# Patient Record
Sex: Female | Born: 2000 | Race: White | Hispanic: No | Marital: Single | Smoking: Never smoker
Health system: Southern US, Community
[De-identification: ages and names within clinical notes are randomized; demographics above are authoritative.]

## PROBLEM LIST (undated history)

## (undated) ENCOUNTER — Inpatient Hospital Stay (HOSPITAL_COMMUNITY): Payer: Self-pay

## (undated) DIAGNOSIS — N1370 Vesicoureteral-reflux, unspecified: Secondary | ICD-10-CM

## (undated) DIAGNOSIS — B009 Herpesviral infection, unspecified: Secondary | ICD-10-CM

## (undated) DIAGNOSIS — F32A Depression, unspecified: Secondary | ICD-10-CM

## (undated) DIAGNOSIS — N200 Calculus of kidney: Secondary | ICD-10-CM

## (undated) DIAGNOSIS — F909 Attention-deficit hyperactivity disorder, unspecified type: Secondary | ICD-10-CM

## (undated) DIAGNOSIS — R569 Unspecified convulsions: Secondary | ICD-10-CM

## (undated) DIAGNOSIS — F329 Major depressive disorder, single episode, unspecified: Secondary | ICD-10-CM

## (undated) DIAGNOSIS — D649 Anemia, unspecified: Secondary | ICD-10-CM

## (undated) HISTORY — DX: Attention-deficit hyperactivity disorder, unspecified type: F90.9

## (undated) HISTORY — DX: Vesicoureteral-reflux, unspecified: N13.70

## (undated) HISTORY — PX: TONSILLECTOMY: SUR1361

## (undated) HISTORY — DX: Depression, unspecified: F32.A

## (undated) HISTORY — PX: FOOT SURGERY: SHX648

## (undated) HISTORY — DX: Herpesviral infection, unspecified: B00.9

## (undated) HISTORY — PX: OTHER SURGICAL HISTORY: SHX169

---
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
## 2021-01-07 ENCOUNTER — Telehealth: Payer: Self-pay

## 2021-01-07 ENCOUNTER — Other Ambulatory Visit: Payer: Self-pay | Admitting: Family Medicine

## 2021-01-07 ENCOUNTER — Other Ambulatory Visit: Payer: Self-pay

## 2021-01-07 ENCOUNTER — Ambulatory Visit: Payer: Medicaid Other | Attending: Family Medicine

## 2021-01-07 ENCOUNTER — Encounter: Payer: Self-pay | Admitting: Family Medicine

## 2021-01-07 DIAGNOSIS — Z3480 Encounter for supervision of other normal pregnancy, unspecified trimester: Secondary | ICD-10-CM

## 2021-01-07 NOTE — Telephone Encounter (Signed)
Patient calls nurse line requesting letter from PCP for clearance to receive dental X-ray. Patient is concerned she may have a cavity. Patient has upcoming dental appointment on Monday and they will not perform imaging without clearance from PCP.   Please advise.   Veronda Prude, RN

## 2021-01-07 NOTE — Telephone Encounter (Signed)
Letter in the letters tab per acog guidelines.  Please let patient know.

## 2021-01-10 ENCOUNTER — Other Ambulatory Visit: Payer: Self-pay | Admitting: *Deleted

## 2021-01-10 DIAGNOSIS — O43122 Velamentous insertion of umbilical cord, second trimester: Secondary | ICD-10-CM

## 2021-01-10 NOTE — Telephone Encounter (Signed)
Patient came to front office and provided with letter.   Veronda Prude, RN

## 2021-01-11 ENCOUNTER — Telehealth: Payer: Self-pay | Admitting: Family Medicine

## 2021-01-11 DIAGNOSIS — O283 Abnormal ultrasonic finding on antenatal screening of mother: Secondary | ICD-10-CM | POA: Insufficient documentation

## 2021-01-11 DIAGNOSIS — O43129 Velamentous insertion of umbilical cord, unspecified trimester: Secondary | ICD-10-CM | POA: Insufficient documentation

## 2021-01-11 DIAGNOSIS — O43122 Velamentous insertion of umbilical cord, second trimester: Secondary | ICD-10-CM

## 2021-01-11 NOTE — Telephone Encounter (Signed)
Attempted to call patient as she was recently seen by maternal fetal medicine and referred to [facility] for fetal echo.  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  Will route to FM-OB provider who is seeing patient at upcoming appointment.  I was unable to leave a voicemail as patient's voicemail not set up.

## 2021-01-13 ENCOUNTER — Other Ambulatory Visit (HOSPITAL_COMMUNITY)
Admission: RE | Admit: 2021-01-13 | Discharge: 2021-01-13 | Disposition: A | Discharge status: Home / Self Care | Payer: Medicaid Other | Source: Ambulatory Visit | Attending: Family Medicine | Admitting: Family Medicine

## 2021-01-13 ENCOUNTER — Ambulatory Visit (INDEPENDENT_AMBULATORY_CARE_PROVIDER_SITE_OTHER): Payer: Medicaid Other | Admitting: Family Medicine

## 2021-01-13 ENCOUNTER — Other Ambulatory Visit: Payer: Self-pay

## 2021-01-13 VITALS — BP 122/73 | HR 94 | Wt 199.6 lb

## 2021-01-13 DIAGNOSIS — O23592 Infection of other part of genital tract in pregnancy, second trimester: Secondary | ICD-10-CM

## 2021-01-13 DIAGNOSIS — O99012 Anemia complicating pregnancy, second trimester: Secondary | ICD-10-CM

## 2021-01-13 DIAGNOSIS — A5901 Trichomonal vulvovaginitis: Secondary | ICD-10-CM

## 2021-01-13 DIAGNOSIS — Z3480 Encounter for supervision of other normal pregnancy, unspecified trimester: Secondary | ICD-10-CM

## 2021-01-13 DIAGNOSIS — Z23 Encounter for immunization: Secondary | ICD-10-CM | POA: Diagnosis present

## 2021-01-13 DIAGNOSIS — R8271 Bacteriuria: Secondary | ICD-10-CM

## 2021-01-13 NOTE — Progress Notes (Signed)
[facility] Visit  Alyssa Howell is a 20 y.o. G1P0 at [redacted]w[redacted]d (via LMP) who presents to [facility] for routine follow up. Seen today along with medical student Alease Medina. Prenatal course, history, notes, ultrasounds, and laboratory results reviewed.  Denies cramping/ctx, fluid leaking, vaginal bleeding, or decreased fetal movement. Taking PNV. Forgetting to take aspirin 81 mg and ferrous sulfate regularly.  Reports occasional leakage from breasts bilaterally. Does not happen consistently or continuously. Often doesn't notice until she wakes up and can't tell what consistency it is. No bloody discharge.  Has noticed when she sits for a while she will occasionally have numbness and tingling in both her legs. It typically self-resolves in a few minutes but until it does she cannot bear weight on her legs. It does not bother her otherwise.  Primary Prenatal Care Provider: Dr. Anner Crete  Postpartum Plans: - delivery planning: Prefers SVD but was informed by MFM that C-section was advisable due to high risk of hemorrhage with velamentous cord insertion. - circumcision: Yes - feeding: Breast feeding - pediatrician: [facility] - contraception: Unsure. Has not had good experiences with nexplanon or OCP. Discussed possible postplacental IUD.  FHR: 150 Uterine size: 24cm  Breasts: bilateral breasts normal in appearance. No erythema, deformity, or nipple discharge. No palpable abnormal masses. No axillary lymphadenopathy. Chaperone present - Maree Erie CMA MSK: 5/5 strength, sensation and motor intact. 2+ reflexes on lower extremities bilaterally.  Assessment & Plan  1. Routine prenatal care: - flu shot given today - encouraged bivalent COVID booster, patient will consider - reassured patient re: leaking of breasts, likely colostrum, exam normal - encouraged adherence with baby aspirin for pre-E prophylaxis  2. Anemia of pregnancy: Hgb 9.5 on  8/6 - encouraged adherence with iron - check CBC & ferritin today  3. Velamentous cord insertion - getting serial growth scans with MFM, potentially will need delivery via CS - to be determined based on MFM recommendations  4. 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 Answered questions as I was able, advised she will get a lot more information at Tuesday's echo appointment.   5. Trichomonas infection - positive for trich on 8/2, s/p treatment - blind gc/chl/trich swab obtained today as test of cure  6. Leg numbness - does not seem anything severe; only with sitting, improves with standing. Patient sat through entirety of appointment but had no numbness during examination. Suspect some nerve impingement, but no concerns for cauda equina at this time given reassuring exam and lack of bladder/bowel incontinence. Patient requests letter giving her permission to sit as needed at work, which I have provided.  7. History of depression - patient denies mood concerns at present. Continue to monitor, especially postpartum.  8. History of nephrolithiasis - caused frequent MAU/ED visits earlier in pregnancy, most recently has not been a problem. Continue to monitor.  9. GBS bacteriuria - will need intrapartum ppx if vaginal delivery  Next prenatal visit in 3 weeks. Will need 1hr GTT, CBC, HIV, RPR drawn at that visit. Labor & fetal movement precautions discussed.  Patient seen along with MS3 student Alease Medina. I personally evaluated this patient along with the student, and verified all aspects of the history, physical exam, and medical decision making as documented by the student. I agree with the student's documentation and have made all necessary edits.  Levert Feinstein, MD  [facility]

## 2021-01-13 NOTE — Patient Instructions (Signed)
It was nice to meet you today!  Go to the MAU at [facility] if: You have cramping/contractions that do not go away with drinking water Your water breaks.  Sometimes it is a big gush of fluid, sometimes it is just a trickle that keeps getting your underwear wet or running down your legs You have vaginal bleeding.    You do not feel your baby moving like normal.  If you do not, get something to eat and drink and lay down and focus on feeling your baby move. If your baby is still not moving like normal, you should go to MAU.   Testing today for infections Letter given so you can sit at work Testing for anemia Flu shot today  Next visit in 3 weeks - will do diabetes test at that visit  Be well, Dr. Pollie Meyer

## 2021-01-14 LAB — CBC
Hematocrit: 32.6 % — ABNORMAL LOW (ref 34.0–46.6)
Hemoglobin: 10.8 g/dL — ABNORMAL LOW (ref 11.1–15.9)
MCH: 32.4 pg (ref 26.6–33.0)
MCHC: 33.1 g/dL (ref 31.5–35.7)
MCV: 98 fL — ABNORMAL HIGH (ref 79–97)
Platelets: 281 10*3/uL (ref 150–450)
RBC: 3.33 x10E6/uL — ABNORMAL LOW (ref 3.77–5.28)
RDW: 11.8 % (ref 11.7–15.4)
WBC: 11.8 10*3/uL — ABNORMAL HIGH (ref 3.4–10.8)

## 2021-01-14 LAB — CERVICOVAGINAL ANCILLARY ONLY
Chlamydia: NEGATIVE
Comment: NEGATIVE
Comment: NEGATIVE
Comment: NORMAL
Neisseria Gonorrhea: NEGATIVE
Trichomonas: NEGATIVE

## 2021-01-14 LAB — FERRITIN: Ferritin: 17 ng/mL (ref 15–77)

## 2021-01-21 ENCOUNTER — Telehealth: Payer: Self-pay

## 2021-01-21 DIAGNOSIS — R8271 Bacteriuria: Secondary | ICD-10-CM | POA: Insufficient documentation

## 2021-01-21 NOTE — Telephone Encounter (Signed)
Patient appt at [facility] is scheduled for 03/03/2021 @10 :00am

## 2021-01-22 ENCOUNTER — Encounter: Payer: Self-pay | Admitting: Family Medicine

## 2021-02-01 ENCOUNTER — Encounter: Payer: Self-pay | Admitting: Family Medicine

## 2021-02-01 NOTE — Telephone Encounter (Signed)
Called patient regarding mychart message. Due to severity of headache and visual changes, recommended MAU evaluation. Patient verbalizes understanding. FYI to PCP.   Veronda Prude, RN

## 2021-02-04 ENCOUNTER — Ambulatory Visit (INDEPENDENT_AMBULATORY_CARE_PROVIDER_SITE_OTHER): Payer: Medicaid Other | Admitting: Family Medicine

## 2021-02-04 ENCOUNTER — Other Ambulatory Visit: Payer: Self-pay

## 2021-02-04 VITALS — BP 122/70 | HR 90 | Wt 201.0 lb

## 2021-02-04 DIAGNOSIS — O43123 Velamentous insertion of umbilical cord, third trimester: Secondary | ICD-10-CM

## 2021-02-04 DIAGNOSIS — O283 Abnormal ultrasonic finding on antenatal screening of mother: Secondary | ICD-10-CM

## 2021-02-04 DIAGNOSIS — Z23 Encounter for immunization: Secondary | ICD-10-CM

## 2021-02-04 DIAGNOSIS — Z3480 Encounter for supervision of other normal pregnancy, unspecified trimester: Secondary | ICD-10-CM

## 2021-02-04 NOTE — Patient Instructions (Signed)
Thank you for coming in today.  Please continue to take your prenatal vitamin. Follow up in 2 weeks. Come back next Tuesday to get your 1 hour glucose test done.

## 2021-02-04 NOTE — Progress Notes (Signed)
  [facility] Prenatal Visit  Alyssa Howell is a 20 y.o. G1P0 at [redacted]w[redacted]d here for routine follow up. She is dated by LMP.  She reports no complaints. She reports fetal movement. She denies vaginal bleeding, contractions, or loss of fluid. See flow sheet for details.  Vitals:   02/04/21 1228  BP: 122/70  Pulse: 90      A/P: Pregnancy at [redacted]w[redacted]d.  Doing well.   Routine prenatal care:  Dating reviewed, dating tab is correct Fetal heart tones Appropriate Fundal height within expected range.  Infant feeding choice: Breastfeeding Contraception choice: Pills  Infant circumcision desired yes  The patient does not have a history of Cesarean delivery and no referral to Center for [facility] is indicated Influenza vaccine previously administered.   Tdap was given today. CBC, RPR, and HIV were obtained today.   1 hour glucola to be obtained 10/18; future order placed  Rh status was reviewed and patient does not need Rhogam.  Rhogam was not given today.  Pregnancy medical home and PHQ-9 forms were not done today and reviewed.   Childbirth and education classes were not offered. Pregnancy education regarding benefits of breastfeeding, contraception, fetal growth, expected weight gain, and safe infant sleep were discussed.  Preterm labor and fetal movement precautions reviewed.  2. Pregnancy issues include the following and were addressed as appropriate today:   Frequent headaches- denies vision changes or headache at this time. Discussed increased hydration. No hx of RUQ pain, nausea or vomiting. 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 Potentially needs to be delivered at [facility] according to patient, she is unsure at this time. Will have more information at next appointment.   Problem list and pregnancy box updated: Yes.   Patient scheduled in [facility] during third trimester on 02/25/21.   Follow up 2 weeks.

## 2021-02-05 ENCOUNTER — Other Ambulatory Visit: Payer: Self-pay

## 2021-02-05 ENCOUNTER — Inpatient Hospital Stay (HOSPITAL_COMMUNITY)
Admission: AD | Admit: 2021-02-05 | Discharge: 2021-02-05 | Disposition: A | Discharge status: Home / Self Care | Payer: Medicaid Other | Attending: Family Medicine | Admitting: Family Medicine

## 2021-02-05 ENCOUNTER — Inpatient Hospital Stay (HOSPITAL_COMMUNITY): Payer: Medicaid Other

## 2021-02-05 ENCOUNTER — Encounter (HOSPITAL_COMMUNITY): Payer: Self-pay | Admitting: Family Medicine

## 2021-02-05 DIAGNOSIS — O26833 Pregnancy related renal disease, third trimester: Secondary | ICD-10-CM | POA: Diagnosis not present

## 2021-02-05 DIAGNOSIS — M549 Dorsalgia, unspecified: Secondary | ICD-10-CM | POA: Diagnosis not present

## 2021-02-05 DIAGNOSIS — O99891 Other specified diseases and conditions complicating pregnancy: Secondary | ICD-10-CM

## 2021-02-05 DIAGNOSIS — Z87891 Personal history of nicotine dependence: Secondary | ICD-10-CM | POA: Insufficient documentation

## 2021-02-05 DIAGNOSIS — Z3A28 28 weeks gestation of pregnancy: Secondary | ICD-10-CM

## 2021-02-05 DIAGNOSIS — R109 Unspecified abdominal pain: Secondary | ICD-10-CM | POA: Insufficient documentation

## 2021-02-05 DIAGNOSIS — N200 Calculus of kidney: Secondary | ICD-10-CM

## 2021-02-05 DIAGNOSIS — Z3689 Encounter for other specified antenatal screening: Secondary | ICD-10-CM

## 2021-02-05 LAB — CBC
Hematocrit: 29.9 % — ABNORMAL LOW (ref 34.0–46.6)
Hemoglobin: 10.6 g/dL — ABNORMAL LOW (ref 11.1–15.9)
MCH: 33.9 pg — ABNORMAL HIGH (ref 26.6–33.0)
MCHC: 35.5 g/dL (ref 31.5–35.7)
MCV: 96 fL (ref 79–97)
Platelets: 244 10*3/uL (ref 150–450)
RBC: 3.13 x10E6/uL — ABNORMAL LOW (ref 3.77–5.28)
RDW: 11.4 % — ABNORMAL LOW (ref 11.7–15.4)
WBC: 11.5 10*3/uL — ABNORMAL HIGH (ref 3.4–10.8)

## 2021-02-05 LAB — URINALYSIS, ROUTINE W REFLEX MICROSCOPIC
Bilirubin Urine: NEGATIVE
Glucose, UA: NEGATIVE mg/dL
Hgb urine dipstick: NEGATIVE
Ketones, ur: NEGATIVE mg/dL
Nitrite: NEGATIVE
Protein, ur: NEGATIVE mg/dL
Specific Gravity, Urine: 1.019 (ref 1.005–1.030)
pH: 5 (ref 5.0–8.0)

## 2021-02-05 LAB — HIV ANTIBODY (ROUTINE TESTING W REFLEX): HIV Screen 4th Generation wRfx: NONREACTIVE

## 2021-02-05 LAB — RPR: RPR Ser Ql: NONREACTIVE

## 2021-02-05 MED ORDER — TAMSULOSIN HCL 0.4 MG PO CAPS: 0.4000 mg | ORAL_CAPSULE | Freq: Every day | ORAL | 0 refills | Status: DC

## 2021-02-05 MED ORDER — ACETAMINOPHEN-CODEINE #3 300-30 MG PO TABS: 1.0000 | ORAL_TABLET | Freq: Four times a day (QID) | ORAL | 0 refills | Status: DC | PRN

## 2021-02-05 MED ORDER — ACETAMINOPHEN-CODEINE #3 300-30 MG PO TABS: 2.0000 | ORAL_TABLET | Freq: Once | ORAL | Status: DC

## 2021-02-05 MED ORDER — OXYCODONE HCL 5 MG PO TABS
5.0000 mg | ORAL_TABLET | Freq: Once | ORAL | Status: AC
  Administered 2021-02-05: 5 mg via ORAL
  Filled 2021-02-05: qty 1

## 2021-02-05 MED ORDER — ACETAMINOPHEN 500 MG PO TABS
1000.0000 mg | ORAL_TABLET | Freq: Once | ORAL | Status: AC
  Administered 2021-02-05: 1000 mg via ORAL
  Filled 2021-02-05: qty 2

## 2021-02-05 NOTE — MAU Note (Signed)
Thinks she is getting another kidney stone on the rt side.(Rt flank pain around to front)  Started about 3 days ago.  Been having headaches the past wk, only took something 1 time, that was earlier today.  Had some blurred vision, seems to go with headaches and she gets super dizzy.

## 2021-02-05 NOTE — MAU Note (Signed)
Patient presents to MAU with pain of 7/10 on the right side radiating towards her back that she stated started "two or three days ago". Patient also states that this pain gets worse and "shoots towards the groin" when urinating. She also has been having headaches to the point of dizziness that started last week.

## 2021-02-05 NOTE — MAU Provider Note (Addendum)
History     CSN: [id number]  Arrival date and time: 02/05/21 1806   Event Date/Time   First Provider Initiated Contact with Patient 02/05/21 2034      Chief Complaint  Patient presents with   Headache   Blurred Vision   Dizziness   possible kidney stone   HPI Alyssa Howell is a 20 y.o. G1P0 at [redacted]w[redacted]d who presents with right flank pain. She states it started 2 days ago and it is constant. She rates the pain a 5/10 and has not taken anything for the pain. She states she has had kidney stones during the pregnancy and this pain is the same. She also reports having frequent headaches. Sometimes they cause blurred vision and sometimes they cause dizziness. She does not have any of the symptoms now. She reports eating well and drinking 2 bottles of water today. She denies any leaking or bleeding. Reports normal fetal movement.   OB History     Gravida  1   Para      Term      Preterm      AB      Living         SAB      IAB      Ectopic      Multiple      Live Births              Past Medical History:  Diagnosis Date   ADHD    Depression    Vesico-ureteral reflux     Past Surgical History:  Procedure Laterality Date   CYSTOSCOPY/URETEROSCOPY/HOLMIUM LASER/STENT PLACEMENT Right 08/02/2020   Procedure: CYSTOSCOPY/URETEROSCOPY/HOLMIUM LASER/STENT PLACEMENT;  Surgeon: Vanna Scotland, MD;  Location: [facility] ORS;  Service: Urology;  Laterality: Right;   TONSILLECTOMY      Family History  Problem Relation Age of Onset   Hepatitis C Mother    Valvular heart disease Mother    Healthy Father    Breast cancer Maternal Grandmother    Diabetes Other    Heart attack Other     Social History   Tobacco Use   Smoking status: Former    Types: E-cigarettes   Smokeless tobacco: Never  Vaping Use   Vaping Use: Never used  Substance Use Topics   Alcohol use: Not Currently    Comment: 1-2 times per month    Drug use: Not Currently    Types: Marijuana     Allergies: No Known Allergies  Medications Prior to Admission  Medication Sig Dispense Refill Last Dose   aspirin EC 81 MG tablet Take 1 tablet (81 mg total) by mouth daily. Swallow whole. (Patient not taking: Reported on 01/13/2021) 30 tablet 11    ferrous sulfate 325 (65 FE) MG tablet Take 1 tablet (325 mg total) by mouth daily with breakfast. (Patient not taking: Reported on 01/13/2021) 90 tablet 1    Prenatal Vit-Fe Fumarate-FA (PRENATAL MULTIVITAMIN) TABS tablet Take 1 tablet by mouth daily at 12 noon. 90 tablet 2     Review of Systems  Constitutional: Negative.  Negative for fatigue and fever.  HENT: Negative.    Respiratory: Negative.  Negative for shortness of breath.   Cardiovascular: Negative.  Negative for chest pain.  Gastrointestinal:  Positive for abdominal pain. Negative for constipation, diarrhea, nausea and vomiting.  Genitourinary:  Positive for flank pain. Negative for dysuria, vaginal bleeding and vaginal discharge.  Neurological: Negative.  Negative for dizziness and headaches.  Physical Exam   Blood pressure 104/81,  pulse (!) 102, temperature 98.8 F (37.1 C), temperature source Oral, resp. rate 18, height 5\' 6"  (1.676 m), weight 94.3 kg, last menstrual period 07/23/2020, SpO2 99 %.  Physical Exam Vitals and nursing note reviewed.  Constitutional:      General: She is not in acute distress.    Appearance: She is well-developed.  HENT:     Head: Normocephalic.  Eyes:     Pupils: Pupils are equal, round, and reactive to light.  Cardiovascular:     Rate and Rhythm: Normal rate and regular rhythm.     Heart sounds: Normal heart sounds.  Pulmonary:     Effort: Pulmonary effort is normal. No respiratory distress.     Breath sounds: Normal breath sounds.  Abdominal:     General: Bowel sounds are normal. There is no distension.     Palpations: Abdomen is soft.     Tenderness: There is no abdominal tenderness. There is right CVA tenderness.  Skin:     General: Skin is warm and dry.  Neurological:     Mental Status: She is alert and oriented to person, place, and time.  Psychiatric:        Mood and Affect: Mood normal.        Behavior: Behavior normal.        Thought Content: Thought content normal.        Judgment: Judgment normal.   Fetal Tracing:  Baseline: 150 Variability: moderate Accels: 10x10 Decels: none  Toco: none   MAU Course  Procedures Results for orders placed or performed during the hospital encounter of 02/05/21 (from the past 24 hour(s))  Urinalysis, Routine w reflex microscopic Urine, Clean Catch     Status: Abnormal   Collection Time: 02/05/21  6:46 PM  Result Value Ref Range   Color, Urine YELLOW YELLOW   APPearance HAZY (A) CLEAR   Specific Gravity, Urine 1.019 1.005 - 1.030   pH 5.0 5.0 - 8.0   Glucose, UA NEGATIVE NEGATIVE mg/dL   Hgb urine dipstick NEGATIVE NEGATIVE   Bilirubin Urine NEGATIVE NEGATIVE   Ketones, ur NEGATIVE NEGATIVE mg/dL   Protein, ur NEGATIVE NEGATIVE mg/dL   Nitrite NEGATIVE NEGATIVE   Leukocytes,Ua SMALL (A) NEGATIVE   RBC / HPF 6-10 0 - 5 RBC/hpf   WBC, UA 6-10 0 - 5 WBC/hpf   Bacteria, UA RARE (A) NONE SEEN   Squamous Epithelial / LPF 11-20 0 - 5   Mucus PRESENT     02/07/21 Renal  Result Date: 02/05/2021 CLINICAL DATA:  Right flank pain.  Twenty-eight weeks pregnant. EXAM: RENAL / URINARY TRACT ULTRASOUND COMPLETE COMPARISON:  11/27/2020 FINDINGS: Right Kidney: Renal measurements: 11.9 x 4.2 x 5.8 cm = volume: 152 mL. Multiple shadowing stones, the largest 7 mm in the upper pole. No hydronephrosis or mass. Normal echotexture. Left Kidney: Renal measurements: 11.2 x 6.2 x 5.3 cm = volume: 192 mL. Multiple stones measuring up to 6 mm. No hydronephrosis. Normal echotexture. No mass. Bladder: Appears normal for degree of bladder distention. Other: None. IMPRESSION: Bilateral nephrolithiasis.  No hydronephrosis. Electronically Signed   By: 01/27/2021 M.D.   On: 02/05/2021 21:47      MDM UA 02/07/2021 Renal Tylenol PO  Care turned over to [place] CNM at 2100.  2101, CNM 02/05/21 8:57 PM   Assessment and Plan   Reassessment (10:10 PM)  Results return positive for bilateral stones. Provider to bedside to discuss results. Patient informed of medications for management. Rx  for flomax and tylenol 3 sent to pharmacy on file.  Patient requests pain medication prior to discharge. Will give oxycodone 5mg  now. Patient reports she has all supplies necessary  No questions or concerns. Instructed to follow up with urologist and primary office. Encouraged to call primary office or return to MAU if symptoms worsen or with the onset of new symptoms. Discharged to home in stable condition.  MSN, CNM Advanced Practice Provider, [facility]

## 2021-02-07 ENCOUNTER — Encounter (HOSPITAL_COMMUNITY): Payer: Self-pay | Admitting: Family Medicine

## 2021-02-07 ENCOUNTER — Inpatient Hospital Stay (HOSPITAL_COMMUNITY): Payer: Medicaid Other

## 2021-02-07 ENCOUNTER — Encounter (HOSPITAL_COMMUNITY)
Admission: AD | Disposition: A | Discharge status: Home / Self Care | Payer: Self-pay | Source: Home / Self Care | Attending: Family Medicine

## 2021-02-07 ENCOUNTER — Inpatient Hospital Stay (HOSPITAL_COMMUNITY): Payer: Medicaid Other | Admitting: Certified Registered"

## 2021-02-07 ENCOUNTER — Inpatient Hospital Stay (HOSPITAL_COMMUNITY)
Admission: AD | Admit: 2021-02-07 | Discharge: 2021-02-07 | Disposition: A | Discharge status: Home / Self Care | Payer: Medicaid Other | Attending: Family Medicine | Admitting: Family Medicine

## 2021-02-07 DIAGNOSIS — Z538 Procedure and treatment not carried out for other reasons: Secondary | ICD-10-CM | POA: Insufficient documentation

## 2021-02-07 DIAGNOSIS — O43123 Velamentous insertion of umbilical cord, third trimester: Secondary | ICD-10-CM | POA: Diagnosis not present

## 2021-02-07 DIAGNOSIS — Z20822 Contact with and (suspected) exposure to covid-19: Secondary | ICD-10-CM | POA: Diagnosis not present

## 2021-02-07 DIAGNOSIS — Z419 Encounter for procedure for purposes other than remedying health state, unspecified: Secondary | ICD-10-CM

## 2021-02-07 DIAGNOSIS — Z87442 Personal history of urinary calculi: Secondary | ICD-10-CM | POA: Diagnosis not present

## 2021-02-07 DIAGNOSIS — N200 Calculus of kidney: Secondary | ICD-10-CM | POA: Insufficient documentation

## 2021-02-07 DIAGNOSIS — O26833 Pregnancy related renal disease, third trimester: Secondary | ICD-10-CM | POA: Diagnosis present

## 2021-02-07 DIAGNOSIS — Z79899 Other long term (current) drug therapy: Secondary | ICD-10-CM | POA: Diagnosis not present

## 2021-02-07 DIAGNOSIS — Z3A28 28 weeks gestation of pregnancy: Secondary | ICD-10-CM

## 2021-02-07 DIAGNOSIS — Z87891 Personal history of nicotine dependence: Secondary | ICD-10-CM | POA: Diagnosis not present

## 2021-02-07 LAB — URINALYSIS, ROUTINE W REFLEX MICROSCOPIC
Bacteria, UA: NONE SEEN
Bilirubin Urine: NEGATIVE
Glucose, UA: NEGATIVE mg/dL
Ketones, ur: NEGATIVE mg/dL
Nitrite: NEGATIVE
Protein, ur: NEGATIVE mg/dL
Specific Gravity, Urine: 1.013 (ref 1.005–1.030)
Squamous Epithelial / HPF: >50 — ABNORMAL HIGH (ref 0–5)
WBC, UA: >50 WBC/hpf — ABNORMAL HIGH (ref 0–5)
pH: 6 (ref 5.0–8.0)

## 2021-02-07 LAB — CBC
HCT: 33.5 % — ABNORMAL LOW (ref 36.0–46.0)
Hemoglobin: 11 g/dL — ABNORMAL LOW (ref 12.0–15.0)
MCH: 33.5 pg (ref 26.0–34.0)
MCHC: 32.8 g/dL (ref 30.0–36.0)
MCV: 102.1 fL — ABNORMAL HIGH (ref 80.0–100.0)
Platelets: 241 10*3/uL (ref 150–400)
RBC: 3.28 MIL/uL — ABNORMAL LOW (ref 3.87–5.11)
RDW: 12.3 % (ref 11.5–15.5)
WBC: 12.5 10*3/uL — ABNORMAL HIGH (ref 4.0–10.5)
nRBC: 0 % (ref 0.0–0.2)

## 2021-02-07 LAB — COMPREHENSIVE METABOLIC PANEL
ALT: 11 U/L (ref 0–44)
AST: 17 U/L (ref 15–41)
Albumin: 2.8 g/dL — ABNORMAL LOW (ref 3.5–5.0)
Alkaline Phosphatase: 81 U/L (ref 38–126)
Anion gap: 9 (ref 5–15)
BUN: 9 mg/dL (ref 6–20)
CO2: 23 mmol/L (ref 22–32)
Calcium: 9 mg/dL (ref 8.9–10.3)
Chloride: 105 mmol/L (ref 98–111)
Creatinine, Ser: 0.74 mg/dL (ref 0.44–1.00)
GFR, Estimated: >60 mL/min (ref >60)
Glucose, Bld: 82 mg/dL (ref 70–99)
Potassium: 4 mmol/L (ref 3.5–5.1)
Sodium: 137 mmol/L (ref 135–145)
Total Bilirubin: 0.3 mg/dL (ref 0.3–1.2)
Total Protein: 6.5 g/dL (ref 6.5–8.1)

## 2021-02-07 LAB — RESP PANEL BY RT-PCR (FLU A&B, COVID) ARPGX2
Influenza A by PCR: NEGATIVE
Influenza B by PCR: NEGATIVE
SARS Coronavirus 2 by RT PCR: NEGATIVE

## 2021-02-07 SURGERY — CANCELLED PROCEDURE
Anesthesia: General | Laterality: Left

## 2021-02-07 MED ORDER — ONDANSETRON HCL 4 MG/2ML IJ SOLN
4.0000 mg | Freq: Once | INTRAMUSCULAR | Status: AC
  Administered 2021-02-07: 4 mg via INTRAVENOUS
  Filled 2021-02-07: qty 2

## 2021-02-07 MED ORDER — OXYCODONE-ACETAMINOPHEN 5-325 MG PO TABS: 1.0000 | ORAL_TABLET | Freq: Four times a day (QID) | ORAL | 0 refills | Status: DC | PRN

## 2021-02-07 MED ORDER — LACTATED RINGERS IV SOLN: INTRAVENOUS | Status: DC

## 2021-02-07 MED ORDER — ONDANSETRON HCL 4 MG PO TABS: 4.0000 mg | ORAL_TABLET | Freq: Three times a day (TID) | ORAL | 1 refills | Status: DC | PRN

## 2021-02-07 MED ORDER — ONDANSETRON HCL 4 MG/2ML IJ SOLN: 4.0000 mg | Freq: Once | INTRAMUSCULAR | Status: AC

## 2021-02-07 MED ORDER — MIDAZOLAM HCL 2 MG/2ML IJ SOLN
INTRAMUSCULAR | Status: AC
  Filled 2021-02-07: qty 2

## 2021-02-07 MED ORDER — FENTANYL CITRATE (PF) 100 MCG/2ML IJ SOLN
INTRAMUSCULAR | Status: AC
  Administered 2021-02-07: 50 ug via INTRAVENOUS
  Filled 2021-02-07: qty 2

## 2021-02-07 MED ORDER — LACTATED RINGERS IV BOLUS
1000.0000 mL | Freq: Once | INTRAVENOUS | Status: AC
  Administered 2021-02-07: 1000 mL via INTRAVENOUS

## 2021-02-07 MED ORDER — FENTANYL CITRATE (PF) 100 MCG/2ML IJ SOLN: 50.0000 ug | INTRAMUSCULAR | Status: DC | PRN

## 2021-02-07 MED ORDER — ONDANSETRON HCL 4 MG/2ML IJ SOLN
INTRAMUSCULAR | Status: AC
  Administered 2021-02-07: 4 mg via INTRAVENOUS
  Filled 2021-02-07: qty 2

## 2021-02-07 MED ORDER — HYDROMORPHONE HCL 1 MG/ML IJ SOLN
1.0000 mg | Freq: Once | INTRAMUSCULAR | Status: AC
Start: 2021-02-07 — End: 2021-02-07
  Administered 2021-02-07: 1 mg via INTRAVENOUS
  Filled 2021-02-07: qty 1

## 2021-02-07 MED ORDER — FENTANYL CITRATE (PF) 250 MCG/5ML IJ SOLN
INTRAMUSCULAR | Status: AC
  Filled 2021-02-07: qty 5

## 2021-02-07 NOTE — MAU Provider Note (Addendum)
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 She was seen here 2 days ago, diagnosed with nonobstructive nephrolithiasis and sent home on pain medication and flomax. She returns today with increasing pain and  vomiting. She has not been able to keep down any foods or liquids this morning. Pain comes and goes in waves. Denies fevers, chills. Pain is located in her lower thoracic area.  OB History     Gravida  1   Para      Term      Preterm      AB      Living         SAB      IAB      Ectopic      Multiple      Live Births              Past Medical History:  Diagnosis Date   ADHD    Depression    Vesico-ureteral reflux     Past Surgical History:  Procedure Laterality Date   CYSTOSCOPY/URETEROSCOPY/HOLMIUM LASER/STENT PLACEMENT Right 08/02/2020   Procedure: CYSTOSCOPY/URETEROSCOPY/HOLMIUM LASER/STENT PLACEMENT;  Surgeon: Vanna Scotland, MD;  Location: [facility] ORS;  Service: Urology;  Laterality: Right;   TONSILLECTOMY      Family History  Problem Relation Age of Onset   Hepatitis C Mother    Valvular heart disease Mother    Healthy Father    Breast cancer Maternal Grandmother    Diabetes Other    Heart attack Other     Social History   Tobacco Use   Smoking status: Former    Types: E-cigarettes   Smokeless tobacco: Never  Vaping Use   Vaping Use: Never used  Substance Use Topics   Alcohol use: Not Currently   Drug use: Not Currently    Types: Marijuana    Comment: last used the end of September 2022 as of 02/07/2021    Allergies: No Known Allergies  Medications Prior to Admission  Medication Sig Dispense Refill  Last Dose   acetaminophen-codeine (TYLENOL #3) 300-30 MG tablet Take 1-2 tablets by mouth every 6 (six) hours as needed for moderate pain. 6 tablet 0 02/07/2021 at 0430   ondansetron (ZOFRAN) 8 MG tablet Take by mouth every 8 (eight) hours as needed for nausea or vomiting.   02/06/2021 at 2130   Prenatal Vit-Fe Fumarate-FA (PRENATAL MULTIVITAMIN) TABS tablet Take 1 tablet by mouth daily at 12 noon. 90 tablet 2 02/06/2021   tamsulosin (FLOMAX) 0.4 MG CAPS capsule Take 1 capsule (0.4 mg total) by mouth daily. 30 capsule 0 02/07/2021 at 0430   aspirin EC 81 MG tablet Take 1 tablet (81 mg total) by mouth daily. Swallow whole. (Patient not taking: Reported on 01/13/2021) 30 tablet 11    ferrous sulfate 325 (65 FE) MG tablet Take 1 tablet (325 mg total) by mouth daily with breakfast. (Patient not taking: Reported on 01/13/2021) 90 tablet 1     Review of Systems Physical Exam   Blood pressure (!) 110/56, pulse 78, temperature 98.9 F (37.2 C), temperature source Oral, resp. rate 17, height 5\' 6"  (1.676 m), weight  94.8 kg, last menstrual period 07/23/2020, SpO2 100 %.  Physical Exam Vitals reviewed.  Constitutional:      Appearance: Normal appearance.  Cardiovascular:     Rate and Rhythm: Normal rate and regular rhythm.     Pulses: Normal pulses.  Pulmonary:     Effort: Pulmonary effort is normal.  Abdominal:     General: Abdomen is flat. There is no distension.     Palpations: Abdomen is soft.     Tenderness: There is no abdominal tenderness. There is right CVA tenderness and left CVA tenderness. There is no guarding or rebound.  Skin:    General: Skin is warm and dry.  Neurological:     Mental Status: She is alert.  Psychiatric:        Mood and Affect: Mood normal.        Behavior: Behavior normal.        Thought Content: Thought content normal.        Judgment: Judgment normal.   Results for orders placed or performed during the hospital encounter of 02/07/21 (from the past 24 hour(s))   Urinalysis, Routine w reflex microscopic Urine, Clean Catch     Status: Abnormal   Collection Time: 02/07/21  9:55 AM  Result Value Ref Range   Color, Urine YELLOW YELLOW   APPearance CLOUDY (A) CLEAR   Specific Gravity, Urine 1.013 1.005 - 1.030   pH 6.0 5.0 - 8.0   Glucose, UA NEGATIVE NEGATIVE mg/dL   Hgb urine dipstick SMALL (A) NEGATIVE   Bilirubin Urine NEGATIVE NEGATIVE   Ketones, ur NEGATIVE NEGATIVE mg/dL   Protein, ur NEGATIVE NEGATIVE mg/dL   Nitrite NEGATIVE NEGATIVE   Leukocytes,Ua LARGE (A) NEGATIVE   RBC / HPF 21-50 0 - 5 RBC/hpf   WBC, UA >50 (H) 0 - 5 WBC/hpf   Bacteria, UA NONE SEEN NONE SEEN   Squamous Epithelial / LPF >50 (H) 0 - 5   Mucus PRESENT    Non Squamous Epithelial 0-5 (A) NONE SEEN  Comprehensive metabolic panel     Status: Abnormal   Collection Time: 02/07/21 10:13 AM  Result Value Ref Range   Sodium 137 135 - 145 mmol/L   Potassium 4.0 3.5 - 5.1 mmol/L   Chloride 105 98 - 111 mmol/L   CO2 23 22 - 32 mmol/L   Glucose, Bld 82 70 - 99 mg/dL   BUN 9 6 - 20 mg/dL   Creatinine, Ser 3.08 0.44 - 1.00 mg/dL   Calcium 9.0 8.9 - 65.7 mg/dL   Total Protein 6.5 6.5 - 8.1 g/dL   Albumin 2.8 (L) 3.5 - 5.0 g/dL   AST 17 15 - 41 U/L   ALT 11 0 - 44 U/L   Alkaline Phosphatase 81 38 - 126 U/L   Total Bilirubin 0.3 0.3 - 1.2 mg/dL   GFR, Estimated >84 >69 mL/min   Anion gap 9 5 - 15  CBC     Status: Abnormal   Collection Time: 02/07/21 10:13 AM  Result Value Ref Range   WBC 12.5 (H) 4.0 - 10.5 K/uL   RBC 3.28 (L) 3.87 - 5.11 MIL/uL   Hemoglobin 11.0 (L) 12.0 - 15.0 g/dL   HCT 62.9 (L) 52.8 - 41.3 %   MCV 102.1 (H) 80.0 - 100.0 fL   MCH 33.5 26.0 - 34.0 pg   MCHC 32.8 30.0 - 36.0 g/dL   RDW 24.4 01.0 - 27.2 %   Platelets 241 150 - 400 K/uL  nRBC 0.0 0.0 - 0.2 %   US RENAL  Result Date: 02/07/2021 CLINICAL DATA:  Pain.  History of nephrolithiasis.  Gravid state. EXAM: RENAL / URINARY TRACT ULTRASOUND COMPLETE COMPARISON:  February 05, 2021 FINDINGS:  Right Kidney: Renal measurements: 11.6 x 4.8 x 5.7 cm = volume: 168 mL. There is mild RIGHT-sided pelviectasis. Echogenicity within normal limits. No mass visualized. There are multiple echogenic foci most consistent with nephrolithiasis visualized in the kidney. The largest is approximately 6 mm and is located in the interpolar kidney. Left Kidney: Renal measurements: 10.6 x 6.2 x 5.1 cm = volume: 177 mL. Echogenicity within normal limits. No mass or hydronephrosis visualized. There are multiple echogenic foci most consistent with nephrolithiasis visualized within the kidney, the largest of which measures 6 mm in the interpolar kidney. Bladder: Appears normal for degree of bladder distention. Other: None. IMPRESSION: 1. There is mild RIGHT-sided pelviectasis. No definitive obstructing nephrolithiasis is visualized. This could be due to gravid state. Consider follow-up ultrasound if persistent symptomatology. 2. Multiple bilateral nephrolithiasis. Electronically Signed   By: Meda Klinefelter M.D.   On: 02/07/2021 12:02     MAU Course  Procedures NST:  Baseline: 130  Variability: moderate Accelerations: 10x10, possible 15x15.  Decelerations: none Contractions: none  MDM Discussed patient with Dr Arita Miss with Urology. She offered to place a ureteral stent, which the patient accepted. Pt NPO since 5am. Will prepare patient for OR. Will go home following procedure.  Assessment and Plan   1. [redacted] weeks gestation of pregnancy   2. Nephrolithiasis    To OR  Levie Heritage 02/07/2021, 10:12 AM   After patient went to Short Stay, Dr Arita Miss requested CT scan. CT scan showed no obstructing stone, felt that Stent probably not helpful. Will adjust pain medication, add antiemetics, and follow up outpatient.  Levie Heritage, DO 02/07/2021 4:51 PM

## 2021-02-07 NOTE — MAU Note (Signed)
Short Stay called to inform that transport will be sent for patient as they are ready. Informed RN to call Short Stay RN to give report.  Report given to Short Stay Charge Nurse.

## 2021-02-07 NOTE — MAU Note (Signed)
Was here on Saturday, dx with bilateral kidney stones.  Was given pain medication, but can't keep it down.  The pain is now on both sides.  She was at work this morning and just started throwing up.

## 2021-02-07 NOTE — Progress Notes (Signed)
Received phone call from Dr. Peterson Ao needs CT scan prior to surgery.  OR desk notified. Dr. Arita Miss at bedside, pt agreeable to plan. Dr. Arita Miss filled out informed consent for Low Dose CT scan. Consent at bedside.

## 2021-02-07 NOTE — Progress Notes (Signed)
CT transport here to take pt for scan

## 2021-02-07 NOTE — Progress Notes (Signed)
Surgery not needed per Dr. Arita Miss. Dr. Adrian Blackwater with OB called, discharge instructions provided.  New prescriptions sent directly to pharmacy.  All pt's question's answered. Pt to follow up with urology outpt.  Per Dr. Adrian Blackwater, only a BP needed prior to d/c and does not need any fetal monitoring before leaving.  BP 109/63, pain level 2.  Cup of water and pretzels provided, tolerating PO's.   IV removed. Belongings received.  Mother to transport home.  Pt escorted out via WC.

## 2021-02-07 NOTE — Progress Notes (Signed)
Pt c/o 8/10 R flank pain and nausea. Dr. Bradley Ferris made aware, placing orders.  Awaiting CT results.

## 2021-02-07 NOTE — Consult Note (Signed)
I have been asked to see the patient by Dr. Candelaria Celeste, for evaluation and management of urolithiasis.  History of present illness: 20 year old woman with history of urolithiasis currently [redacted] weeks pregnant presented to [facility] with worsening bilateral flank pain associated with nausea and vomiting.  Renal ultrasound shows mild right pelvic caliectasis and bilateral nonobstructing renal calculi.  Patient reports undergoing ureteroscopy with stent placement in April 2022 by [facility].  She has had symptoms waxing and waning since then.  She was told that she has nonobstructing renal calculi.  The pain medication she is using for kidney stones is causing nausea and vomiting.  No fever or chills.   Review of systems: A 12 point comprehensive review of systems was obtained and is negative unless otherwise stated in the history of present illness.  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   Current Outpatient Medications on File Prior to Encounter  Medication Sig Dispense Refill   acetaminophen-codeine (TYLENOL #3) 300-30 MG tablet Take 1-2 tablets by mouth every 6 (six) hours as needed for moderate pain. 6 tablet 0   ondansetron (ZOFRAN) 8 MG tablet Take by mouth every 8 (eight) hours as needed for nausea or vomiting.     Prenatal Vit-Fe Fumarate-FA (PRENATAL MULTIVITAMIN) TABS tablet Take 1 tablet by mouth daily at 12 noon. 90 tablet 2   tamsulosin (FLOMAX) 0.4 MG CAPS capsule Take 1 capsule (0.4 mg total) by mouth daily. 30 capsule 0    aspirin EC 81 MG tablet Take 1 tablet (81 mg total) by mouth daily. Swallow whole. (Patient not taking: Reported on 01/13/2021) 30 tablet 11   ferrous sulfate 325 (65 FE) MG tablet Take 1 tablet (325 mg total) by mouth daily with breakfast. (Patient not taking: Reported on 01/13/2021) 90 tablet 1    Past Medical History:  Diagnosis Date   ADHD    Depression    Vesico-ureteral reflux     Past Surgical History:  Procedure Laterality Date   CYSTOSCOPY/URETEROSCOPY/HOLMIUM LASER/STENT PLACEMENT Right 08/02/2020   Procedure: CYSTOSCOPY/URETEROSCOPY/HOLMIUM LASER/STENT PLACEMENT;  Surgeon: Vanna Scotland, MD;  Location: [facility] ORS;  Service: Urology;  Laterality: Right;   TONSILLECTOMY      Social History   Tobacco Use   Smoking status: Former    Types: E-cigarettes   Smokeless tobacco: Never  Vaping Use   Vaping Use: Never used  Substance Use Topics   Alcohol use: Not Currently   Drug use: Not Currently    Types: Marijuana    Comment: last used the end of September 2022 as of 02/07/2021    Family History  Problem Relation Age of Onset   Hepatitis C Mother    Valvular heart disease Mother    Healthy Father    Breast cancer Maternal Grandmother    Diabetes Other    Heart attack Other     PE: Vitals:   02/07/21 0930 02/07/21 0952  BP: 110/62 (!) 110/56  Pulse: 82 78  Resp: 17   Temp: 98.9 F (37.2 C)   TempSrc: Oral   SpO2: 100% 100%  Weight: 94.8 kg  Height: 5\' 6"  (1.676 m)    Patient appears to be in no acute distress  patient is alert and oriented x3 Atraumatic normocephalic head No increased work of breathing, no audible wheezes/rhonchi Abdomen is soft, pregnant abdomen, mild right CVA ttp Lower extremities are symmetric without appreciable edema Grossly neurologically intact No identifiable skin lesions  Recent Labs    02/07/21 1013  WBC 12.5*  HGB 11.0*  HCT 33.5*   Recent Labs    02/07/21 1013  NA 137  K 4.0  CL 105  CO2 23  GLUCOSE 82  BUN  9  CREATININE 0.74  CALCIUM 9.0   No results for input(s): LABPT, INR in the last 72 hours. No results for input(s): LABURIN in the last 72 hours. Results for orders placed or performed in visit on 11/23/20  Microscopic Examination     Status: Abnormal   Collection Time: 11/23/20  3:32 PM   Urine  Result Value Ref Range Status   WBC, UA 6-10 (A) 0 - 5 /hpf Final   RBC 11-30 (A) 0 - 2 /hpf Final   Epithelial Cells (non renal) 0-10 0 - 10 /hpf Final   Bacteria, UA None seen None seen/Few Final   Trichomonas, UA Present (A) None seen Final  CULTURE, URINE COMPREHENSIVE     Status: None   Collection Time: 11/23/20  4:20 PM   Specimen: Urine   UR  Result Value Ref Range Status   Urine Culture, Comprehensive Final report  Final   Organism ID, Bacteria Comment  Final    Comment: Mixed urogenital flora 1,000 Colonies/mL     Imaging: Renal 01/23/21 02/07/21 IMPRESSION: 1. There is mild RIGHT-sided pelviectasis. No definitive obstructing nephrolithiasis is visualized. This could be due to gravid state. Consider follow-up ultrasound if persistent symptomatology. 2. Multiple bilateral nephrolithiasis.     Electronically Signed   By: 02/09/21 M.D.   On: 02/07/2021 12:02  CT Abd/Pelvis 02/07/21 IMPRESSION: Patient has multiple small bilateral renal calculi, markedly increased since April of this year when no stones were seen on the right and there was only 1 identifiable stone on the left.   There is no evidence of a stone in the right ureter or the bladder. Phlebolith in the right pelvis was present on the prior exam. Therefore, right renal collecting system and ureter fullness could either be due to extrinsic compression of the ureter by the gravid uterus or could relate to a recently passed stone.     Electronically Signed   By: May M.D.   On: 02/07/2021 16:11  Imp/Recommendations: Urolithiasis: Recommend low-dose CT scan to evaluate for obstructing  ureteral calculi.   -CT the abdomen pelvis did not reveal any ureteral calculi -Patient does have bilateral nonobstructing renal calculi that can be addressed as an outpatient after she delivers the baby -Discussed CT scan findings with patient and that surgery is not indicated in absence of ureteral calculi; stents will not help her in this case and only be a source of encrustation and stone formation -[facility] to be scheduled  with any further questions or concerns. Naoko Diperna D Aerabella Galasso

## 2021-02-07 NOTE — Anesthesia Preprocedure Evaluation (Deleted)
Anesthesia Evaluation    Reviewed: Allergy & Precautions, Patient's Chart, lab work & pertinent test results  Airway        Dental   Pulmonary neg pulmonary ROS, former smoker,           Cardiovascular negative cardio ROS       Neuro/Psych PSYCHIATRIC DISORDERS Depression negative neurological ROS     GI/Hepatic negative GI ROS, Neg liver ROS,   Endo/Other  negative endocrine ROS  Renal/GU negative Renal ROS     Musculoskeletal negative musculoskeletal ROS (+)   Abdominal (+) + obese,   Peds  (+) ADHD Hematology  (+) anemia ,   Anesthesia Other Findings Nephrolithiasis  Reproductive/Obstetrics (+) Pregnancy                             Anesthesia Physical Anesthesia Plan  ASA: 2  Anesthesia Plan: General   Post-op Pain Management:    Induction: Intravenous and Rapid sequence  PONV Risk Score and Plan: 3 and Ondansetron, Dexamethasone and Treatment may vary due to age or medical condition  Airway Management Planned: Oral ETT  Additional Equipment:   Intra-op Plan:   Post-operative Plan: Extubation in OR  Informed Consent:   Plan Discussed with:   Anesthesia Plan Comments:         Anesthesia Quick Evaluation

## 2021-02-08 ENCOUNTER — Other Ambulatory Visit: Payer: Medicaid Other

## 2021-02-08 ENCOUNTER — Ambulatory Visit: Payer: Medicaid Other | Attending: Obstetrics

## 2021-02-08 ENCOUNTER — Ambulatory Visit: Payer: Medicaid Other | Admitting: *Deleted

## 2021-02-08 ENCOUNTER — Other Ambulatory Visit: Payer: Self-pay

## 2021-02-08 ENCOUNTER — Other Ambulatory Visit: Payer: Self-pay | Admitting: *Deleted

## 2021-02-08 ENCOUNTER — Encounter: Payer: Self-pay | Admitting: *Deleted

## 2021-02-08 VITALS — BP 121/72 | HR 89

## 2021-02-08 DIAGNOSIS — Z3480 Encounter for supervision of other normal pregnancy, unspecified trimester: Secondary | ICD-10-CM

## 2021-02-08 DIAGNOSIS — Z3689 Encounter for other specified antenatal screening: Secondary | ICD-10-CM | POA: Insufficient documentation

## 2021-02-08 DIAGNOSIS — Z3A28 28 weeks gestation of pregnancy: Secondary | ICD-10-CM

## 2021-02-08 DIAGNOSIS — E669 Obesity, unspecified: Secondary | ICD-10-CM | POA: Diagnosis not present

## 2021-02-08 DIAGNOSIS — O99213 Obesity complicating pregnancy, third trimester: Secondary | ICD-10-CM

## 2021-02-08 DIAGNOSIS — O43122 Velamentous insertion of umbilical cord, second trimester: Secondary | ICD-10-CM | POA: Diagnosis present

## 2021-02-08 DIAGNOSIS — O43123 Velamentous insertion of umbilical cord, third trimester: Secondary | ICD-10-CM

## 2021-02-09 LAB — GLUCOSE TOLERANCE, 1 HOUR: Glucose, 1Hr PP: 133 mg/dL (ref 70–199)

## 2021-02-25 ENCOUNTER — Other Ambulatory Visit: Payer: Self-pay

## 2021-02-25 ENCOUNTER — Ambulatory Visit (INDEPENDENT_AMBULATORY_CARE_PROVIDER_SITE_OTHER): Payer: Medicaid Other | Admitting: Family Medicine

## 2021-02-25 ENCOUNTER — Encounter: Payer: Self-pay | Admitting: Family Medicine

## 2021-02-25 VITALS — BP 115/74 | HR 110 | Wt 209.0 lb

## 2021-02-25 DIAGNOSIS — Z3480 Encounter for supervision of other normal pregnancy, unspecified trimester: Secondary | ICD-10-CM | POA: Diagnosis not present

## 2021-02-25 DIAGNOSIS — O43123 Velamentous insertion of umbilical cord, third trimester: Secondary | ICD-10-CM

## 2021-02-25 DIAGNOSIS — O283 Abnormal ultrasonic finding on antenatal screening of mother: Secondary | ICD-10-CM

## 2021-02-25 MED ORDER — FAMOTIDINE 40 MG PO TABS: 40.0000 mg | ORAL_TABLET | Freq: Two times a day (BID) | ORAL | 0 refills | Status: DC

## 2021-02-25 NOTE — Assessment & Plan Note (Signed)
Has repeat fetal echocardiogram upcoming. Alyssa Howell would be closer to family for delivery---recommended family discuss with Pediatric Cardiology and MFM. Referred to [facility].

## 2021-02-25 NOTE — Progress Notes (Signed)
  [facility] Prenatal Visit  Alyssa Howell is a 20 y.o. G1P0 at [redacted]w[redacted]d here for routine follow up. She is dated by LMP.  She reports heartburn.  She has stopped taking Tums with her history of kidney stones and wonders what she can take now.  She reports her appetites been okay.  She reports her back pain is improved.  She is concerned about her baby's aortic arch abnormality and her velamentous cord.  Has follow up with Peds Cardiology and MFM. She reports fetal movement. She denies vaginal bleeding, contractions, or loss of fluid.  She has stopped smoking cannabis. She is not taking oxycodone.  See flow sheet for details.  Vitals:   02/25/21 0954  BP: 115/74  Pulse: (!) 110   FHT 145  FH 30 cm    A/P: Pregnancy at [redacted]w[redacted]d.  Doing well.   Routine prenatal care:  Dating reviewed, dating tab is correct Fetal heart tones: Appropriate Fundal height: within expected range.  The patient does not have a history of HSV and valacyclovir is not indicated at this time.  The patient has a velamentous cord and thus was referred to [facility] for recommendations.  Infant feeding choice: Breastfeeding Contraception choice: Pills  Influenza vaccine previously administered.   Tdap was not given today. COVID vaccination was discussed and deferred, continue to discuss .  Pregnancy education regarding benefits of breastfeeding, contraception, fetal growth, expected weight gain, and safe infant sleep were discussed.  Preterm labor and fetal movement precautions reviewed.   2. Pregnancy issues include the following and were addressed as appropriate today: Recommended H2 blocker for GERD. Continue prenatal vitamin.      Velamentous cord insertion-requiring monitoring and growth scans. 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 Discussed at length  with patient and mother. As this can take some time to get a visit, recommended follow up in 2 weeks here for ongoing care and ensure she is NOT lost to follow up. Per discussion with Dr. Pray/Dr. Pollie Meyer, patient should follow up with Ob given above complicatoins.      Problem list and pregnancy box updated: Yes.    Follow up 2 weeks.  Terisa Starr, MD  Family Medicine Teaching Service

## 2021-02-25 NOTE — Patient Instructions (Addendum)
It was wonderful to see you today.  Please bring ALL of your medications with you to every visit.   Today we talked about:  ---Call the High Risk Obstetricians    [phone]   Thank you for choosing [facility].   Please call [phone] with any questions about today's appointment.  Please be sure to schedule follow up at the front  desk before you leave today.   Terisa Starr, MD  Family Medicine

## 2021-02-25 NOTE — Assessment & Plan Note (Signed)
Discussed difference between this and vasa previa with patient--follow up with MFM scheduled. Will need both BPP and growths per antenatal guidelines. Referred to [facility].

## 2021-03-05 ENCOUNTER — Encounter: Payer: Self-pay | Admitting: Family Medicine

## 2021-03-07 ENCOUNTER — Ambulatory Visit (INDEPENDENT_AMBULATORY_CARE_PROVIDER_SITE_OTHER): Payer: Medicaid Other | Admitting: Obstetrics and Gynecology

## 2021-03-07 ENCOUNTER — Other Ambulatory Visit: Payer: Self-pay

## 2021-03-07 VITALS — BP 118/74 | HR 92 | Wt 216.5 lb

## 2021-03-07 DIAGNOSIS — Z3480 Encounter for supervision of other normal pregnancy, unspecified trimester: Secondary | ICD-10-CM

## 2021-03-07 DIAGNOSIS — O283 Abnormal ultrasonic finding on antenatal screening of mother: Secondary | ICD-10-CM

## 2021-03-07 DIAGNOSIS — R309 Painful micturition, unspecified: Secondary | ICD-10-CM

## 2021-03-07 DIAGNOSIS — Z3A32 32 weeks gestation of pregnancy: Secondary | ICD-10-CM | POA: Diagnosis not present

## 2021-03-07 DIAGNOSIS — O2343 Unspecified infection of urinary tract in pregnancy, third trimester: Secondary | ICD-10-CM | POA: Insufficient documentation

## 2021-03-07 DIAGNOSIS — R8271 Bacteriuria: Secondary | ICD-10-CM

## 2021-03-07 DIAGNOSIS — O2340 Unspecified infection of urinary tract in pregnancy, unspecified trimester: Secondary | ICD-10-CM | POA: Insufficient documentation

## 2021-03-07 DIAGNOSIS — O43123 Velamentous insertion of umbilical cord, third trimester: Secondary | ICD-10-CM

## 2021-03-07 DIAGNOSIS — N200 Calculus of kidney: Secondary | ICD-10-CM

## 2021-03-07 LAB — POCT URINALYSIS DIPSTICK OB
Bilirubin, UA: NEGATIVE
Glucose, UA: NEGATIVE
Nitrite, UA: NEGATIVE
Spec Grav, UA: 1.01 (ref 1.010–1.025)
Urobilinogen, UA: 0.2 E.U./dL
pH, UA: 7 (ref 5.0–8.0)

## 2021-03-07 MED ORDER — CEPHALEXIN 500 MG PO CAPS
500.0000 mg | ORAL_CAPSULE | Freq: Four times a day (QID) | ORAL | 0 refills | Status: AC
Start: 2021-03-07 — End: 2021-03-12

## 2021-03-07 NOTE — Progress Notes (Signed)
PRENATAL VISIT NOTE  Subjective:  Alyssa Howell is a 20 y.o. G1P0 at [redacted]w[redacted]d being seen today for ongoing prenatal care.  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  She will receive further care and likely delivery at [facility]  Patient doing well with no acute concerns today. She reports  back and lower right leg pain along with dysuria .  Contractions: Not present. Vag. Bleeding: None.  Movement: Present. Denies leaking of fluid.   The following portions of the patient's history were reviewed and updated as appropriate: allergies, current medications, past family history, past medical history, past social history, past surgical history and problem list. Problem list updated.  Objective:   Vitals:   03/07/21 1434  BP: 118/74  Pulse: 92  Weight: 216 lb 8 oz (98.2 kg)    Fetal Status: Fetal Heart Rate (bpm): 154   Movement: Present     General:  Alert, oriented and cooperative. Patient is in no acute distress.  Skin: Skin is warm and dry. No rash noted.   Cardiovascular: Normal heart rate noted  Respiratory: Normal respiratory effort, no problems with respiration noted  Abdomen: Soft, gravid, appropriate for gestational age.  Pain/Pressure: Absent     Pelvic: Cervical exam deferred        Extremities: Normal range of motion.  Edema: None  Mental Status:  Normal mood and affect. Normal behavior. Normal judgment and thought content.   Assessment and Plan:  Pregnancy: G1P0  at [redacted]w[redacted]d  1. Supervision of other normal pregnancy, antepartum Continue routine care  - Culture, OB Urine - POC Urinalysis Dipstick OB  2. Painful urination  - Culture, OB Urine - POC Urinalysis Dipstick OB  3. Velamentous insertion of umbilical cord in third trimester Growth scan on 03/08/21  4. 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 due to needed cardiac specialists  5. GBS bacteriuria Treat in labor  6. [redacted] weeks gestation of pregnancy   7. UTI (urinary tract infection) during pregnancy, third trimester Will empirically treat, urine culture pending, could also be issue with recurrent kidney stones. - cephALEXin (KEFLEX) 500 MG capsule; Take 1 capsule (500 mg total) by mouth 4 (four) times daily for 5 days.  Dispense: 20 capsule; Refill: 0  8. Nephrolithiasis Pt advised to inform urology team of current symptoms in case a new treatment is needed.  - cephALEXin (KEFLEX) 500 MG capsule; Take 1 capsule (500 mg total) by mouth 4 (four) times daily for 5 days.  Dispense: 20 capsule; Refill: 0  Preterm labor symptoms and general obstetric precautions including but not limited to vaginal bleeding, contractions, leaking of fluid and fetal movement were reviewed in detail with the patient.  Please refer to After Visit Summary for other counseling recommendations.   Return in about 2 weeks (around 03/21/2021) for [facility], in person.  Appointment is pending, pt to transfer her care to [facility] due to need for  delivery there.   Mariel Aloe, MD Faculty Attending Center for [facility]

## 2021-03-08 ENCOUNTER — Encounter: Payer: Self-pay | Admitting: *Deleted

## 2021-03-08 ENCOUNTER — Ambulatory Visit: Payer: Medicaid Other | Admitting: *Deleted

## 2021-03-08 ENCOUNTER — Ambulatory Visit: Payer: Medicaid Other | Attending: Obstetrics and Gynecology

## 2021-03-08 VITALS — BP 111/70 | HR 110

## 2021-03-08 DIAGNOSIS — O99213 Obesity complicating pregnancy, third trimester: Secondary | ICD-10-CM | POA: Diagnosis not present

## 2021-03-08 DIAGNOSIS — O358XX0 Maternal care for other (suspected) fetal abnormality and damage, not applicable or unspecified: Secondary | ICD-10-CM | POA: Diagnosis not present

## 2021-03-08 DIAGNOSIS — E669 Obesity, unspecified: Secondary | ICD-10-CM | POA: Diagnosis not present

## 2021-03-08 DIAGNOSIS — O43123 Velamentous insertion of umbilical cord, third trimester: Secondary | ICD-10-CM | POA: Diagnosis not present

## 2021-03-08 DIAGNOSIS — Z3A32 32 weeks gestation of pregnancy: Secondary | ICD-10-CM

## 2021-03-08 DIAGNOSIS — Z6830 Body mass index (BMI) 30.0-30.9, adult: Secondary | ICD-10-CM | POA: Diagnosis present

## 2021-03-09 ENCOUNTER — Other Ambulatory Visit: Payer: Self-pay | Admitting: *Deleted

## 2021-03-09 DIAGNOSIS — O43123 Velamentous insertion of umbilical cord, third trimester: Secondary | ICD-10-CM

## 2021-03-09 LAB — CULTURE, OB URINE

## 2021-03-09 LAB — URINE CULTURE, OB REFLEX

## 2021-03-11 ENCOUNTER — Ambulatory Visit (INDEPENDENT_AMBULATORY_CARE_PROVIDER_SITE_OTHER): Payer: Medicaid Other | Admitting: Family Medicine

## 2021-03-11 ENCOUNTER — Encounter: Payer: Self-pay | Admitting: Family Medicine

## 2021-03-11 ENCOUNTER — Other Ambulatory Visit: Payer: Self-pay

## 2021-03-11 VITALS — BP 108/60 | HR 105 | Ht 66.0 in | Wt 214.5 lb

## 2021-03-11 DIAGNOSIS — Z23 Encounter for immunization: Secondary | ICD-10-CM

## 2021-03-11 DIAGNOSIS — Z0000 Encounter for general adult medical examination without abnormal findings: Secondary | ICD-10-CM | POA: Diagnosis not present

## 2021-03-11 NOTE — Patient Instructions (Addendum)
It was wonderful to see you today.  Please bring ALL of your medications with you to every visit.   Today we talked about:  - Start iron every other day   - You can follow up after your pregnancy - Follow up after your mom's wedding for your COVID booster     Thank you for choosing [facility].   Please call [phone] with any questions about today's appointment.  Please be sure to schedule follow up at the front  desk before you leave today.   Terisa Starr, MD  Family Medicine

## 2021-03-11 NOTE — Progress Notes (Signed)
    SUBJECTIVE:   CHIEF COMPLAINT: right leg pain,  HPI:   Alyssa Howell is a 20 y.o. G1P0 at [redacted]w[redacted]d with pregnancy complicaned by velamentous cord insertion and double aortic arch presenting for right leg pain and cramps in legs  She reports reflux is better. She is taking Tums or famotidine a few times a week. She admits to forgetting prenatal vitals. She often has right sided back pain and cramps in both legs at night. Feels like she constantly needs to move legs. No edema, redness, chest pain, dyspnea, numbness, weakness, falls.   Endorses good movement. No LOF or bleeding. No contractions.   PERTINENT  PMH / PSH/Family/Social History :Living with mother, who is getting married tomorrow Has boyfriend FOB is incarcerated  She is on FMLA leave   OBJECTIVE:   BP 108/60   Pulse (!) 116   Ht 5\' 6"  (1.676 m)   Wt 214 lb 8 oz (97.3 kg)   LMP 07/23/2020 (Exact Date)   SpO2 99%   BMI 34.62 kg/m   Today's weight:  Last Weight  Most recent update: 03/11/2021 10:08 AM    Weight  97.3 kg (214 lb 8 oz)            Review of prior weights: 03/13/2021   03/11/21 1007  Weight: 214 lb 8 oz (97.3 kg)   Gravid uterus  FHT 147 bpm  FH 32 cm   MSK Normal gait + right sided SI pain, no lower extremity edema, normal strength and sensation   ASSESSMENT/PLAN:   Restless legs, ferritin of 17, recommended every other day iron. Consider repeat with MFM. She will follow with MFM for remainder of pregnancy  Social Stressors, patient has supportive family and plans for postpartum period. Already on FMLA, resources offered. She will reach out if additional support services needed. Suspect infant will need to follow with Pediatrician (such as [facility])  HCM COVID next week--she would like to wait until after mother's wedding to obtain booster, discusssed      03/13/21, MD  Family Medicine Teaching Service  [facility]

## 2021-03-14 ENCOUNTER — Ambulatory Visit (INDEPENDENT_AMBULATORY_CARE_PROVIDER_SITE_OTHER): Payer: Medicaid Other

## 2021-03-14 ENCOUNTER — Other Ambulatory Visit: Payer: Self-pay

## 2021-03-14 DIAGNOSIS — Z23 Encounter for immunization: Secondary | ICD-10-CM

## 2021-03-21 ENCOUNTER — Ambulatory Visit (INDEPENDENT_AMBULATORY_CARE_PROVIDER_SITE_OTHER): Payer: Medicaid Other | Admitting: Obstetrics and Gynecology

## 2021-03-21 ENCOUNTER — Encounter: Payer: Self-pay | Admitting: Obstetrics and Gynecology

## 2021-03-21 ENCOUNTER — Other Ambulatory Visit: Payer: Self-pay

## 2021-03-21 VITALS — BP 106/75 | HR 114 | Wt 218.0 lb

## 2021-03-21 DIAGNOSIS — R8271 Bacteriuria: Secondary | ICD-10-CM

## 2021-03-21 DIAGNOSIS — Z3480 Encounter for supervision of other normal pregnancy, unspecified trimester: Secondary | ICD-10-CM

## 2021-03-21 DIAGNOSIS — O43123 Velamentous insertion of umbilical cord, third trimester: Secondary | ICD-10-CM

## 2021-03-21 DIAGNOSIS — O283 Abnormal ultrasonic finding on antenatal screening of mother: Secondary | ICD-10-CM

## 2021-03-21 NOTE — Progress Notes (Signed)
   PRENATAL VISIT NOTE  Subjective:  Alyssa Howell is a 20 y.o. G1P0 at [redacted]w[redacted]d being seen today for ongoing prenatal care.  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  Patient reports no complaints.  Contractions: Not present. Vag. Bleeding: None.  Movement: Present. Denies leaking of fluid.   The following portions of the patient's history were reviewed and updated as appropriate: allergies, current medications, past family history, past medical history, past social history, past surgical history and problem list.   Objective:   Vitals:   03/21/21 1029  BP: 106/75  Pulse: (!) 114  Weight: 218 lb (98.9 kg)    Fetal Status: Fetal Heart Rate (bpm): 155 Fundal Height: 34 cm Movement: Present     General:  Alert, oriented and cooperative. Patient is in no acute distress.  Skin: Skin is warm and dry. No rash noted.   Cardiovascular: Normal heart rate noted  Respiratory: Normal respiratory effort, no problems with respiration noted  Abdomen: Soft, gravid, appropriate for gestational age.  Pain/Pressure: Present     Pelvic: Cervical exam deferred        Extremities: Normal range of motion.     Mental Status: Normal mood and affect. Normal behavior. Normal judgment and thought content.   Assessment and Plan:  Pregnancy: G1P0 at [redacted]w[redacted]d 1. Supervision of other normal pregnancy, antepartum Patient is doing well Plans birth control pills for contraception  2. Velamentous insertion of umbilical cord in third trimester Follow up growth ultrasound on 12/12  3. GBS  bacteriuria Prophylaxis in labor  4. 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 Please refer to After Visit Summary for other counseling recommendations.   Return in about 2 weeks (around 04/04/2021) for in person, ROB, High risk.  Future Appointments  Date Time Provider Department Center  04/04/2021  3:30 PM [facility] NURSE [facility]  04/04/2021  3:45 PM [facility] US6 [facility]  08/31/2021  1:30 PM Vanna Scotland, MD [facility] None    Catalina Antigua, MD

## 2021-03-21 NOTE — Progress Notes (Signed)
Pt has appt this Wed with [facility] about delivery and NICU care.  Pt is having right sided pain, mostly at night.

## 2021-03-22 ENCOUNTER — Encounter: Payer: Self-pay | Admitting: Family Medicine

## 2021-03-22 NOTE — Telephone Encounter (Signed)
I called patient to discuss. Patient reports leaking of "some type of fluid." Patient reports its clear and looks like "water." Patient reports she does not think it is urine. Patient reports decreased fetal movement today. Patient reports she has not felt the baby move since this morning. Patient reports a lot of pressure in her abdomen. Patient denies bleeding.   Patient advised to go to [facility] for evaluation. Patient advised to also call her OB on call as she is followed by HR. Patient agreed with plan.

## 2021-03-23 NOTE — Telephone Encounter (Signed)
Agree with [facility] evaluation.

## 2021-04-04 ENCOUNTER — Ambulatory Visit (INDEPENDENT_AMBULATORY_CARE_PROVIDER_SITE_OTHER): Payer: Medicaid Other | Admitting: Obstetrics and Gynecology

## 2021-04-04 ENCOUNTER — Other Ambulatory Visit: Payer: Self-pay

## 2021-04-04 ENCOUNTER — Encounter: Payer: Self-pay | Admitting: Obstetrics and Gynecology

## 2021-04-04 ENCOUNTER — Ambulatory Visit: Payer: Medicaid Other | Attending: Obstetrics and Gynecology

## 2021-04-04 ENCOUNTER — Ambulatory Visit: Payer: Medicaid Other | Admitting: *Deleted

## 2021-04-04 ENCOUNTER — Encounter: Payer: Self-pay | Admitting: *Deleted

## 2021-04-04 VITALS — BP 118/70 | HR 108

## 2021-04-04 VITALS — BP 113/77 | HR 109 | Wt 223.0 lb

## 2021-04-04 DIAGNOSIS — O99323 Drug use complicating pregnancy, third trimester: Secondary | ICD-10-CM

## 2021-04-04 DIAGNOSIS — Z3480 Encounter for supervision of other normal pregnancy, unspecified trimester: Secondary | ICD-10-CM

## 2021-04-04 DIAGNOSIS — O43123 Velamentous insertion of umbilical cord, third trimester: Secondary | ICD-10-CM

## 2021-04-04 DIAGNOSIS — O99213 Obesity complicating pregnancy, third trimester: Secondary | ICD-10-CM | POA: Diagnosis not present

## 2021-04-04 DIAGNOSIS — E669 Obesity, unspecified: Secondary | ICD-10-CM

## 2021-04-04 DIAGNOSIS — O283 Abnormal ultrasonic finding on antenatal screening of mother: Secondary | ICD-10-CM

## 2021-04-04 DIAGNOSIS — Q2545 Double aortic arch: Secondary | ICD-10-CM | POA: Insufficient documentation

## 2021-04-04 DIAGNOSIS — O35BXX0 Maternal care for other (suspected) fetal abnormality and damage, fetal cardiac anomalies, not applicable or unspecified: Secondary | ICD-10-CM | POA: Diagnosis not present

## 2021-04-04 DIAGNOSIS — Z3A36 36 weeks gestation of pregnancy: Secondary | ICD-10-CM

## 2021-04-04 DIAGNOSIS — R8271 Bacteriuria: Secondary | ICD-10-CM

## 2021-04-04 NOTE — Progress Notes (Signed)
ROB 36.3 wks GC/CC today? For TOC at [facility] on Friday. GBS + urine previously, swabbed again at [facility] (neg)  Reports ride side facial swelling 2 days ago, face was tender, denies dental or sinus pain, or new products.  IOL at [facility] 04/21/21

## 2021-04-04 NOTE — Progress Notes (Signed)
   PRENATAL VISIT NOTE  Subjective:  Alyssa Howell is a 20 y.o. G1P0 at [redacted]w[redacted]d being seen today for ongoing prenatal care.  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  Patient reports no complaints.  Contractions: Not present. Vag. Bleeding: Scant.  Movement: Present. Denies leaking of fluid.   The following portions of the patient's history were reviewed and updated as appropriate: allergies, current medications, past family history, past medical history, past social history, past surgical history and problem list.   Objective:   Vitals:   04/04/21 1329  BP: 113/77  Pulse: (!) 109  Weight: 223 lb (101.2 kg)    Fetal Status: Fetal Heart Rate (bpm): 160 (Simultaneous filing. User may not have seen previous data.) Fundal Height: 36 cm Movement: Present     General:  Alert, oriented and cooperative. Patient is in no acute distress.  Skin: Skin is warm and dry. No rash noted.   Cardiovascular: Normal heart rate noted  Respiratory: Normal respiratory effort, no problems with respiration noted  Abdomen: Soft, gravid, appropriate for gestational age.  Pain/Pressure: Present     Pelvic: Cervical exam deferred        Extremities: Normal range of motion.  Edema: None  Mental Status: Normal mood and affect. Normal behavior. Normal judgment and thought content.   Assessment and Plan:  Pregnancy: G1P0 at [redacted]w[redacted]d 1. Supervision of other normal pregnancy, antepartum Patient is doing well without complaints Had cultures collected by [facility]- Patient is scheduled to follow up with them  on Friday  2. Velamentous insertion of umbilical cord in third trimester Follow up ultrasound today  3. GBS bacteriuria Prophylaxis in labor  4. 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 Please refer to After Visit Summary for other counseling recommendations.   Return in about 1 week (around 04/11/2021) for in person, ROB, High risk.  Future Appointments  Date Time Provider Department Center  04/04/2021  3:30 PM [facility] NURSE [facility]  04/04/2021  3:45 PM [facility] US6 [facility]  08/31/2021  1:30 PM Vanna Scotland, MD [facility] None    Catalina Antigua, MD

## 2021-04-11 ENCOUNTER — Encounter: Payer: Medicaid Other | Admitting: Obstetrics and Gynecology

## 2021-04-15 ENCOUNTER — Encounter (HOSPITAL_COMMUNITY): Payer: Self-pay | Admitting: Obstetrics and Gynecology

## 2021-04-15 ENCOUNTER — Inpatient Hospital Stay (HOSPITAL_COMMUNITY)
Admission: AD | Admit: 2021-04-15 | Discharge: 2021-04-15 | Disposition: A | Discharge status: Home / Self Care | Payer: Medicaid Other | Attending: Obstetrics and Gynecology | Admitting: Obstetrics and Gynecology

## 2021-04-15 ENCOUNTER — Other Ambulatory Visit: Payer: Self-pay

## 2021-04-15 DIAGNOSIS — O26893 Other specified pregnancy related conditions, third trimester: Secondary | ICD-10-CM | POA: Insufficient documentation

## 2021-04-15 DIAGNOSIS — O2343 Unspecified infection of urinary tract in pregnancy, third trimester: Secondary | ICD-10-CM | POA: Diagnosis not present

## 2021-04-15 DIAGNOSIS — O26833 Pregnancy related renal disease, third trimester: Secondary | ICD-10-CM | POA: Diagnosis not present

## 2021-04-15 DIAGNOSIS — Z3A38 38 weeks gestation of pregnancy: Secondary | ICD-10-CM | POA: Diagnosis not present

## 2021-04-15 DIAGNOSIS — N200 Calculus of kidney: Secondary | ICD-10-CM | POA: Diagnosis not present

## 2021-04-15 DIAGNOSIS — R109 Unspecified abdominal pain: Secondary | ICD-10-CM | POA: Insufficient documentation

## 2021-04-15 HISTORY — DX: Vesicoureteral-reflux, unspecified: N13.70

## 2021-04-15 HISTORY — DX: Calculus of kidney: N20.0

## 2021-04-15 LAB — URINALYSIS, ROUTINE W REFLEX MICROSCOPIC
Bilirubin Urine: NEGATIVE
Glucose, UA: NEGATIVE mg/dL
Ketones, ur: NEGATIVE mg/dL
Nitrite: POSITIVE — AB
Protein, ur: NEGATIVE mg/dL
Specific Gravity, Urine: 1.025 (ref 1.005–1.030)
pH: 6 (ref 5.0–8.0)

## 2021-04-15 LAB — URINALYSIS, MICROSCOPIC (REFLEX): Squamous Epithelial / HPF: >50 (ref 0–5)

## 2021-04-15 LAB — CBC WITH DIFFERENTIAL/PLATELET
Abs Immature Granulocytes: 0.1 10*3/uL — ABNORMAL HIGH (ref 0.00–0.07)
Basophils Absolute: 0.1 10*3/uL (ref 0.0–0.1)
Basophils Relative: 0 %
Eosinophils Absolute: 0 10*3/uL (ref 0.0–0.5)
Eosinophils Relative: 0 %
HCT: 30.4 % — ABNORMAL LOW (ref 36.0–46.0)
Hemoglobin: 10.3 g/dL — ABNORMAL LOW (ref 12.0–15.0)
Immature Granulocytes: 1 %
Lymphocytes Relative: 11 %
Lymphs Abs: 1.4 10*3/uL (ref 0.7–4.0)
MCH: 33.3 pg (ref 26.0–34.0)
MCHC: 33.9 g/dL (ref 30.0–36.0)
MCV: 98.4 fL (ref 80.0–100.0)
Monocytes Absolute: 1.1 10*3/uL — ABNORMAL HIGH (ref 0.1–1.0)
Monocytes Relative: 9 %
Neutro Abs: 10 10*3/uL — ABNORMAL HIGH (ref 1.7–7.7)
Neutrophils Relative %: 79 %
Platelets: 221 10*3/uL (ref 150–400)
RBC: 3.09 MIL/uL — ABNORMAL LOW (ref 3.87–5.11)
RDW: 13.2 % (ref 11.5–15.5)
WBC: 12.7 10*3/uL — ABNORMAL HIGH (ref 4.0–10.5)
nRBC: 0 % (ref 0.0–0.2)

## 2021-04-15 MED ORDER — CEFADROXIL 500 MG PO CAPS: 500.0000 mg | ORAL_CAPSULE | Freq: Two times a day (BID) | ORAL | 0 refills | Status: AC

## 2021-04-15 MED ORDER — ONDANSETRON HCL 4 MG PO TABS: 4.0000 mg | ORAL_TABLET | Freq: Three times a day (TID) | ORAL | 0 refills | Status: DC | PRN

## 2021-04-15 MED ORDER — MORPHINE SULFATE (PF) 4 MG/ML IV SOLN
4.0000 mg | Freq: Once | INTRAVENOUS | Status: AC
  Administered 2021-04-15: 15:00:00 4 mg via INTRAVENOUS
  Filled 2021-04-15: qty 1

## 2021-04-15 MED ORDER — TAMSULOSIN HCL 0.4 MG PO CAPS: 0.4000 mg | ORAL_CAPSULE | Freq: Every day | ORAL | 0 refills | Status: DC

## 2021-04-15 MED ORDER — SODIUM CHLORIDE 0.9 % IV BOLUS
1000.0000 mL | Freq: Once | INTRAVENOUS | Status: AC
  Administered 2021-04-15: 15:00:00 1000 mL via INTRAVENOUS

## 2021-04-15 MED ORDER — PHENAZOPYRIDINE HCL 200 MG PO TABS: 200.0000 mg | ORAL_TABLET | Freq: Three times a day (TID) | ORAL | 0 refills | Status: DC | PRN

## 2021-04-15 MED ORDER — SODIUM CHLORIDE 0.9 % IV SOLN
2.0000 g | Freq: Once | INTRAVENOUS | Status: AC
  Administered 2021-04-15: 16:00:00 2 g via INTRAVENOUS
  Filled 2021-04-15: qty 20

## 2021-04-15 MED ORDER — OXYCODONE-ACETAMINOPHEN 5-325 MG PO TABS
1.0000 | ORAL_TABLET | Freq: Four times a day (QID) | ORAL | 0 refills | Status: DC | PRN
Start: 2021-04-15 — End: 2021-12-28

## 2021-04-15 NOTE — MAU Note (Signed)
Presents with c/o dysuria, hematuria, and back pain.  States called provider @ [facility] where she gets [facility], instructed to go to ER secondary urine culture hasn't resulted.  Reports had a fever, took Tylenol prior to checking temp.  Temp 99 when checked.  Endorses +FM.  Denies VB or LOF.

## 2021-04-15 NOTE — MAU Provider Note (Signed)
Chief Complaint:  Dysuria and Back Pain   Event Date/Time   First Provider Initiated Contact with Patient 04/15/21 1444      HPI: Alyssa Howell is a 20 y.o. G1P0 at [redacted]w[redacted]d by LMP with known renal calculi and fetal cardiac abnormality with delivery planned at [facility] who presents to maternity admissions reporting onset of dysuria and sharp lower back pain after urinating.  She reports feeling feverish with chills but took Tylenol before taking her temperature which was 99.9 at home. She denies n/v or other symptoms. She reports her kidney stones have not caused pain in a month or two and this pain started suddenly in the last few days. She had urinalysis at her OB visit at [facility] showing positive nitrites but the culture is still pending and she was not prescribed any antibiotics.   She reports good fetal movement, denies regular contractions, LOF, or vaginal bleeding.  IOL is planned on 04/21/21 at [facility].     Location: low back, left flank Quality: sharp Severity: 8/10 on pain scale Duration: 3-4 days Timing: intermittent Modifying factors: none Associated signs and symptoms: dysuria, chills at home  HPI  Past Medical History: Past Medical History:  Diagnosis Date   ADHD    Depression    Kidney stones    Urinary reflux    Vesico-ureteral reflux     Past obstetric history: OB History  Gravida Para Term Preterm AB Living  1            SAB IAB Ectopic Multiple Live Births               # Outcome Date GA Lbr Len/2nd Weight Sex Delivery Anes PTL Lv  1 Current             Past Surgical History: Past Surgical History:  Procedure Laterality Date   CYSTOSCOPY/URETEROSCOPY/HOLMIUM LASER/STENT PLACEMENT Right 08/02/2020   Procedure: CYSTOSCOPY/URETEROSCOPY/HOLMIUM LASER/STENT PLACEMENT;  Surgeon: Hollice Espy, MD;  Location: [facility] ORS;  Service: Urology;  Laterality: Right;   kidney stones     TONSILLECTOMY      Family History: Family History  Problem Relation Age of Onset    Hepatitis C Mother    Valvular heart disease Mother    Healthy Father    Breast cancer Maternal Grandmother    Diabetes Other    Heart attack Other     Social History: Social History   Tobacco Use   Smoking status: Never   Smokeless tobacco: Never  Vaping Use   Vaping Use: Never used  Substance Use Topics   Alcohol use: Not Currently   Drug use: Not Currently    Types: Marijuana    Comment: last used the end of September 2022 as of 02/07/2021    Allergies:  Allergies  Allergen Reactions   Abilify [Aripiprazole] Other (See Comments)    Seizures    Meds:  Medications Prior to Admission  Medication Sig Dispense Refill Last Dose   famotidine (PEPCID) 40 MG tablet Take 1 tablet (40 mg total) by mouth 2 (two) times daily. 60 tablet 0    Prenatal Vit-Fe Fumarate-FA (PRENATAL MULTIVITAMIN) TABS tablet Take 1 tablet by mouth daily at 12 noon. (Patient taking differently: Take 1 tablet by mouth daily at 12 noon. Taking OTC gummies) 90 tablet 2    [DISCONTINUED] tamsulosin (FLOMAX) 0.4 MG CAPS capsule Take 1 capsule (0.4 mg total) by mouth daily. (Patient not taking: Reported on 03/07/2021) 30 capsule 0     ROS:  Review of  Systems  Constitutional:  Negative for chills, fatigue and fever.  Eyes:  Negative for visual disturbance.  Respiratory:  Negative for shortness of breath.   Cardiovascular:  Negative for chest pain.  Gastrointestinal:  Negative for abdominal pain, nausea and vomiting.  Genitourinary:  Positive for dysuria and flank pain. Negative for difficulty urinating, pelvic pain, vaginal bleeding, vaginal discharge and vaginal pain.  Musculoskeletal:  Positive for back pain.  Neurological:  Negative for dizziness and headaches.  Psychiatric/Behavioral: Negative.      I have reviewed patient's Past Medical Hx, Surgical Hx, Family Hx, Social Hx, medications and allergies.   Physical Exam  Patient Vitals for the past 24 hrs:  BP Temp Temp src Pulse Resp SpO2 Height  Weight  04/15/21 1658 129/81 -- -- (!) 113 -- -- -- --  04/15/21 1401 113/78 98.1 F (36.7 C) Oral (!) 124 19 100 % -- --  04/15/21 1352 -- -- -- -- -- -- 5\' 7"  (1.702 m) 102.4 kg   Constitutional: Well-developed, well-nourished female in no acute distress.  Cardiovascular: normal rate Respiratory: normal effort GI: Abd soft, non-tender, gravid appropriate for gestational age.  MS: Extremities nontender, no edema, normal ROM Neurologic: Alert and oriented x 4.  GU: Neg CVAT.  PELVIC EXAM: deferred     FHT:  Baseline 150 , moderate variability, accelerations present, no decelerations Contractions: none on toco or to palpation   Labs: Results for orders placed or performed during the hospital encounter of 04/15/21 (from the past 24 hour(s))  Urinalysis, Routine w reflex microscopic Urine, Clean Catch     Status: Abnormal   Collection Time: 04/15/21  2:06 PM  Result Value Ref Range   Color, Urine YELLOW YELLOW   APPearance CLOUDY (A) CLEAR   Specific Gravity, Urine 1.025 1.005 - 1.030   pH 6.0 5.0 - 8.0   Glucose, UA NEGATIVE NEGATIVE mg/dL   Hgb urine dipstick TRACE (A) NEGATIVE   Bilirubin Urine NEGATIVE NEGATIVE   Ketones, ur NEGATIVE NEGATIVE mg/dL   Protein, ur NEGATIVE NEGATIVE mg/dL   Nitrite POSITIVE (A) NEGATIVE   Leukocytes,Ua SMALL (A) NEGATIVE  Urinalysis, Microscopic (reflex)     Status: Abnormal   Collection Time: 04/15/21  2:06 PM  Result Value Ref Range   RBC / HPF 11-20 0 - 5 RBC/hpf   WBC, UA 21-50 0 - 5 WBC/hpf   Bacteria, UA MANY (A) NONE SEEN   Squamous Epithelial / LPF >50 0 - 5   Mucus PRESENT   CBC with Differential     Status: Abnormal   Collection Time: 04/15/21  3:22 PM  Result Value Ref Range   WBC 12.7 (H) 4.0 - 10.5 K/uL   RBC 3.09 (L) 3.87 - 5.11 MIL/uL   Hemoglobin 10.3 (L) 12.0 - 15.0 g/dL   HCT 30.4 (L) 36.0 - 46.0 %   MCV 98.4 80.0 - 100.0 fL   MCH 33.3 26.0 - 34.0 pg   MCHC 33.9 30.0 - 36.0 g/dL   RDW 13.2 11.5 - 15.5 %    Platelets 221 150 - 400 K/uL   nRBC 0.0 0.0 - 0.2 %   Neutrophils Relative % 79 %   Neutro Abs 10.0 (H) 1.7 - 7.7 K/uL   Lymphocytes Relative 11 %   Lymphs Abs 1.4 0.7 - 4.0 K/uL   Monocytes Relative 9 %   Monocytes Absolute 1.1 (H) 0.1 - 1.0 K/uL   Eosinophils Relative 0 %   Eosinophils Absolute 0.0 0.0 - 0.5 K/uL  Basophils Relative 0 %   Basophils Absolute 0.1 0.0 - 0.1 K/uL   Immature Granulocytes 1 %   Abs Immature Granulocytes 0.10 (H) 0.00 - 0.07 K/uL   A/Positive/-- (05/23 [id number])  Imaging:   MAU Course/MDM: Orders Placed This Encounter  Procedures   Culture, OB Urine   Urinalysis, Routine w reflex microscopic Urine, Clean Catch   Urinalysis, Microscopic (reflex)   CBC with Differential   Discharge patient    Meds ordered this encounter  Medications   sodium chloride 0.9 % bolus 1,000 mL   cefTRIAXone (ROCEPHIN) 2 g in sodium chloride 0.9 % 100 mL IVPB    Order Specific Question:   Antibiotic Indication:    Answer:   UTI   morphine 4 MG/ML injection 4 mg   phenazopyridine (PYRIDIUM) 200 MG tablet    Sig: Take 1 tablet (200 mg total) by mouth 3 (three) times daily as needed for pain.    Dispense:  10 tablet    Refill:  0    Order Specific Question:   Supervising Provider    Answer:   Donnamae Jude [id number]   oxyCODONE-acetaminophen (PERCOCET/ROXICET) 5-325 MG tablet    Sig: Take 1-2 tablets by mouth every 6 (six) hours as needed for severe pain.    Dispense:  10 tablet    Refill:  0    Order Specific Question:   Supervising Provider    Answer:   Donnamae Jude [[id number]]   tamsulosin (FLOMAX) 0.4 MG CAPS capsule    Sig: Take 1 capsule (0.4 mg total) by mouth daily.    Dispense:  30 capsule    Refill:  0    Order Specific Question:   Supervising Provider    Answer:   Donnamae Jude [[id number]]   cefadroxil (DURICEF) 500 MG capsule    Sig: Take 1 capsule (500 mg total) by mouth 2 (two) times daily for 7 days.    Dispense:  14 capsule    Refill:  0    Order Specific  Question:   Supervising Provider    Answer:   Donnamae Jude [[id number]]   ondansetron (ZOFRAN) 4 MG tablet    Sig: Take 1 tablet (4 mg total) by mouth every 8 (eight) hours as needed for nausea or vomiting.    Dispense:  20 tablet    Refill:  0    Order Specific Question:   Supervising Provider    Answer:   Donnamae Jude [id number]     NST reviewed and reactive UA with positive nitrites, pt with s/s of UTI No evidence of pyelo with no CVA tenderness, no n/v, normal WBCs, no documented fever With pt subjective report of fever and risks for UTI/pyelo with known calculi, will treat with IV Rocephin in MAU D/C home with Duricef x 7 days, pyridium and Percocet for pain Zofran at pt request as pain medication causes nausea Renew Flomax for pt to take daily F/U with [facility] as scheduled, return to nearest ED/MAU for emergencies, or signs of labor  Pt discharge with strict return precautions.    Assessment: 1. UTI (urinary tract infection) during pregnancy, third trimester   2. Nephrolithiasis   3. [redacted] weeks gestation of pregnancy     Plan: Discharge home Labor precautions and fetal kick counts  Follow-up Information     Your prenatal provider at [facility] Follow up.   Why: As scheduled  Allergies as of 04/15/2021       Reactions   Abilify [aripiprazole] Other (See Comments)   Seizures        Medication List     TAKE these medications    cefadroxil 500 MG capsule Commonly known as: DURICEF Take 1 capsule (500 mg total) by mouth 2 (two) times daily for 7 days.   famotidine 40 MG tablet Commonly known as: Pepcid Take 1 tablet (40 mg total) by mouth 2 (two) times daily.   ondansetron 4 MG tablet Commonly known as: Zofran Take 1 tablet (4 mg total) by mouth every 8 (eight) hours as needed for nausea or vomiting.   oxyCODONE-acetaminophen 5-325 MG tablet Commonly known as: PERCOCET/ROXICET Take 1-2 tablets by mouth every 6 (six) hours as needed for severe  pain.   phenazopyridine 200 MG tablet Commonly known as: Pyridium Take 1 tablet (200 mg total) by mouth 3 (three) times daily as needed for pain.   prenatal multivitamin Tabs tablet Take 1 tablet by mouth daily at 12 noon. What changed: additional instructions   tamsulosin 0.4 MG Caps capsule Commonly known as: FLOMAX Take 1 capsule (0.4 mg total) by mouth daily.        Sharen Counter Certified Nurse-Midwife 04/15/2021 5:38 PM

## 2021-04-17 LAB — CULTURE, OB URINE

## 2021-04-24 DIAGNOSIS — O35BXX1 Maternal care for other (suspected) fetal abnormality and damage, fetal cardiac anomalies, fetus 1: Secondary | ICD-10-CM | POA: Diagnosis not present

## 2021-04-26 DIAGNOSIS — Z7151 Drug abuse counseling and surveillance of drug abuser: Secondary | ICD-10-CM | POA: Diagnosis not present

## 2021-04-26 DIAGNOSIS — F1120 Opioid dependence, uncomplicated: Secondary | ICD-10-CM | POA: Diagnosis not present

## 2021-04-27 ENCOUNTER — Inpatient Hospital Stay (HOSPITAL_COMMUNITY): Admit: 2021-04-27 | Payer: Self-pay

## 2021-04-28 ENCOUNTER — Emergency Department: Payer: Medicaid Other

## 2021-04-28 ENCOUNTER — Other Ambulatory Visit: Payer: Self-pay

## 2021-04-28 ENCOUNTER — Emergency Department
Admission: EM | Admit: 2021-04-28 | Discharge: 2021-04-28 | Disposition: A | Discharge status: Home / Self Care | Payer: Medicaid Other | Attending: Emergency Medicine | Admitting: Emergency Medicine

## 2021-04-28 DIAGNOSIS — R079 Chest pain, unspecified: Secondary | ICD-10-CM | POA: Diagnosis not present

## 2021-04-28 DIAGNOSIS — B9689 Other specified bacterial agents as the cause of diseases classified elsewhere: Secondary | ICD-10-CM | POA: Insufficient documentation

## 2021-04-28 DIAGNOSIS — N719 Inflammatory disease of uterus, unspecified: Secondary | ICD-10-CM | POA: Diagnosis not present

## 2021-04-28 DIAGNOSIS — Z20822 Contact with and (suspected) exposure to covid-19: Secondary | ICD-10-CM | POA: Insufficient documentation

## 2021-04-28 DIAGNOSIS — N852 Hypertrophy of uterus: Secondary | ICD-10-CM | POA: Diagnosis not present

## 2021-04-28 DIAGNOSIS — O8612 Endometritis following delivery: Secondary | ICD-10-CM | POA: Diagnosis not present

## 2021-04-28 DIAGNOSIS — N939 Abnormal uterine and vaginal bleeding, unspecified: Secondary | ICD-10-CM | POA: Diagnosis not present

## 2021-04-28 DIAGNOSIS — R109 Unspecified abdominal pain: Secondary | ICD-10-CM | POA: Diagnosis not present

## 2021-04-28 DIAGNOSIS — R000 Tachycardia, unspecified: Secondary | ICD-10-CM | POA: Diagnosis not present

## 2021-04-28 DIAGNOSIS — R9389 Abnormal findings on diagnostic imaging of other specified body structures: Secondary | ICD-10-CM | POA: Diagnosis not present

## 2021-04-28 LAB — CBC
HCT: 29.4 % — ABNORMAL LOW (ref 36.0–46.0)
Hemoglobin: 9.3 g/dL — ABNORMAL LOW (ref 12.0–15.0)
MCH: 31.2 pg (ref 26.0–34.0)
MCHC: 31.6 g/dL (ref 30.0–36.0)
MCV: 98.7 fL (ref 80.0–100.0)
Platelets: 369 10*3/uL (ref 150–400)
RBC: 2.98 MIL/uL — ABNORMAL LOW (ref 3.87–5.11)
RDW: 14.1 % (ref 11.5–15.5)
WBC: 16.4 10*3/uL — ABNORMAL HIGH (ref 4.0–10.5)
nRBC: 0 % (ref 0.0–0.2)

## 2021-04-28 LAB — COMPREHENSIVE METABOLIC PANEL
ALT: 14 U/L (ref 0–44)
AST: 17 U/L (ref 15–41)
Albumin: 2.6 g/dL — ABNORMAL LOW (ref 3.5–5.0)
Alkaline Phosphatase: 114 U/L (ref 38–126)
Anion gap: 9 (ref 5–15)
BUN: 13 mg/dL (ref 6–20)
CO2: 26 mmol/L (ref 22–32)
Calcium: 8.4 mg/dL — ABNORMAL LOW (ref 8.9–10.3)
Chloride: 107 mmol/L (ref 98–111)
Creatinine, Ser: 0.68 mg/dL (ref 0.44–1.00)
GFR, Estimated: >60 mL/min (ref >60)
Glucose, Bld: 82 mg/dL (ref 70–99)
Potassium: 4.1 mmol/L (ref 3.5–5.1)
Sodium: 142 mmol/L (ref 135–145)
Total Bilirubin: 0.5 mg/dL (ref 0.3–1.2)
Total Protein: 6.4 g/dL — ABNORMAL LOW (ref 6.5–8.1)

## 2021-04-28 LAB — BASIC METABOLIC PANEL
Anion gap: 8 (ref 5–15)
BUN: 11 mg/dL (ref 6–20)
CO2: 23 mmol/L (ref 22–32)
Calcium: 8.5 mg/dL — ABNORMAL LOW (ref 8.9–10.3)
Chloride: 107 mmol/L (ref 98–111)
Creatinine, Ser: 0.72 mg/dL (ref 0.44–1.00)
GFR, Estimated: >60 mL/min (ref >60)
Glucose, Bld: 113 mg/dL — ABNORMAL HIGH (ref 70–99)
Potassium: 3.7 mmol/L (ref 3.5–5.1)
Sodium: 138 mmol/L (ref 135–145)

## 2021-04-28 LAB — TROPONIN I (HIGH SENSITIVITY)
Troponin I (High Sensitivity): 5 ng/L (ref <18)
Troponin I (High Sensitivity): 6 ng/L (ref <18)

## 2021-04-28 LAB — D-DIMER, QUANTITATIVE: D-Dimer, Quant: 1.15 ug/mL-FEU — ABNORMAL HIGH (ref 0.00–0.50)

## 2021-04-28 LAB — RESP PANEL BY RT-PCR (FLU A&B, COVID) ARPGX2
Influenza A by PCR: NEGATIVE
Influenza B by PCR: NEGATIVE
SARS Coronavirus 2 by RT PCR: NEGATIVE

## 2021-04-28 LAB — HCG, QUANTITATIVE, PREGNANCY: hCG, Beta Chain, Quant, S: 388 m[IU]/mL — ABNORMAL HIGH (ref <5)

## 2021-04-28 LAB — LACTIC ACID, PLASMA: Lactic Acid, Venous: 0.8 mmol/L (ref 0.5–1.9)

## 2021-04-28 MED ORDER — AMOXICILLIN-POT CLAVULANATE 875-125 MG PO TABS
1.0000 | ORAL_TABLET | Freq: Once | ORAL | Status: AC
  Administered 2021-04-28: 1 via ORAL
  Filled 2021-04-28: qty 1

## 2021-04-28 MED ORDER — SODIUM CHLORIDE 0.9 % IV BOLUS
1000.0000 mL | Freq: Once | INTRAVENOUS | Status: AC
  Administered 2021-04-28: 1000 mL via INTRAVENOUS

## 2021-04-28 MED ORDER — FLUCONAZOLE 150 MG PO TABS: 150.0000 mg | ORAL_TABLET | Freq: Every day | ORAL | 0 refills | Status: AC

## 2021-04-28 MED ORDER — KETOROLAC TROMETHAMINE 30 MG/ML IJ SOLN
15.0000 mg | Freq: Once | INTRAMUSCULAR | Status: AC
  Administered 2021-04-28: 15 mg via INTRAVENOUS
  Filled 2021-04-28: qty 1

## 2021-04-28 MED ORDER — OXYCODONE-ACETAMINOPHEN 5-325 MG PO TABS
1.0000 | ORAL_TABLET | Freq: Once | ORAL | Status: AC
Start: 2021-04-28 — End: 2021-04-28
  Administered 2021-04-28: 1 via ORAL
  Filled 2021-04-28: qty 1

## 2021-04-28 MED ORDER — IOHEXOL 350 MG/ML SOLN
75.0000 mL | Freq: Once | INTRAVENOUS | Status: AC | PRN
Start: 2021-04-28 — End: 2021-04-28
  Administered 2021-04-28: 75 mL via INTRAVENOUS
  Filled 2021-04-28: qty 75

## 2021-04-28 MED ORDER — METOCLOPRAMIDE HCL 10 MG PO TABS
10.0000 mg | ORAL_TABLET | Freq: Once | ORAL | Status: AC
  Administered 2021-04-28: 10 mg via ORAL
  Filled 2021-04-28: qty 1

## 2021-04-28 MED ORDER — AMOXICILLIN-POT CLAVULANATE 875-125 MG PO TABS: 1.0000 | ORAL_TABLET | Freq: Two times a day (BID) | ORAL | 0 refills | Status: AC

## 2021-04-28 NOTE — ED Triage Notes (Signed)
Pt is 5 days post partum, pt c/o chest pain, abd cramping and passing quarter sized blood clots and a HA, pt states she had a failed epidural. Pt is in NAD, ambulatory to triage with a steady gait

## 2021-04-28 NOTE — Discharge Instructions (Signed)
You likely have an infection of your uterus which is related to giving birth.  Please take the antibiotic twice a day for the next 7 days.  If after several doses of antibiotics your pain is not improving or is worsening or you continue to have fevers, please return to the emergency department.

## 2021-04-28 NOTE — ED Notes (Signed)
Ultrasound at bedside

## 2021-04-28 NOTE — ED Provider Notes (Signed)
[facility] Provider Note    Event Date/Time   First MD Initiated Contact with Patient 04/28/21 1242     (approximate)   History   Vaginal Bleeding and Chest Pain   HPI  Alyssa Howell is a 21 y.o. female with past medical history of ADHD, depression who is postpartum day 5 after vaginal delivery presenting with abdominal cramping.  Patient had a uncomplicated vaginal delivery on 12/31.  She stayed in the hospital until 1/3 which she tells me was because her baby was in the NICU not because of her own complications.  While in the hospital she had lower abdominal cramping which she tells me she told the providers about but was ultimately discharged without intervention.  She has been home for 2 nights.  She endorses lower abdominal cramping as well as passing clots.  Says that when every time she stands up she passes a clot.  She is wearing a pad and changing it every time she goes to the bathroom but not because she is soaking through it.  She has also had a headache that is frontal in location without associated visual change numbness or weakness no nausea or vomiting.  She does have some burning with urination.  Denies foul-smelling vaginal discharge.  Patient also endorses chest pain between her breasts as well as shortness of breath.  Did have a fever last night of 101.   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 01/11/2021   Velamentous insertion of umbilical cord 01/11/2021   Nephrolithiasis 11/26/2020   Supervision of  other normal pregnancy, antepartum 09/13/2020   GERD (gastroesophageal reflux disease) 10/09/2019   Marijuana use 02/24/2019   Depression, recurrent (HCC) 02/24/2019     Physical Exam  Triage Vital Signs: ED Triage Vitals  Enc Vitals Group     BP 04/28/21 1047 130/79     Pulse Rate 04/28/21 1047 (!) 128     Resp 04/28/21 1047 19     Temp 04/28/21 1047 97.7 F (36.5 C)     Temp Source 04/28/21 1047 Oral     SpO2 04/28/21 1047 98 %     Weight 04/28/21 1048 213 lb (96.6 kg)     Height 04/28/21 1048 5\' 7"  (1.702 m)     Head Circumference --      Peak Flow --      Pain Score 04/28/21 1057 9     Pain Loc --      Pain Edu? --      Excl. in GC? --     Most recent vital signs: Vitals:   04/28/21 1404 04/28/21 1624  BP: 130/80 121/72  Pulse: (!) 108 (!) 102  Resp: 19 15  Temp:  98.2 F (36.8 C)  SpO2: 98% 100%     General: Awake, no distress.  CV:  Good peripheral perfusion.  Resp:  Normal effort.  Abd:  No distention.  Mild tenderness to palpation in the bilateral lower quadrants, primarily in the suprapubic region  Neuro:             Awake, Alert, Oriented x 3  Aox3, nml speech  PERRL, EOMI, face symmetric, nml tongue movement  5/5 strength in the BL upper and lower extremities  Sensation grossly intact in the BL upper and lower extremities  Finger-nose-finger intact BL  Other:     ED Results / Procedures / Treatments  Labs (all labs ordered are listed, but only abnormal results are displayed) Labs Reviewed  BASIC METABOLIC PANEL - Abnormal; Notable for the following components:      Result Value   Glucose, Bld 113 (*)    Calcium 8.5 (*)    All other components within normal limits  CBC - Abnormal; Notable for the following components:   WBC 16.4 (*)    RBC 2.98 (*)    Hemoglobin 9.3 (*)    HCT 29.4 (*)    All other components within normal limits  COMPREHENSIVE METABOLIC PANEL - Abnormal; Notable for the following components:   Calcium 8.4 (*)    Total  Protein 6.4 (*)    Albumin 2.6 (*)    All other components within normal limits  HCG, QUANTITATIVE, PREGNANCY - Abnormal; Notable for the following components:   hCG, Beta Chain, Quant, S 388 (*)    All other components within normal limits  D-DIMER, QUANTITATIVE - Abnormal; Notable for the following components:   D-Dimer, Quant 1.15 (*)    All other components within normal limits  RESP PANEL BY RT-PCR (FLU A&B, COVID) ARPGX2  LACTIC ACID, PLASMA  URINALYSIS, COMPLETE (UACMP) WITH MICROSCOPIC  LACTIC ACID, PLASMA  TROPONIN I (HIGH SENSITIVITY)  TROPONIN I (HIGH SENSITIVITY)     EKG  Sinus tachycardia, normal axis intervals no acute ischemic changes, EKG reviewed by myself   RADIOLOGY I reviewed the CXR which does not show any acute cardiopulmonary process; agree with radiology report    PROCEDURES:  Critical Care performed: No    MEDICATIONS ORDERED IN ED: Medications  oxyCODONE-acetaminophen (PERCOCET/ROXICET) 5-325 MG per tablet 1 tablet (has no administration in time range)  amoxicillin-clavulanate (AUGMENTIN) 875-125 MG per tablet 1 tablet (has no administration in time range)  ketorolac (TORADOL) 30 MG/ML injection 15 mg (15 mg Intravenous Given 04/28/21 1345)  metoCLOPramide (REGLAN) tablet 10 mg (10 mg Oral Given 04/28/21 1345)  sodium chloride 0.9 % bolus 1,000 mL (1,000 mLs Intravenous New Bag/Given 04/28/21 1344)  iohexol (OMNIPAQUE) 350 MG/ML injection 75 mL (75 mLs Intravenous Contrast Given 04/28/21 1541)     IMPRESSION / MDM / ASSESSMENT AND PLAN / ED COURSE  I reviewed the triage vital signs and the nursing notes.                              Differential diagnosis includes, but is not limited to, endometritis, UTI, retained products of conception, diverticulitis, appendicitis, normal postpartum cramping, migraine headache, viral syndrome, pulmonary embolism, pneumonia  The patient is a 21 year old female who is postpartum about 5 days who presents with  multiple symptoms but primarily lower abdominal cramping and vaginal bleeding.  She is tachycardic on arrival to 120s, rate improved to 100s at the time of my evaluation.  Vital signs otherwise within normal limits.  Of note she is not hypertensive to suggest preeclampsia.  Patient appears well on exam her neurologic exam is normal.  She does have lower abdominal tenderness to palpation without guarding.  Reviewed her blood work which is  notable for leukocytosis of 16 and a hemoglobin of 9.3.  Despite her vaginal bleeding her hemoglobin is actually higher today than it was at discharge which was 8.3 on 1/1.  Given the bleeding abdominal cramping and leukocytosis and the reported fever at home I am concerned for endometritis.  We will also obtain a pelvic ultrasound to rule out retained products of conception and send a beta hCG.  In terms of her headache, I have low suspicion for preeclampsia/acute intracranial process given her normal neurologic exam and how well she appears.  May be in the setting of lack of sleep relating to having a new baby at home versus migraine versus viral syndrome.  Will treat supportively with a migraine cocktail, Toradol and Reglan.  In terms of the chest pain she is tachycardic and postpartum which raises concern for pulmonary embolism.  Her EKG has sinus tachycardia without acute changes, troponin is negative and her chest x-ray does not show anything obvious.  We will send a D-dimer.  D-dimer was elevated so CT angio of the chest was obtained which I reviewed and is negative for pulmonary embolism.  Patient's hCG is 388 and a pelvic ultrasound does not show any retained products.  Lactate is normal.  With her elevated white blood cell count, fever at home and lower abdominal pain I am concerned about endometritis.  This is largely a clinical diagnosis.  Discussed with OB/GYN on-call who agrees that this is certainly consideration and we both agree that she is appropriate for  outpatient antibiotics given she is not septic pain is relatively well controlled or instability presentation.  He recommends Augmentin given she was GBS positive.  We will give 5-7 days of antibiotics.  OB/GYN consult and did note that if pain is not improving she should have strict return precautions to come back if she could have a septic thrombophlebitis in the pelvis.  Discussed results with the patient and did discuss that if she continues to have fevers and pain is not improving on the antibiotics that she should come back to the emergency department.  Again because she is not septic and this is a delayed presentation I feel that she is appropriate for discharge.   FINAL CLINICAL IMPRESSION(S) / ED DIAGNOSES   Final diagnoses:  Postpartum endometritis     Rx / DC Orders   ED Discharge Orders          Ordered    amoxicillin-clavulanate (AUGMENTIN) 875-125 MG tablet  2 times daily        04/28/21 1635    fluconazole (DIFLUCAN) 150 MG tablet  Daily        04/28/21 1635             Note:  This document was prepared using Dragon voice recognition software and may include unintentional dictation errors.   Georga Hacking, MD 04/28/21 [phone]

## 2021-05-03 DIAGNOSIS — F1120 Opioid dependence, uncomplicated: Secondary | ICD-10-CM | POA: Diagnosis not present

## 2021-05-03 DIAGNOSIS — Z7151 Drug abuse counseling and surveillance of drug abuser: Secondary | ICD-10-CM | POA: Diagnosis not present

## 2021-05-10 DIAGNOSIS — Z7151 Drug abuse counseling and surveillance of drug abuser: Secondary | ICD-10-CM | POA: Diagnosis not present

## 2021-05-10 DIAGNOSIS — F1220 Cannabis dependence, uncomplicated: Secondary | ICD-10-CM | POA: Diagnosis not present

## 2021-05-18 DIAGNOSIS — Z7151 Drug abuse counseling and surveillance of drug abuser: Secondary | ICD-10-CM | POA: Diagnosis not present

## 2021-05-18 DIAGNOSIS — F1220 Cannabis dependence, uncomplicated: Secondary | ICD-10-CM | POA: Diagnosis not present

## 2021-05-23 NOTE — Progress Notes (Deleted)
°  [facility] Postpartum Visit   Alyssa Howell is a 21 y.o. G1P0 presenting for a postpartum visit.  She has the following concerns today: *** She delivered via *** at ***.  She reports her vaginal bleeding is ***. She is *** feeding her infant. She feels she is bonding well. She is considering *** for contraception.   Edinburgh Postnatal Depression Scale: *** (10 or higher is positive)  Reviewed pregnancy and delivery course ***.  - Suspected fetal double aortic arch Velamentous insertion of umbilical cord There were no vitals filed for this visit. Exam: *** Pelvic exam: ***   A/P:  Postpartum visit: patient is *** weeks postpartum following a *** delivery. -Discussed patients delivery*** and complications -Patient had a *** degree laceration, perineal healing reviewed. Patient expressed understanding -Patient has urinary incontinence? {yes/no:20286}*** Patient was referred to pelvic floor PT  -Patient {ACTION; IS/IS QMV:[id number]} safe to resume physical and sexual activity -Patient {DOES_DOES BMW:41324} want a pregnancy in the next year.  Desired family size is {NUMBER 1-10:22536} children.  -Reviewed forms of contraception in tiered fashion. Patient desired {PLAN CONTRACEPTION:313102} today.   -Return to sexual activity and contraception discussed as above.  -Discussed birth spacing of 18 months -Breastfeeding: {yes/no:20286}, provided support of decision and resources as indicated  -Mood: ***  -Discussed sleep and fatigue management and encouraged family/community support.  -Postpartum vaccines: *** -Need for postpartum diabetes screening:  *** (2 hour glucose tolerance testing between 6-12 weeks postpartum)  -Reviewed prior Pap and she is next due for Pap ***.  -Return in No follow-ups on file.

## 2021-05-24 ENCOUNTER — Encounter: Payer: Medicaid Other | Admitting: Family Medicine

## 2021-05-25 DIAGNOSIS — F1220 Cannabis dependence, uncomplicated: Secondary | ICD-10-CM | POA: Diagnosis not present

## 2021-05-25 DIAGNOSIS — Z7151 Drug abuse counseling and surveillance of drug abuser: Secondary | ICD-10-CM | POA: Diagnosis not present

## 2021-06-01 ENCOUNTER — Other Ambulatory Visit: Payer: Self-pay

## 2021-06-01 ENCOUNTER — Ambulatory Visit (INDEPENDENT_AMBULATORY_CARE_PROVIDER_SITE_OTHER): Payer: Medicaid Other | Admitting: Family Medicine

## 2021-06-01 ENCOUNTER — Encounter: Payer: Self-pay | Admitting: Family Medicine

## 2021-06-01 DIAGNOSIS — F1220 Cannabis dependence, uncomplicated: Secondary | ICD-10-CM | POA: Diagnosis not present

## 2021-06-01 DIAGNOSIS — Z7151 Drug abuse counseling and surveillance of drug abuser: Secondary | ICD-10-CM | POA: Diagnosis not present

## 2021-06-01 DIAGNOSIS — K5900 Constipation, unspecified: Secondary | ICD-10-CM

## 2021-06-01 MED ORDER — POLYETHYLENE GLYCOL 3350 17 GM/SCOOP PO POWD: 17.0000 g | Freq: Every day | ORAL | 1 refills | Status: DC

## 2021-06-01 NOTE — Progress Notes (Signed)
°  [facility] Postpartum Visit   Alyssa Howell is a 21 y.o. G1P0 presenting for a postpartum visit.  She has the following concerns today: hemorroids, today had blood in toilet bowl and when she wiped was on the toilet tissue.  States her stools are sometimes hard.  She does not have rectal pain until she feels her stool descending. She delivered via vaginal at [redacted]w[redacted]d.  She reports no vaginal bleeding. She is formula feeding her infant. She feels she is bonding well. She is considering nothing for contraception.   Edinburgh Postnatal Depression Scale: 5 (10 or higher is positive)  Reviewed pregnancy and delivery course:  -first degree laceration -Suspected fetal double aortic arch -Velamentous insertion of umbilical cord -Traumatic birth experience (tight nuchal, required oxygen at birth, 2-day NICU stay) -PP endometritis  Vitals:   06/01/21 1353  BP: 116/76  Pulse: (!) 103  SpO2: 93%   Exam:  General: Appears well, no acute distress. Age appropriate. Respiratory: normal effort Neuro: alert and oriented Psych: normal affect Pelvic exam: Pelvic exam: normal external genitalia, vulva, vagina, cervix, uterus and adnexa, RECTAL: normal rectal, no masses, DRE declined by patient.   A/P:  Postpartum visit: patient is 5 weeks postpartum following a vaginal delivery. -Discussed patients delivery and complications -Patient had a 1st degree laceration, perineal healing reviewed. Patient expressed understanding -Patient has urinary incontinence? No -Patient is safe to resume physical and sexual activity -Patient does not know if she wants a pregnancy in the next year.   -Reviewed forms of contraception in tiered fashion. Patient desired no method today.   -Return to sexual activity and contraception discussed as above.  -Discussed birth spacing of 18 months -Breastfeeding: No -Mood: Normal  -Discussed sleep and fatigue management and encouraged family/community support.   -Postpartum vaccines: n/a -Need for postpartum diabetes screening:  No  -Reviewed prior Pap and she is next due for Pap when she is 21 years of age.  -Return in No follow-ups on file.  Constipation, unspecified constipation type 1 day of bright red blood per rectum accompanied with hard stools.  No hemorrhoids found on exam.  Patient declined digital rectal exam.  Would like to try MiraLAX and if not resolve plans to follow-up for digital rectal exam.  Return precautions given. - polyethylene glycol powder (GLYCOLAX/MIRALAX) 17 GM/SCOOP powder; Take 17 g by mouth daily.  Dispense: 3350 g; Refill: 1

## 2021-06-01 NOTE — Patient Instructions (Addendum)
Thank for coming in today.  I have prescribed MiraLAX for hard stools.  If you continue to have symptoms and bloody stools please follow-up.  Dr. Salvadore Dom

## 2021-06-08 DIAGNOSIS — Z7151 Drug abuse counseling and surveillance of drug abuser: Secondary | ICD-10-CM | POA: Diagnosis not present

## 2021-06-08 DIAGNOSIS — F1220 Cannabis dependence, uncomplicated: Secondary | ICD-10-CM | POA: Diagnosis not present

## 2021-06-16 DIAGNOSIS — F1220 Cannabis dependence, uncomplicated: Secondary | ICD-10-CM | POA: Diagnosis not present

## 2021-06-16 DIAGNOSIS — Z7151 Drug abuse counseling and surveillance of drug abuser: Secondary | ICD-10-CM | POA: Diagnosis not present

## 2021-07-07 ENCOUNTER — Other Ambulatory Visit (HOSPITAL_COMMUNITY)
Admission: RE | Admit: 2021-07-07 | Discharge: 2021-07-07 | Disposition: A | Discharge status: Home / Self Care | Payer: Medicaid Other | Source: Ambulatory Visit | Attending: Family Medicine | Admitting: Family Medicine

## 2021-07-07 ENCOUNTER — Encounter: Payer: Self-pay | Admitting: Student

## 2021-07-07 ENCOUNTER — Ambulatory Visit (INDEPENDENT_AMBULATORY_CARE_PROVIDER_SITE_OTHER): Payer: Medicaid Other | Admitting: Student

## 2021-07-07 ENCOUNTER — Other Ambulatory Visit: Payer: Self-pay

## 2021-07-07 DIAGNOSIS — N898 Other specified noninflammatory disorders of vagina: Secondary | ICD-10-CM

## 2021-07-07 DIAGNOSIS — N644 Mastodynia: Secondary | ICD-10-CM | POA: Diagnosis not present

## 2021-07-07 DIAGNOSIS — R102 Pelvic and perineal pain: Secondary | ICD-10-CM | POA: Diagnosis not present

## 2021-07-07 LAB — POCT WET PREP (WET MOUNT)
Clue Cells Wet Prep Whiff POC: NEGATIVE
Trichomonas Wet Prep HPF POC: ABSENT

## 2021-07-07 LAB — POCT URINE PREGNANCY: Preg Test, Ur: NEGATIVE

## 2021-07-07 NOTE — Patient Instructions (Signed)
It was a pleasure to see you today! Thank you for choosing [facility] for your primary care. Alyssa Howell was seen for follow up.  ? ?Our plans for today were: ?Continue with vasoline and covering nipples when able, wear looser shirts  ?Try to size for different bra size, may have change sizing after pregnancy  ?Continue to monitor for changes in breast and vaginal pain  ?Follow up in 2 weeks if breast pain does not go away  ?I will call with the results of your tests  ?Congratulations on your new baby!  ? ? ?Best,  ?Amenah Tucci  ? ?

## 2021-07-07 NOTE — Assessment & Plan Note (Signed)
Likely due to chafing against clothing with change in breast size after pregnancy. No evidence of infection on examination, appears to be a mechanical cause. Instructed to use Vasoline as able and to change bra size to assist in better support and prevent chafing. If persists or changes, return in 2 weeks.  ?

## 2021-07-07 NOTE — Assessment & Plan Note (Addendum)
Vaginal pain and discharge after pregnancy that could be related to possible STI or yeast/BV. Will assess with wet prep, GC/Chlamydia in addition to HIV and RPR. Unlikely anatomical as exam appeared normal. However, patient did not allow speculum exam and only allowed blind swab. Would likely need speculum for further assessment if pain persists with no known cause after tests result. No evidence of systemic or infectious symptoms. Will also obtained a urine pregnancy given lack of contraception, with LMP one month ago. Instructed on safe sex practices as patient does not currently desire pregnancies as well as options for pregnancy prevention.  Follow up in 2 weeks if pain persists without known cause.  ?

## 2021-07-07 NOTE — Progress Notes (Signed)
? ? ?SUBJECTIVE:  ? ?CHIEF COMPLAINT / HPI:  ? ?Vaginal Pain/Discharge: Patient is approximately 10 weeks pp from SVD with 1st degree tear. Her vaginal pain started a month ago. It awakens her from her sleep at times and can be sharp. She has discharge that is intermittent and white in color. Sexual history reviewed with the patient. STD exposure: Partner had chlamydia in past and transferred it to the patient, was treated in pregnancy.  Previous history of STD:  Chlamydia, trichomonas. Current symptoms include vaginal discharge: white. Contraception: none. Patient reports that she does not want contraception today. Patient has a h/o yeast vaginitis during previous pregnancies. Next pap smear at age of 37. No UTI sx currently. Pain occurs with intercourse but no bleeding is present. Restarted menstrual cycle, started 06/09/21.  ? ?Sees urology for vesico-ureteral reflux, no currently on medications but has follow up scheduled.  ? ?Breast Pain/Blisters: She has breast pain and blisters specifically on her nipples onset a few days ago. Patient is not breastfeeding or pumping. She pressed on her breast and had what she felt was pus come out. No new detergents, soaps, clothing. She is not currently exercising and does not tend to wear tight clothing.  ? ?PHQ9 SCORE ONLY 07/07/2021 06/01/2021 03/11/2021  ?PHQ-9 Total Score 1 2 1   ? ? ?PERTINENT  PMH / PSH:  ?Patient Active Problem List  ? Diagnosis Date Noted  ? Vaginal pain 07/07/2021  ? Breast tenderness in female 07/07/2021  ? Supervision of other normal pregnancy, antepartum 09/13/2020  ? GERD (gastroesophageal reflux disease) 10/09/2019  ? Marijuana use 02/24/2019  ? Depression, recurrent (HCC) 02/24/2019  ? ? ? ?OBJECTIVE:  ? ?BP 100/60   Pulse 99   Ht 5\' 7"  (1.702 m)   Wt 222 lb (100.7 kg)   LMP 07/23/2020 (Exact Date)   SpO2 95%   Breastfeeding No   BMI 34.77 kg/m?   ?Physical Exam ?Exam conducted with a chaperone present ).  ?Constitutional:    ?   Appearance: Normal appearance.  ?HENT:  ?   Nose: Nose normal.  ?Eyes:  ?   Conjunctiva/sclera: Conjunctivae normal.  ?Cardiovascular:  ?   Rate and Rhythm: Normal rate and regular rhythm.  ?Pulmonary:  ?   Effort: Pulmonary effort is normal.  ?   Breath sounds: Normal breath sounds.  ?Chest:  ?Breasts: ?   Breasts are symmetrical.  ?   Right: Tenderness (nipple) present.  ?   Left: Tenderness (nipple) present.  ?   Comments: Small erythematous blisters on nipple, unable to express any discharge. No evidence of swelling or purulence.  ?Abdominal:  ?   General: Abdomen is flat. Bowel sounds are normal. There is no distension.  ?   Palpations: Abdomen is soft. There is no mass.  ?Genitourinary: ?   Exam position: Lithotomy position.  ?   Pubic Area: No rash.   ?   Labia:     ?   Right: No rash, tenderness or lesion.     ?   Left: No rash, tenderness or lesion.   ?   Urethra: No urethral pain.  ?   Comments: No labial lesions or abnormalities, presence of white discharge at vaginal opening. Tear appears to have healed appropriately.  ?Musculoskeletal:  ?   Cervical back: Normal range of motion.  ?Skin: ?   General: Skin is warm.  ?Neurological:  ?   Mental Status: She is alert.  ?Psychiatric:     ?  Mood and Affect: Mood normal.     ?   Behavior: Behavior normal.  ? ? ? ?ASSESSMENT/PLAN:  ? ?Vaginal pain ?Vaginal pain and discharge after pregnancy that could be related to possible STI or yeast/BV. Will assess with wet prep, GC/Chlamydia in addition to HIV and RPR. Unlikely anatomical as exam appeared normal. However, patient did not allow speculum exam and only allowed blind swab. Would likely need speculum for further assessment if pain persists with no known cause after tests result. No evidence of systemic or infectious symptoms. Will also obtained a urine pregnancy given lack of contraception, with LMP one month ago. Instructed on safe sex practices as patient does not currently desire pregnancies as well as  options for pregnancy prevention.  Follow up in 2 weeks if pain persists without known cause.  ? ?Breast tenderness in female ?Likely due to chafing against clothing with change in breast size after pregnancy. No evidence of infection on examination, appears to be a mechanical cause. Instructed to use Vasoline as able and to change bra size to assist in better support and prevent chafing. If persists or changes, return in 2 weeks.  ?  ? ?Alfredo Martinez, MD ?[facility]  ? ?

## 2021-07-08 ENCOUNTER — Encounter: Payer: Self-pay | Admitting: Obstetrics

## 2021-07-08 ENCOUNTER — Ambulatory Visit (INDEPENDENT_AMBULATORY_CARE_PROVIDER_SITE_OTHER): Payer: Medicaid Other | Admitting: Obstetrics

## 2021-07-08 VITALS — BP 188/80 | HR 97 | Ht 67.0 in | Wt 221.5 lb

## 2021-07-08 DIAGNOSIS — R102 Pelvic and perineal pain: Secondary | ICD-10-CM

## 2021-07-08 LAB — HIV ANTIBODY (ROUTINE TESTING W REFLEX): HIV Screen 4th Generation wRfx: NONREACTIVE

## 2021-07-08 LAB — CERVICOVAGINAL ANCILLARY ONLY
Chlamydia: NEGATIVE
Comment: NEGATIVE
Comment: NEGATIVE
Comment: NORMAL
Neisseria Gonorrhea: NEGATIVE
Trichomonas: NEGATIVE

## 2021-07-08 LAB — RPR: RPR Ser Ql: NONREACTIVE

## 2021-07-08 NOTE — Progress Notes (Signed)
GYN ENCOUNTER ? ?Encounter for Vaginal Pain ? ?Subjective ? ?HPI: Alyssa Howell is a 21 y.o. G1P0 who presents today for evaluation of vaginal pain. Shoua is s/p NSVD on 04/23/21 with a first degree laceration. Her postpartum course was complicated by endometritis. She reports that she has been having sharp, stabbing pain in her vagina, specifically during and after intercourse. She was seen yesterday and evaluated for STIs. She denies discharge, bleeding, irritation, and lesions. She denies urinary symptoms. ? ?Past Medical History:  ?Diagnosis Date  ? ADHD   ? Depression   ? Kidney stones   ? Urinary reflux   ? Vesico-ureteral reflux   ? ?Past Surgical History:  ?Procedure Laterality Date  ? CYSTOSCOPY/URETEROSCOPY/HOLMIUM LASER/STENT PLACEMENT Right 08/02/2020  ? Procedure: CYSTOSCOPY/URETEROSCOPY/HOLMIUM LASER/STENT PLACEMENT;  Surgeon: Vanna Scotland, MD;  Location: [facility] ORS;  Service: Urology;  Laterality: Right;  ? kidney stones    ? TONSILLECTOMY    ? ?OB History   ? ? Gravida  ?1  ? Para  ?   ? Term  ?   ? Preterm  ?   ? AB  ?   ? Living  ?   ?  ? ? SAB  ?   ? IAB  ?   ? Ectopic  ?   ? Multiple  ?   ? Live Births  ?   ?   ?  ?  ? ?Allergies  ?Allergen Reactions  ? Abilify [Aripiprazole] Other (See Comments)  ?  Seizures  ? ? ? ?Negative except vaginal pain ? ?Objective ? ?BP (!) 188/80   Pulse 97   Ht 5\' 7"  (1.702 m)   Wt 221 lb 8 oz (100.5 kg)   LMP 07/23/2020 (Exact Date)   Breastfeeding No   BMI 34.69 kg/m?  ? ?Pelvic: right labia appears slightly erythematous. On inspection, there is an area inside the right inner labia that appears to have healed but is not completely approximated. Painful to palpation. No redness, swelling or irritation noted. ? ?Assessment ?1) Pain from incompletely approximated vaginal laceration ? ?Plan ?1) Avoid intercourse for 1-2 weeks if possible. Apply lidocaine gel approximately 5 minutes before intercourse if needed. Use lubrication with intercourse.  ? ?If no  improvement in 2-3 weeks, consider consult with MD or referral to pelvic PT. ? ? ?09/22/2020, CNM ? ?

## 2021-08-03 ENCOUNTER — Other Ambulatory Visit: Payer: Self-pay | Admitting: Certified Nurse Midwife

## 2021-08-03 ENCOUNTER — Ambulatory Visit: Payer: Medicaid Other | Admitting: Certified Nurse Midwife

## 2021-08-03 DIAGNOSIS — N941 Unspecified dyspareunia: Secondary | ICD-10-CM

## 2021-08-03 NOTE — Progress Notes (Signed)
Pt saw missy for dyspareunia March, she continues to have pain and is requesting referral for pelvic floor PT as discussed with Missy. Orders placed.  ? ?Doreene Burke, CNM  ?

## 2021-08-08 DIAGNOSIS — S93621A Sprain of tarsometatarsal ligament of right foot, initial encounter: Secondary | ICD-10-CM | POA: Diagnosis not present

## 2021-08-08 DIAGNOSIS — M79671 Pain in right foot: Secondary | ICD-10-CM | POA: Diagnosis not present

## 2021-08-31 ENCOUNTER — Ambulatory Visit: Payer: Medicaid Other | Admitting: Urology

## 2021-08-31 NOTE — Progress Notes (Incomplete)
? ?08/31/21 ?5:51 AM  ? ?Alyssa Howell ?[birth year] ?[id number] ? ?Referring provider:  ?Westley Chandler, MD ?[address] ?[place],  [place] [address] ?No chief complaint on file. ? ? ? ? ?HPI: ?Alyssa Howell is a 21 y.o.female with a personal history of nephrolithiasis  ,who presents today for a 1 year follow-up with [facility].  ? ?She is s/p ureteroscopy. Notably, the time of ureteroscopy, the ureter was found to be relatively tight ? ?Her most recently upper tract imaging in 01/2021 in the form of CT abdomen and pelvis visualized Left kidney contains proximally 6 punctate stones 1 mm in size or smaller. No hydroureteronephrosis on the left. Right kidney contains proximally ?8 small calcifications, the largest in the midportion measuring 2.5 mm. ? ?She was scheduled to undergo a procedure but it was canceled.  ? ?PMH: ?Past Medical History:  ?Diagnosis Date  ? ADHD   ? Depression   ? Kidney stones   ? Urinary reflux   ? Vesico-ureteral reflux   ? ? ?Surgical History: ?Past Surgical History:  ?Procedure Laterality Date  ? CYSTOSCOPY/URETEROSCOPY/HOLMIUM LASER/STENT PLACEMENT Right 08/02/2020  ? Procedure: CYSTOSCOPY/URETEROSCOPY/HOLMIUM LASER/STENT PLACEMENT;  Surgeon: Vanna Scotland, MD;  Location: [facility] ORS;  Service: Urology;  Laterality: Right;  ? kidney stones    ? TONSILLECTOMY    ? ? ?Home Medications:  ?Allergies as of 08/31/2021   ? ?   Reactions  ? Abilify [aripiprazole] Other (See Comments)  ? Seizures  ? ?  ? ?  ?Medication List  ?  ? ?  ? Accurate as of Aug 31, 2021  5:51 AM. If you have any questions, ask your nurse or doctor.  ?  ?  ? ?  ? ?famotidine 40 MG tablet ?Commonly known as: Pepcid ?Take 1 tablet (40 mg total) by mouth 2 (two) times daily. ?  ?ondansetron 4 MG tablet ?Commonly known as: Zofran ?Take 1 tablet (4 mg total) by mouth every 8 (eight) hours as needed for nausea or vomiting. ?  ?oxyCODONE-acetaminophen 5-325 MG tablet ?Commonly known as: PERCOCET/ROXICET ?Take 1-2 tablets by mouth every 6  (six) hours as needed for severe pain. ?  ?phenazopyridine 200 MG tablet ?Commonly known as: Pyridium ?Take 1 tablet (200 mg total) by mouth 3 (three) times daily as needed for pain. ?  ?polyethylene glycol powder 17 GM/SCOOP powder ?Commonly known as: GLYCOLAX/MIRALAX ?Take 17 g by mouth daily. ?  ?prenatal multivitamin Tabs tablet ?Take 1 tablet by mouth daily at 12 noon. ?  ?tamsulosin 0.4 MG Caps capsule ?Commonly known as: FLOMAX ?Take 1 capsule (0.4 mg total) by mouth daily. ?  ? ?  ? ? ?Allergies:  ?Allergies  ?Allergen Reactions  ? Abilify [Aripiprazole] Other (See Comments)  ?  Seizures  ? ? ?Family History: ?Family History  ?Problem Relation Age of Onset  ? Hepatitis C Mother   ? Valvular heart disease Mother   ? Healthy Father   ? Breast cancer Maternal Grandmother   ? Diabetes Other   ? Heart attack Other   ? ? ?Social History:  reports that she has never smoked. She has never used smokeless tobacco. She reports that she does not currently use alcohol. She reports that she does not currently use drugs after having used the following drugs: Marijuana. ? ? ?Physical Exam: ?There were no vitals taken for this visit.  ?Constitutional:  Alert and oriented, No acute distress. ?HEENT: [place] AT, moist mucus membranes.  Trachea midline, no masses. ?Cardiovascular: No clubbing, cyanosis, or  edema. ?Respiratory: Normal respiratory effort, no increased work of breathing. ?Skin: No rashes, bruises or suspicious lesions. ?Neurologic: Grossly intact, no focal deficits, moving all 4 extremities. ?Psychiatric: Normal mood and affect. ? ?Laboratory Data: ? ?Lab Results  ?Component Value Date  ? CREATININE 0.68 04/28/2021  ? ?Lab Results  ?Component Value Date  ? HGBA1C 5.4 02/24/2019  ? ? ?Urinalysis ? ? ?Pertinent Imaging: ? ? ? ?Assessment & Plan:   ? ? ?No follow-ups on file. ? ?I,Kailey Littlejohn,acting as a scribe for Vanna Scotland, MD.,have documented all relevant documentation on the behalf of Vanna Scotland, MD,as  directed by  Vanna Scotland, MD while in the presence of Vanna Scotland, MD. ? ? ?[facility] ?[address], [address] ?[place], [place] [address] ?([phone] ? ?

## 2021-09-02 ENCOUNTER — Encounter: Payer: Medicaid Other | Admitting: Obstetrics

## 2021-09-02 ENCOUNTER — Encounter: Payer: Self-pay | Admitting: Urology

## 2021-09-27 ENCOUNTER — Encounter: Payer: Self-pay | Admitting: *Deleted

## 2021-10-08 ENCOUNTER — Other Ambulatory Visit: Payer: Self-pay

## 2021-10-08 ENCOUNTER — Emergency Department (HOSPITAL_COMMUNITY)
Admission: EM | Admit: 2021-10-08 | Discharge: 2021-10-08 | Disposition: A | Discharge status: Home / Self Care | Payer: Medicaid Other | Attending: Emergency Medicine | Admitting: Emergency Medicine

## 2021-10-08 ENCOUNTER — Encounter (HOSPITAL_COMMUNITY): Payer: Self-pay

## 2021-10-08 DIAGNOSIS — L03818 Cellulitis of other sites: Secondary | ICD-10-CM

## 2021-10-08 DIAGNOSIS — N764 Abscess of vulva: Secondary | ICD-10-CM | POA: Insufficient documentation

## 2021-10-08 DIAGNOSIS — N898 Other specified noninflammatory disorders of vagina: Secondary | ICD-10-CM | POA: Diagnosis present

## 2021-10-08 DIAGNOSIS — B9689 Other specified bacterial agents as the cause of diseases classified elsewhere: Secondary | ICD-10-CM | POA: Diagnosis not present

## 2021-10-08 DIAGNOSIS — N760 Acute vaginitis: Secondary | ICD-10-CM | POA: Diagnosis not present

## 2021-10-08 DIAGNOSIS — Z113 Encounter for screening for infections with a predominantly sexual mode of transmission: Secondary | ICD-10-CM

## 2021-10-08 LAB — HIV ANTIBODY (ROUTINE TESTING W REFLEX): HIV Screen 4th Generation wRfx: NONREACTIVE

## 2021-10-08 LAB — URINALYSIS, ROUTINE W REFLEX MICROSCOPIC
Bilirubin Urine: NEGATIVE
Glucose, UA: NEGATIVE mg/dL
Hgb urine dipstick: NEGATIVE
Ketones, ur: NEGATIVE mg/dL
Nitrite: NEGATIVE
Protein, ur: NEGATIVE mg/dL
Specific Gravity, Urine: 1.025 (ref 1.005–1.030)
pH: 5 (ref 5.0–8.0)

## 2021-10-08 LAB — WET PREP, GENITAL
Sperm: NONE SEEN
Trich, Wet Prep: NONE SEEN
WBC, Wet Prep HPF POC: <10 (ref <10)
Yeast Wet Prep HPF POC: NONE SEEN

## 2021-10-08 LAB — RPR: RPR Ser Ql: NONREACTIVE

## 2021-10-08 LAB — I-STAT BETA HCG BLOOD, ED (MC, WL, AP ONLY): I-stat hCG, quantitative: <5 m[IU]/mL (ref <5)

## 2021-10-08 MED ORDER — FLUCONAZOLE 150 MG PO TABS: 150.0000 mg | ORAL_TABLET | Freq: Once | ORAL | 0 refills | Status: AC

## 2021-10-08 MED ORDER — METRONIDAZOLE 500 MG PO TABS: 500.0000 mg | ORAL_TABLET | Freq: Two times a day (BID) | ORAL | 0 refills | Status: DC

## 2021-10-08 MED ORDER — LIDOCAINE-EPINEPHRINE (PF) 2 %-1:200000 IJ SOLN
10.0000 mL | Freq: Once | INTRAMUSCULAR | Status: DC
  Filled 2021-10-08: qty 20

## 2021-10-08 MED ORDER — CEPHALEXIN 500 MG PO CAPS: 500.0000 mg | ORAL_CAPSULE | Freq: Four times a day (QID) | ORAL | 0 refills | Status: DC

## 2021-10-08 NOTE — Discharge Instructions (Signed)
You came to the emergency department today to be evaluated for your labial swelling.  An incision and drainage was performed however no fluid was found.  The swelling may be due to a cyst or infection.  Due to this we have started you on the antibiotic Keflex.  Please take this medication as prescribed.  Additionally please follow-up closely with your OB/GYN provider for repeat evaluation.  I have given you information for an OB/GYN if you do not have one.    Your wet prep was positive for bacterial vaginosis.  Due to this she was started on the medication Flagyl.  Please do not drink while on this medication as well as 48 hours after completing the medication.  If you drink while on this medication it can make you violently ill.  Today you have been tested for gonorrhea and chlamydia.  The test to determine if you have these will take a few days. They will only call you if your tests come back positive, no news is good news. In the result that your tests are positive we will need to go to your primary care doctor, urgent care, or the health department for further treatment.  You also have tests pending for HIV and syphilis.  You should receive a call if these results are positive.  On your discharge information there is also instructions on how to sign up for my chart.  Please complete this so that you can follow the results on your own also.  If positive for HIV or syphilis please got to [facility] department, your primary care doctor, or urgent care to start treatment.  Please do not have any sexual activity for the next two weeks.  After that please make sure that you always use a condom every time you have sex.  If you have any new or concerning symptoms please seek additional medical care and evaluation.

## 2021-10-08 NOTE — ED Triage Notes (Signed)
Patient complains of abscess to labia for several days with pain. Patient attempted to drain herself.

## 2021-10-08 NOTE — ED Provider Notes (Signed)
[facility] EMERGENCY DEPARTMENT Provider Note   CSN: [id number] Arrival date & time: 10/08/21  4765     History  No chief complaint on file.   Alyssa Howell is a 21 y.o. female with past medical history of ADHD, depression, vesicular ureteral reflux.  Presents to the emergency department with a complaint of swelling to right labia.  Patient reports that yesterday she was shaving her groin.  After this she noticed swelling to her right labia.  Patient reports that swelling has gone progressively worse.  Patient states that swelling is exquisitely painful.  Pain is worse with touch and movement.  Patient was "messing with it," this morning and saw minimal bleeding from the area.  Denies any purulent discharge.  Patient endorses vaginal discharge however states this is normal for her and unchanged.  Patient does note some nausea related to her pain.  Denies any fever, chills, pelvic pain, vaginal bleeding, dysuria, hematuria, urinary urgency, urinary frequency, abdominal pain, vomiting.  Patient is sexually active with multiple female partners.  Patient does not always use condoms with penetrative vaginal intercourse.  Patient is not on any forms of birth control.  Last menstrual period 6/1 to 6/7.    HPI     Home Medications Prior to Admission medications   Medication Sig Start Date End Date Taking? Authorizing Provider  famotidine (PEPCID) 40 MG tablet Take 1 tablet (40 mg total) by mouth 2 (two) times daily. Patient not taking: Reported on 07/07/2021 02/25/21   Westley Chandler, MD  ondansetron (ZOFRAN) 4 MG tablet Take 1 tablet (4 mg total) by mouth every 8 (eight) hours as needed for nausea or vomiting. Patient not taking: Reported on 07/07/2021 04/15/21   Hurshel Party, CNM  oxyCODONE-acetaminophen (PERCOCET/ROXICET) 5-325 MG tablet Take 1-2 tablets by mouth every 6 (six) hours as needed for severe pain. Patient not taking: Reported on 07/07/2021 04/15/21    Hurshel Party, CNM  phenazopyridine (PYRIDIUM) 200 MG tablet Take 1 tablet (200 mg total) by mouth 3 (three) times daily as needed for pain. Patient not taking: Reported on 07/07/2021 04/15/21   Sharen Counter A, CNM  polyethylene glycol powder (GLYCOLAX/MIRALAX) 17 GM/SCOOP powder Take 17 g by mouth daily. Patient not taking: Reported on 07/07/2021 06/01/21   Autry-Lott, Randa Evens, DO  Prenatal Vit-Fe Fumarate-FA (PRENATAL MULTIVITAMIN) TABS tablet Take 1 tablet by mouth daily at 12 noon. Patient not taking: Reported on 07/08/2021 09/13/20   Maury Dus, MD  tamsulosin (FLOMAX) 0.4 MG CAPS capsule Take 1 capsule (0.4 mg total) by mouth daily. Patient not taking: Reported on 07/07/2021 04/15/21   Hurshel Party, CNM      Allergies    Abilify [aripiprazole]    Review of Systems   Review of Systems  Constitutional:  Negative for chills and fever.  Gastrointestinal:  Negative for abdominal pain, nausea and vomiting.  Genitourinary:  Positive for vaginal discharge and vaginal pain. Negative for difficulty urinating, dysuria, flank pain, frequency, genital sores, hematuria, pelvic pain, urgency and vaginal bleeding.  Musculoskeletal:  Negative for back pain and neck pain.  Skin:  Negative for color change and rash.  Neurological:  Negative for dizziness, syncope, light-headedness and headaches.  Psychiatric/Behavioral:  Negative for confusion.     Physical Exam Updated Vital Signs BP 122/78 (BP Location: Right Arm)   Pulse (!) 101   Temp 98.2 F (36.8 C) (Oral)   Resp 16   SpO2 97%  Physical Exam Vitals and nursing note reviewed. Exam  conducted with a chaperone present (Female nurse tech).  Constitutional:      General: She is not in acute distress.    Appearance: She is not ill-appearing, toxic-appearing or diaphoretic.  HENT:     Head: Normocephalic.  Eyes:     General: No scleral icterus.       Right eye: No discharge.        Left eye: No discharge.   Cardiovascular:     Rate and Rhythm: Normal rate.  Pulmonary:     Effort: Pulmonary effort is normal.  Abdominal:     General: Abdomen is flat. There is no distension. There are no signs of injury.     Palpations: Abdomen is soft.     Tenderness: There is no abdominal tenderness. There is no guarding or rebound.     Hernia: There is no hernia in the left inguinal area or right inguinal area.  Genitourinary:    Exam position: Lithotomy position.     Pubic Area: No rash or pubic lice.      Tanner stage (genital): 5.     Labia:        Right: No rash, tenderness, lesion or injury.        Left: No rash, tenderness, lesion or injury.      Vagina: No signs of injury and foreign body. Vaginal discharge present. No erythema, tenderness, bleeding, lesions or prolapsed vaginal walls.     Cervix: No cervical motion tenderness, discharge, friability, lesion, erythema, cervical bleeding or eversion.     Comments: Significant swelling to groin adjacent to right labia.  Exquisitely tender to touch.  Due to patient's tenderness unable to fully evaluate for fluctuance.  Copious amounts of milky white discharge noted within vaginal vault. Lymphadenopathy:     Lower Body: No right inguinal adenopathy. No left inguinal adenopathy.  Skin:    General: Skin is warm and dry.  Neurological:     General: No focal deficit present.     Mental Status: She is alert.  Psychiatric:        Behavior: Behavior is cooperative.     ED Results / Procedures / Treatments   Labs (all labs ordered are listed, but only abnormal results are displayed) Labs Reviewed  WET PREP, GENITAL - Abnormal; Notable for the following components:      Result Value   Clue Cells Wet Prep HPF POC PRESENT (*)    All other components within normal limits  URINALYSIS, ROUTINE W REFLEX MICROSCOPIC - Abnormal; Notable for the following components:   APPearance HAZY (*)    Leukocytes,Ua SMALL (*)    Bacteria, UA MANY (*)    All other  components within normal limits  URINE CULTURE  RPR  HIV ANTIBODY (ROUTINE TESTING W REFLEX)  I-STAT BETA HCG BLOOD, ED ([facility], [facility], [facility] ONLY)  GC/CHLAMYDIA PROBE AMP ([place]) NOT AT [facility]    EKG None  Radiology No results found.  Procedures Procedures    Medications Ordered in ED Medications  lidocaine-EPINEPHrine (XYLOCAINE W/EPI) 2 %-1:200000 (PF) injection 10 mL (has no administration in time range)    ED Course/ Medical Decision Making/ A&P                           Medical Decision Making Amount and/or Complexity of Data Reviewed Labs: ordered.  Risk Prescription drug management.   Alert 21 year old female in no acute distress, nontoxic-appearing.  Presents to the emergency department with  a chief complaint of right labial abscess.  Information is obtained from patient.  Past medical records were reviewed including previous provider notes from specialist, labs, and imaging.   On physical exam patient does have swelling adjacent to right labia concerning for possible abscess.  Shared decision-making with patient about incision and drainage.  Patient agrees for incision and drainage.  Incision and drainage as performed above.  No discharge was expressed on I&D, concern for possible cyst versus swelling secondary to cellulitis.    Patient is requesting testing for STI as she has had multiple sexual partners and has not always used condoms with penetrative vaginal intercourse.  I personally viewed and interpreted patient's lab results.  Pertinent findings include: -Wet prep positive for clue cells -Urinalysis concerning for urinary tract infection. -I-STAT beta-hCG unremarkable  While there is some contamination and patient's urinalysis we will choose antibiotics to cover for UTI and cellulitis.  Will prescribe patient with 10-day course of Keflex.  Patient to follow-up closely with her OB/GYN provider for repeat assessment.  We will give patient a short course of  Percocet pain medication to help with pain associated from swelling and tenderness.  Patient to be treated with 7-day course of Flagyl for bacterial vaginosis.  Patient requests dose of Diflucan as she frequently develops yeast infections when taking antibiotics.  We will give patient 1 dose of oral Diflucan to take if yeast infection symptoms do arise.  Patient was offered empiric treatment for gonorrhea and chlamydia however declines at this time.  Patient was advised to follow-up with primary care doctor, urgent care, OB/GYN, health department, or or return to the emergency department if she is contacted with positive test results.  Patient was discussed with and evaluated by Dr. Donnald Garre.   Based on patient's chief complaint, I considered admission might be necessary, however after reassuring ED workup feel patient is reasonable for discharge.  Discussed results, findings, treatment and follow up. Patient advised of return precautions. Patient verbalized understanding and agreed with plan.  Portions of this note were generated with Scientist, clinical (histocompatibility and immunogenetics). Dictation errors may occur despite best attempts at proofreading.         Final Clinical Impression(s) / ED Diagnoses Final diagnoses:  None    Rx / DC Orders ED Discharge Orders          Ordered    cephALEXin (KEFLEX) 500 MG capsule  4 times daily        10/08/21 1055    metroNIDAZOLE (FLAGYL) 500 MG tablet  2 times daily        10/08/21 1055    fluconazole (DIFLUCAN) 150 MG tablet   Once        10/08/21 [address], PA-C 10/08/21 1611    Arby Barrette, MD 10/21/21 1145

## 2021-10-10 LAB — GC/CHLAMYDIA PROBE AMP (~~LOC~~) NOT AT ARMC
Chlamydia: NEGATIVE
Comment: NEGATIVE
Comment: NORMAL
Neisseria Gonorrhea: NEGATIVE

## 2021-10-10 LAB — URINE CULTURE: Culture: >=100000 — AB

## 2021-10-11 ENCOUNTER — Telehealth: Payer: Self-pay

## 2021-10-11 NOTE — Telephone Encounter (Signed)
Post ED Visit - Positive Culture Follow-up  Culture report reviewed by antimicrobial stewardship pharmacist: [facility] [x]  [place], [place].D. []  [place], Pharm.D., BCPS AQ-ID []  , Pharm.D., BCPS []  Celedonio Miyamoto, Pharm.D., BCPS []  [place], Garvin Fila.D., BCPS, AAHIVP []  , Pharm.D., BCPS, AAHIVP []  Georgina Pillion, PharmD, BCPS []  , PharmD, BCPS []  [place], PharmD, BCPS []  [address], PharmD []  , PharmD, BCPS []  Estella Husk, PharmD  Pharmacy Team []  Lysle Pearl, PharmD []  , PharmD []  Phillips Climes, PharmD []  , Rph []  Agapito Games) , PharmD []  Verlan Friends, PharmD []  , PharmD []  Mervyn Gay, PharmD []  , PharmD []  Vinnie Level, PharmD []  [facility], PharmD []  , PharmD []  Len Childs, PharmD   Positive urine culture Treated with Cephalexin, organism sensitive to the same and no further patient follow-up is required at this time.  10/11/2021, 10:36 AM

## 2021-10-12 ENCOUNTER — Ambulatory Visit: Payer: Medicaid Other

## 2021-10-21 ENCOUNTER — Telehealth: Payer: Self-pay | Admitting: *Deleted

## 2021-10-21 DIAGNOSIS — N907 Vulvar cyst: Secondary | ICD-10-CM

## 2021-10-21 NOTE — Telephone Encounter (Signed)
Patient left a message asking for a referral to gynecology for a vaginal cyst. She was seen in the ED about 2 weeks ago and her insurance requires the PCP to place referrals. Will forward to MD to advise. Jayleah Garbers,CMA

## 2021-10-21 NOTE — Telephone Encounter (Signed)
Referral placed. Please let patient know.  Terisa Starr, MD  Family Medicine Teaching Service

## 2021-10-21 NOTE — Telephone Encounter (Signed)
Patient informed and referral sent to [facility].  Ariyonna Twichell,CMA

## 2021-11-23 ENCOUNTER — Encounter: Payer: Medicaid Other | Admitting: Obstetrics and Gynecology

## 2021-11-23 NOTE — Progress Notes (Deleted)
    GYNECOLOGY PROGRESS NOTE  Subjective:    Patient ID: Alyssa Howell, female    DOB: 03/19/2001, 21 y.o.   MRN: [id number]  HPI  Patient is a 21 y.o. G1P0 female who presents for evaluation of labial cyst. She was referred here by her PCP.  {Common ambulatory SmartLinks:19316}  Review of Systems {ros; complete:30496}   Objective:   There were no vitals taken for this visit. There is no height or weight on file to calculate BMI. General appearance: {general exam:16600} Abdomen: {abdominal exam:16834} Pelvic: {pelvic exam:16852::"cervix normal in appearance","external genitalia normal","no adnexal masses or tenderness","no cervical motion tenderness","rectovaginal septum normal","uterus normal size, shape, and consistency","vagina normal without discharge"} Extremities: {extremity exam:5109} Neurologic: {neuro exam:17854}   Assessment:   No diagnosis found.   Plan:   There are no diagnoses linked to this encounter.    Hildred Laser, MD [facility]

## 2021-12-22 ENCOUNTER — Encounter: Payer: Medicaid Other | Admitting: Obstetrics and Gynecology

## 2021-12-27 NOTE — Patient Instructions (Signed)
It was wonderful to see you today.  Today we talked about:  -Your urine pregnancy test today was positive as suspected. -We are doing initial lab work for your pregnancy -Your next visit will be a longer 1 hour appointment -Take prenatal vitamins daily -We will call you with a date and time for a dating ultrasound  Thank you for choosing [facility].   Please call [phone] with any questions about today's appointment.  Please be sure to schedule follow up at the front  desk before you leave today.   Sabino Dick, DO PGY-3 Family Medicine

## 2021-12-27 NOTE — Progress Notes (Unsigned)
    SUBJECTIVE:   CHIEF COMPLAINT / HPI:   Alyssa Howell is a 21 y.o. female who presents to the [facility] clinic today to discuss the following concerns:   Confirm Pregnancy  LMP was 10/22/21. Her periods are every month to every 1.5 months. It was a normal period for her but she does note that her periods have always been slightly off.  She uses an app to track her periods. Has taken 5 at home pregnancy tests, all have been positive. This is not necessarily an intended pregnancy but it is welcomed.   She delivered a baby boy on 04/23/21 at 39 weeks 1 day, this was an induction of labor secondary to fetal cardiac anomaly.  Her baby spent time in the NICU- she states that he was born "dead" due to cord wrapped around his head. They had to perform CPR and he spent 4 days in the NICU- was able to get off oxygen after 18 hours. He is doing well now- she reports follow up of his suspected cardiac anomalies in utero were actually normal.  Her postpartum course was also complicated by endometritis treated with Augmentin.  Notes that she has had problems with nausea in her previous pregnancy but thus far has not had any nausea.  She has not yet started taking prenatal vitamin.  PERTINENT  PMH / PSH:  Past Medical History:  Diagnosis Date   ADHD    Depression    Kidney stones    Urinary reflux    [place]-ureteral reflux     OBJECTIVE:   BP 100/80   Pulse 79   Temp 98.3 F (36.8 C)   Ht 5\' 7"  (1.702 m)   Wt 208 lb 9.6 oz (94.6 kg)   LMP 10/22/2021 (Exact Date)   SpO2 98%   BMI 32.67 kg/m    General: NAD, pleasant, able to participate in exam Respiratory: Normal respiratory effort on room air Abdomen: Bowel sounds present, nontender, non-distended, uterus is not palpable above umbilicus  Skin: warm and dry, no rashes noted Psych: Normal affect and mood  ASSESSMENT/PLAN:   Supervision of other normal pregnancy, antepartum Pregnancy test positive today. Short interval pregnancy- not  planned but welcomed.  She did have problems with recurrent nephrolithiasis in her last pregnancy- will need to be monitored.  -Initial OB labs -Scheduled for 1-hr initial OB  -Dating U/S given irregular menses  -Pregnancy OTC medication list provided -Rx for prenatal vitamins      12/23/2021, DO [facility]

## 2021-12-28 ENCOUNTER — Other Ambulatory Visit: Payer: Self-pay | Admitting: Family Medicine

## 2021-12-28 ENCOUNTER — Ambulatory Visit (INDEPENDENT_AMBULATORY_CARE_PROVIDER_SITE_OTHER): Payer: Medicaid Other | Admitting: Family Medicine

## 2021-12-28 VITALS — BP 100/80 | HR 79 | Temp 98.3°F | Ht 67.0 in | Wt 208.6 lb

## 2021-12-28 DIAGNOSIS — Z3480 Encounter for supervision of other normal pregnancy, unspecified trimester: Secondary | ICD-10-CM | POA: Diagnosis not present

## 2021-12-28 DIAGNOSIS — Z3200 Encounter for pregnancy test, result unknown: Secondary | ICD-10-CM

## 2021-12-28 DIAGNOSIS — Z3490 Encounter for supervision of normal pregnancy, unspecified, unspecified trimester: Secondary | ICD-10-CM | POA: Diagnosis not present

## 2021-12-28 LAB — POCT URINE PREGNANCY: Preg Test, Ur: POSITIVE — AB

## 2021-12-28 MED ORDER — PRENATAL MULTIVITAMIN CH: 1.0000 | ORAL_TABLET | Freq: Every day | ORAL | 3 refills | Status: DC

## 2021-12-28 NOTE — Assessment & Plan Note (Addendum)
Pregnancy test positive today. Short interval pregnancy- not planned but welcomed.  She did have problems with recurrent nephrolithiasis in her last pregnancy- will need to be monitored.  -Initial OB labs -Scheduled for 1-hr initial OB  -Dating U/S given irregular menses  -Pregnancy OTC medication list provided -Rx for prenatal vitamins

## 2021-12-28 NOTE — Addendum Note (Signed)
Addended by: Sabino Dick on: 12/28/2021 04:34 PM   Modules accepted: Orders

## 2021-12-29 ENCOUNTER — Other Ambulatory Visit: Payer: Self-pay | Admitting: Family Medicine

## 2021-12-29 ENCOUNTER — Telehealth: Payer: Self-pay

## 2021-12-29 ENCOUNTER — Encounter (HOSPITAL_COMMUNITY): Payer: Self-pay | Admitting: Obstetrics and Gynecology

## 2021-12-29 ENCOUNTER — Inpatient Hospital Stay (HOSPITAL_COMMUNITY): Payer: Medicaid Other

## 2021-12-29 ENCOUNTER — Inpatient Hospital Stay (HOSPITAL_COMMUNITY)
Admission: AD | Admit: 2021-12-29 | Discharge: 2021-12-29 | Disposition: A | Discharge status: Home / Self Care | Payer: Medicaid Other | Attending: Obstetrics and Gynecology | Admitting: Obstetrics and Gynecology

## 2021-12-29 DIAGNOSIS — R519 Headache, unspecified: Secondary | ICD-10-CM | POA: Diagnosis not present

## 2021-12-29 DIAGNOSIS — O3680X0 Pregnancy with inconclusive fetal viability, not applicable or unspecified: Secondary | ICD-10-CM | POA: Diagnosis not present

## 2021-12-29 DIAGNOSIS — Z3490 Encounter for supervision of normal pregnancy, unspecified, unspecified trimester: Secondary | ICD-10-CM

## 2021-12-29 DIAGNOSIS — Z3A09 9 weeks gestation of pregnancy: Secondary | ICD-10-CM | POA: Insufficient documentation

## 2021-12-29 DIAGNOSIS — R9389 Abnormal findings on diagnostic imaging of other specified body structures: Secondary | ICD-10-CM | POA: Diagnosis not present

## 2021-12-29 DIAGNOSIS — O26891 Other specified pregnancy related conditions, first trimester: Secondary | ICD-10-CM | POA: Diagnosis not present

## 2021-12-29 DIAGNOSIS — O99891 Other specified diseases and conditions complicating pregnancy: Secondary | ICD-10-CM | POA: Diagnosis not present

## 2021-12-29 DIAGNOSIS — R109 Unspecified abdominal pain: Secondary | ICD-10-CM | POA: Diagnosis not present

## 2021-12-29 LAB — COMPREHENSIVE METABOLIC PANEL
ALT: 21 U/L (ref 0–44)
AST: 17 U/L (ref 15–41)
Albumin: 4.2 g/dL (ref 3.5–5.0)
Alkaline Phosphatase: 85 U/L (ref 38–126)
Anion gap: 8 (ref 5–15)
BUN: 8 mg/dL (ref 6–20)
CO2: 23 mmol/L (ref 22–32)
Calcium: 9.6 mg/dL (ref 8.9–10.3)
Chloride: 109 mmol/L (ref 98–111)
Creatinine, Ser: 0.8 mg/dL (ref 0.44–1.00)
GFR, Estimated: >60 mL/min (ref >60)
Glucose, Bld: 91 mg/dL (ref 70–99)
Potassium: 3.6 mmol/L (ref 3.5–5.1)
Sodium: 140 mmol/L (ref 135–145)
Total Bilirubin: 0.4 mg/dL (ref 0.3–1.2)
Total Protein: 7.7 g/dL (ref 6.5–8.1)

## 2021-12-29 LAB — CBC
HCT: 36.8 % (ref 36.0–46.0)
Hemoglobin: 12.2 g/dL (ref 12.0–15.0)
MCH: 31.2 pg (ref 26.0–34.0)
MCHC: 33.2 g/dL (ref 30.0–36.0)
MCV: 94.1 fL (ref 80.0–100.0)
Platelets: 323 10*3/uL (ref 150–400)
RBC: 3.91 MIL/uL (ref 3.87–5.11)
RDW: 14.6 % (ref 11.5–15.5)
WBC: 11 10*3/uL — ABNORMAL HIGH (ref 4.0–10.5)
nRBC: 0 % (ref 0.0–0.2)

## 2021-12-29 LAB — URINALYSIS, ROUTINE W REFLEX MICROSCOPIC
Bilirubin Urine: NEGATIVE
Glucose, UA: NEGATIVE mg/dL
Hgb urine dipstick: NEGATIVE
Ketones, ur: 20 mg/dL — AB
Nitrite: NEGATIVE
Protein, ur: NEGATIVE mg/dL
Specific Gravity, Urine: 1.018 (ref 1.005–1.030)
pH: 5 (ref 5.0–8.0)

## 2021-12-29 LAB — HCG, QUANTITATIVE, PREGNANCY: hCG, Beta Chain, Quant, S: 388 m[IU]/mL — ABNORMAL HIGH (ref <5)

## 2021-12-29 MED ORDER — ACETAMINOPHEN 500 MG PO TABS
1000.0000 mg | ORAL_TABLET | Freq: Four times a day (QID) | ORAL | Status: DC | PRN
  Administered 2021-12-29: 1000 mg via ORAL
  Filled 2021-12-29: qty 2

## 2021-12-29 MED ORDER — LACTATED RINGERS IV BOLUS
1000.0000 mL | Freq: Once | INTRAVENOUS | Status: AC
  Administered 2021-12-29: 1000 mL via INTRAVENOUS

## 2021-12-29 NOTE — Telephone Encounter (Signed)
Alyssa Howell from Radiology calls nurse line in regards to [place] tomorrow.   Alyssa Howell reports the order needs to be changed.  US OB less than 14 weeks with OB transvaginal.   Alyssa Howell reports Doppler not needed.  Will forward to ordering provider.   If you have any questions you can call her at [phone].

## 2021-12-29 NOTE — MAU Note (Signed)
.  Alyssa Howell is a 21 y.o. at [redacted]w[redacted]d here in MAU reporting blurry vision, lightheadedness, h/a, and syncope today. States has had h/a to the point of crying since 0800. No hx of h/a. Has not taken anything for pain. States at 1430 was outside looking at a table and she fainted and fell to the ground. States she did not hurt herself. No vag bleeding. Some cramping in L side.   Onset of complaint: this am Pain score: 7 for h/a; L side is 6 Vitals:   12/29/21 1915  Resp: 17  Temp: 98.4 F (36.9 C)     FHT:n/a Lab orders placed from triage:   u/a

## 2021-12-29 NOTE — MAU Provider Note (Cosign Needed Addendum)
History     [id number]  Arrival date and time: 12/29/21 1851    Chief Complaint  Patient presents with   Dizziness   Loss of Consciousness     HPI Alyssa Howell is a 21 y.o. at [redacted]w[redacted]d by LMP,  here with concerns about a headache and 2 fainting episodes today. She states that she had woken up this morning with a headache that has been present for the whole day. Had 2 fainting episodes, each lasting about 2 mins and each a sudden darkening of her vision. One occurred while she was standing, the other occurred while seating. She has an 9 month old baby and has had irregular menstrual periods, so is not certain about her dates. She has pain in her LLQ  Vaginal bleeding: No No fever, chills or other respiratory concerns. Hasn't felt ill recently.  +nausea, with no vomiting; no urinary urgency or dysuria.  No prior history of migraines. No history of fainting attacks in the past.   A/Positive/-- (09/06 1112)  OB History     Gravida  2   Para      Term      Preterm      AB      Living         SAB      IAB      Ectopic      Multiple      Live Births              Past Medical History:  Diagnosis Date   ADHD    Depression    Kidney stones    Urinary reflux    Vesico-ureteral reflux     Past Surgical History:  Procedure Laterality Date   CYSTOSCOPY/URETEROSCOPY/HOLMIUM LASER/STENT PLACEMENT Right 08/02/2020   Procedure: CYSTOSCOPY/URETEROSCOPY/HOLMIUM LASER/STENT PLACEMENT;  Surgeon: Vanna Scotland, MD;  Location: [facility];  Service: Urology;  Laterality: Right;   kidney stones     TONSILLECTOMY      Family History  Problem Relation Age of Onset   Hepatitis C Mother    Valvular heart disease Mother    Healthy Father    Breast cancer Maternal Grandmother    Diabetes Other    Heart attack Other     Social History   Socioeconomic History   Marital status: Single    Spouse name: Not on file   Number of children: Not on file   Years of  education: Not on file   Highest education level: Not on file  Occupational History   Not on file  Tobacco Use   Smoking status: Never   Smokeless tobacco: Never  Vaping Use   Vaping Use: Never used  Substance and Sexual Activity   Alcohol use: Not Currently   Drug use: Not Currently    Types: Marijuana    Comment: last used the end of September 2022 as of 02/07/2021   Sexual activity: Yes    Partners: Male  Other Topics Concern   Not on file  Social History Narrative   From age [age] the patient lived with adoptive parents, grandparents and foster families.  At age 49 her mom gain custody.  Her name is pronounced Alyssa Howell .  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  She is not sure what she wants to do with her life.  Sexually active.  Does not use condoms 7 partners in 2020.   Social Determinants of Health   Financial Resource Strain: Not on file  Food Insecurity: Not on file  Transportation Needs: Not on file  Physical Activity: Not on file  Stress: Not on file  Social Connections: Not on file  Intimate Partner Violence: Not on file    Allergies  Allergen Reactions   Abilify [Aripiprazole] Other (See Comments)    Seizures    No current facility-administered medications on file prior to encounter.   Current Outpatient Medications on File Prior to Encounter  Medication Sig Dispense Refill   Prenatal Vit-Fe Fumarate-FA (PRENATAL MULTIVITAMIN) TABS tablet Take 1 tablet by mouth daily at 12 noon. (Patient not taking: Reported on 07/08/2021) 90 tablet 2   Prenatal Vit-Fe Fumarate-FA (PRENATAL MULTIVITAMIN) TABS tablet Take 1 tablet by mouth daily at 12 noon. 90 tablet 3     Review of Systems  Eyes:  Negative for blurred vision and double vision.  Gastrointestinal:  Positive for nausea. Negative for vomiting.  Genitourinary:  Negative for dysuria, frequency and urgency.  Musculoskeletal:  Negative for myalgias.  Neurological:  Positive for headaches.  Negative for dizziness and tingling.   Pertinent positives and negative per HPI, all others reviewed and negative  Physical Exam   BP 121/72   Pulse 84   Temp 98.4 F (36.9 C)   Resp 17   Ht 5\' 7"  (1.702 m)   Wt 93.4 kg   LMP 10/22/2021 (Exact Date)   SpO2 100%   BMI 32.26 kg/m   Patient Vitals for the past 24 hrs:  BP Temp Pulse Resp SpO2 Height Weight  12/29/21 1922 121/72 -- -- -- -- -- --  12/29/21 1917 -- -- 84 -- 100 % -- --  12/29/21 1915 -- 98.4 F (36.9 C) -- 17 -- 5\' 7"  (1.702 m) 93.4 kg    Physical Exam Vitals reviewed.  Constitutional:      General: She is not in acute distress.    Appearance: She is well-developed. She is not toxic-appearing.  HENT:     Head: Normocephalic and atraumatic.     Mouth/Throat:     Mouth: Mucous membranes are moist.  Eyes:     Extraocular Movements: Extraocular movements intact.  Cardiovascular:     Rate and Rhythm: Normal rate.  Pulmonary:     Effort: Pulmonary effort is normal. No respiratory distress.  Abdominal:     Palpations: Abdomen is soft.     Tenderness: There is abdominal tenderness (LLQ).  Skin:    General: Skin is warm and dry.  Neurological:     Mental Status: She is alert and oriented to person, place, and time.  Psychiatric:        Mood and Affect: Mood normal.        Behavior: Behavior normal.     Labs Results for orders placed or performed during the hospital encounter of 12/29/21 (from the past 24 hour(s))  Urinalysis, Routine w reflex microscopic Urine, Clean Catch     Status: Abnormal   Collection Time: 12/29/21  7:30 PM  Result Value Ref Range   Color, Urine YELLOW YELLOW   APPearance CLOUDY (A) CLEAR   Specific Gravity, Urine 1.018 1.005 - 1.030   pH 5.0 5.0 - 8.0   Glucose, UA NEGATIVE NEGATIVE mg/dL   Hgb urine dipstick NEGATIVE NEGATIVE   Bilirubin Urine NEGATIVE NEGATIVE   Ketones, ur 20 (A) NEGATIVE mg/dL   Protein, ur NEGATIVE NEGATIVE mg/dL   Nitrite NEGATIVE NEGATIVE    Leukocytes,Ua MODERATE (A) NEGATIVE   RBC / HPF 0-5 0 - 5 RBC/hpf  WBC, UA 6-10 0 - 5 WBC/hpf   Bacteria, UA RARE (A) NONE SEEN   Squamous Epithelial / LPF 21-50 0 - 5   Mucus PRESENT   CBC     Status: Abnormal   Collection Time: 12/29/21  8:05 PM  Result Value Ref Range   WBC 11.0 (H) 4.0 - 10.5 K/uL   RBC 3.91 3.87 - 5.11 MIL/uL   Hemoglobin 12.2 12.0 - 15.0 g/dL   HCT 35.0 09.3 - 81.8 %   MCV 94.1 80.0 - 100.0 fL   MCH 31.2 26.0 - 34.0 pg   MCHC 33.2 30.0 - 36.0 g/dL   RDW 29.9 37.1 - 69.6 %   Platelets 323 150 - 400 K/uL   nRBC 0.0 0.0 - 0.2 %  Comprehensive metabolic panel     Status: None   Collection Time: 12/29/21  8:05 PM  Result Value Ref Range   Sodium 140 135 - 145 mmol/L   Potassium 3.6 3.5 - 5.1 mmol/L   Chloride 109 98 - 111 mmol/L   CO2 23 22 - 32 mmol/L   Glucose, Bld 91 70 - 99 mg/dL   BUN 8 6 - 20 mg/dL   Creatinine, Ser 7.89 0.44 - 1.00 mg/dL   Calcium 9.6 8.9 - 38.1 mg/dL   Total Protein 7.7 6.5 - 8.1 g/dL   Albumin 4.2 3.5 - 5.0 g/dL   AST 17 15 - 41 U/L   ALT 21 0 - 44 U/L   Alkaline Phosphatase 85 38 - 126 U/L   Total Bilirubin 0.4 0.3 - 1.2 mg/dL   GFR, Estimated >01 >75 mL/min   Anion gap 8 5 - 15  hCG, quantitative, pregnancy     Status: Abnormal   Collection Time: 12/29/21  8:05 PM  Result Value Ref Range   hCG, Beta Chain, Quant, S 388 (H) <5 mIU/mL     Imaging US OB LESS THAN 14 WEEKS WITH OB TRANSVAGINAL  Result Date: 12/29/2021 CLINICAL DATA:  Pregnancy and cramping. LMP: 10/22/2021 corresponding to an estimated gestational age of [redacted] weeks, 5 days. EXAM: OBSTETRIC <14 WK [place] AND TRANSVAGINAL OB US TECHNIQUE: Both transabdominal and transvaginal ultrasound examinations were performed for complete evaluation of the gestation as well as the maternal uterus, adnexal regions, and pelvic cul-de-sac. Transvaginal technique was performed to assess early pregnancy. COMPARISON:  None Available. FINDINGS: The uterus is anteverted and appears  unremarkable. The endometrium is thickened measuring approximately 1.3 cm. A small sac-like structure noted in the mid to lower endometrium. No yolk sac or fetal pole identified within this structure. This may represent an early gestational sac, or a blighted ovum. An endometrial cyst or a pseudogestation of an ectopic pregnancy are not excluded. If this cystic structure is a true gestational sac, the estimated gestational age based on mean sac diameter of 3 mm is 5 weeks, 0 days. The ovaries are unremarkable. Small free fluid within the pelvis. IMPRESSION: Sac-like structure within the endometrium as above. Clinical correlation and close follow-up with ultrasound in 7-11 days, or earlier if clinically indicated, recommended. Electronically Signed   By: Elgie Collard M.D.   On: 12/29/2021 20:58     MAU Course  Procedures  Lab Orders         Urinalysis, Routine w reflex microscopic Urine, Clean Catch         CBC         Comprehensive metabolic panel         hCG, quantitative, pregnancy  Meds ordered this encounter  Medications   lactated ringers bolus 1,000 mL    Imaging Orders         US OB LESS THAN 14 WEEKS WITH OB TRANSVAGINAL     MDM moderate 21 y.o. G2P0 @ [redacted]w[redacted]d by LMP (irregular periods) here following a headache all day, and 2 episodes of syncope earlier today. Ddx include tension headache, orthostatic hypotension, dehydration, ectopic pregnancy.   IV LR bolus started. Will check CBC, CMP, hCG and get an [place] to confirm intrauterine pregnancy and for dating.   I signed out her care to Donette Larry, CNM @ 9:15 PM    Alfredia Ferguson, MD/MPH 12/29/21 8:06 PM  Assumed care Labs and [place] reviewed. HA improved. Can use Tylenol, cool packs, rest, etc. US shows sac-like structure, findings could indicate early pregnancy, ectopic pregnancy, or failed pregnancy, discussed with pt. Will follow quant after 48 hrs. Stable for discharge home.   Assessment and Plan   1. Pregnancy,  location unknown   2. Nonintractable headache, unspecified chronicity pattern, unspecified headache type    Discharge home Follow up in MAU on 9/10 (weekend) for stat qhcg Ectopic/SAB precautions Return to ED if syncope recurs  Allergies as of 12/29/2021       Reactions   Abilify [aripiprazole] Other (See Comments)   Seizures        Medication List     TAKE these medications    prenatal multivitamin Tabs tablet Take 1 tablet by mouth daily at 12 noon. What changed: Another medication with the same name was removed. Continue taking this medication, and follow the directions you see here.       Donette Larry, CNM  12/29/2021 9:53 PM

## 2021-12-30 ENCOUNTER — Ambulatory Visit (HOSPITAL_COMMUNITY): Payer: Medicaid Other

## 2021-12-30 ENCOUNTER — Other Ambulatory Visit (HOSPITAL_COMMUNITY): Payer: Medicaid Other

## 2021-12-30 LAB — CBC/D/PLT+RPR+RH+ABO+RUBIGG...
Antibody Screen: NEGATIVE
Basophils Absolute: 0.1 10*3/uL (ref 0.0–0.2)
Basos: 1 %
Bilirubin, UA: NEGATIVE
EOS (ABSOLUTE): 0.3 10*3/uL (ref 0.0–0.4)
Eos: 4 %
Glucose, UA: NEGATIVE
HCV Ab: NONREACTIVE
HIV Screen 4th Generation wRfx: NONREACTIVE
Hematocrit: 37.6 % (ref 34.0–46.6)
Hemoglobin: 12.5 g/dL (ref 11.1–15.9)
Hepatitis B Surface Ag: NEGATIVE
Immature Grans (Abs): 0 10*3/uL (ref 0.0–0.1)
Immature Granulocytes: 0 %
Ketones, UA: NEGATIVE
Lymphocytes Absolute: 1.9 10*3/uL (ref 0.7–3.1)
Lymphs: 23 %
MCH: 30.3 pg (ref 26.6–33.0)
MCHC: 33.2 g/dL (ref 31.5–35.7)
MCV: 91 fL (ref 79–97)
Monocytes Absolute: 0.5 10*3/uL (ref 0.1–0.9)
Monocytes: 6 %
Neutrophils Absolute: 5.3 10*3/uL (ref 1.4–7.0)
Neutrophils: 66 %
Nitrite, UA: NEGATIVE
Platelets: 302 10*3/uL (ref 150–450)
Protein,UA: NEGATIVE
RBC, UA: NEGATIVE
RBC: 4.12 x10E6/uL (ref 3.77–5.28)
RDW: 14.1 % (ref 11.7–15.4)
RPR Ser Ql: NONREACTIVE
Rh Factor: POSITIVE
Rubella Antibodies, IGG: >33 index (ref >0.99)
Specific Gravity, UA: 1.026 (ref 1.005–1.030)
Urobilinogen, Ur: 0.2 mg/dL (ref 0.2–1.0)
WBC: 8.1 10*3/uL (ref 3.4–10.8)
pH, UA: 5.5 (ref 5.0–7.5)

## 2021-12-30 LAB — HCV INTERPRETATION

## 2021-12-30 LAB — MICROSCOPIC EXAMINATION
Casts: NONE SEEN /lpf
Epithelial Cells (non renal): >10 /hpf — AB (ref 0–10)
RBC, Urine: NONE SEEN /hpf (ref 0–2)

## 2021-12-30 LAB — HGB FRACTIONATION CASCADE
Hgb A2: 2.5 % (ref 1.8–3.2)
Hgb A: 97.5 % (ref 96.4–98.8)
Hgb F: 0 % (ref 0.0–2.0)
Hgb S: 0 %

## 2021-12-30 LAB — URINE CULTURE, OB REFLEX

## 2022-01-01 ENCOUNTER — Telehealth: Payer: Self-pay | Admitting: Obstetrics and Gynecology

## 2022-01-01 ENCOUNTER — Inpatient Hospital Stay (HOSPITAL_COMMUNITY)
Admission: AD | Admit: 2022-01-01 | Discharge: 2022-01-01 | Disposition: A | Discharge status: Home / Self Care | Payer: Medicaid Other | Attending: Obstetrics and Gynecology | Admitting: Obstetrics and Gynecology

## 2022-01-01 ENCOUNTER — Other Ambulatory Visit (HOSPITAL_COMMUNITY)
Admit: 2022-01-01 | Discharge: 2022-01-01 | Disposition: A | Discharge status: Home / Self Care | Payer: Medicaid Other | Attending: Obstetrics and Gynecology | Admitting: Obstetrics and Gynecology

## 2022-01-01 DIAGNOSIS — Z3490 Encounter for supervision of normal pregnancy, unspecified, unspecified trimester: Secondary | ICD-10-CM | POA: Insufficient documentation

## 2022-01-01 LAB — HCG, QUANTITATIVE, PREGNANCY: hCG, Beta Chain, Quant, S: 1657 m[IU]/mL — ABNORMAL HIGH (ref <5)

## 2022-01-01 NOTE — MAU Note (Signed)
Pt left after labs were drawn. Did not get triaged. Notified provider.

## 2022-01-01 NOTE — Telephone Encounter (Signed)
Duplicate encounter

## 2022-01-01 NOTE — Telephone Encounter (Signed)
Patient in MAU for repeat HCG labs. She was not triaged or medically screened due to leaving immediately after labs were drawn.  TC to notify patient of appropriately rising HCG level and need to have U/S in 10 days. Patient reports she has an ultrasound scheduled for 01/09/2022. Advised to keep that appointment. No further questions from patient. Patient verbalized an understanding of the plan of care and agrees.   Raelyn Mora, CNM  01/01/2022

## 2022-01-09 ENCOUNTER — Encounter: Payer: Self-pay | Admitting: Family Medicine

## 2022-01-09 DIAGNOSIS — Z3480 Encounter for supervision of other normal pregnancy, unspecified trimester: Secondary | ICD-10-CM

## 2022-01-09 MED ORDER — DOXYLAMINE-PYRIDOXINE 10-10 MG PO TBEC: DELAYED_RELEASE_TABLET | ORAL | 1 refills | Status: DC

## 2022-01-10 ENCOUNTER — Ambulatory Visit (HOSPITAL_COMMUNITY): Payer: Medicaid Other

## 2022-01-13 ENCOUNTER — Other Ambulatory Visit (HOSPITAL_COMMUNITY): Payer: Self-pay

## 2022-01-13 ENCOUNTER — Telehealth: Payer: Self-pay

## 2022-01-13 NOTE — Telephone Encounter (Signed)
Rec'd PA request from pharmacy regarding Diclegis medication. PA not needed.Couldn't get anyone on the phone at pharmacy. Left a voicemail on provider line.   Medicaid covers brand name DICLEGIS. Claim must be ran for brand AND DAW (dispense as written) code 9.

## 2022-01-16 ENCOUNTER — Encounter: Payer: Medicaid Other | Admitting: Family Medicine

## 2022-01-24 ENCOUNTER — Ambulatory Visit (INDEPENDENT_AMBULATORY_CARE_PROVIDER_SITE_OTHER): Payer: Medicaid Other | Admitting: Family Medicine

## 2022-01-24 NOTE — Progress Notes (Unsigned)
Patient Name: Alyssa Howell Date of Birth: 2000-08-28 [facility] Initial Prenatal Visit  Alyssa Howell is a 21 y.o. year old G2P0 at [redacted]w[redacted]d who presents for her initial prenatal visit. Pregnancy {Is/is not:9024} planned She reports {pregnancy symptoms:18128}. She {is/is not:320031::"is"} taking a prenatal vitamin.  She denies pelvic pain or vaginal bleeding.   Pregnancy Dating: The patient is dated by ***.  LMP: *** Period is certain:  {yes/no:20286}.  Periods were regular:  {yes/no:20286}.  LMP was a typical period:  {yes/no:20286}.  Using hormonal contraception in 3 months prior to conception: {yes/no:20286}  Lab Review: Blood type: A Rh Status: + Antibody screen: Negative HIV: Negative RPR: Negative Hemoglobin electrophoresis reviewed: Yes Results of OB urine culture are: Negative Rubella: Immune Hep C Ab: Negative Varicella status is {Desc; immune/not/unknown:31571::"Immune"}  PMH: Reviewed and as detailed below: HTN: {yes/no:20286::"No"}  Gestational Hypertension/preeclampsia: {yes/no:20286::"No"}  Type 1 or 2 Diabetes: {yes/no:20286::"No"}  Depression:  {yes/no:20286::"No"}  Seizure disorder:  {yes/no:20286::"No"} VTE: {yes/no:20286::"No"} ,  History of STI {yes/no:20286::"No"},  Abnormal Pap smear:  {yes/no:20286::"No"}, Genital herpes simplex:  {yes/no:20286::"No"}   PSH: Gynecologic Surgery:  {No/  **:31982:o:"no"} Surgical history reviewed, notable for: ***  Obstetric History: Obstetric history tab updated and reviewed.  Summary of prior pregnancies: *** Cesarean delivery: {yes/no:20286::"No"}  Gestational Diabetes:  {yes/no:20286::"No"} Hypertension in pregnancy: {yes/no:20286::"No"} History of preterm birth: {yes/no:20286::"No"} History of LGA/SGA infant:  {yes/no:20286::"No"} History of shoulder dystocia: {yes/no:20286::"No"} Indications for referral were reviewed, and the patient has no obstetric indications for referral to High  Risk OB Clinic at this time.   Social History: Partner's name: ***  Tobacco use: {yes/no:20286::"No"} Alcohol use:  {yes/no:20286::"No"} Other substance use:  {yes/no:20286::"No"}  Current Medications:  ***  Reviewed and appropriate in pregnancy.   Genetic and Infection Screen: Flow Sheet Updated {yes/no:20286::"Yes"}  Prenatal Exam: Gen: Well nourished, well developed.  No distress.  Vitals noted. HEENT: Normocephalic, atraumatic.  Neck supple without cervical lymphadenopathy, thyromegaly or thyroid nodules.  Fair dentition. CV: RRR no murmur, gallops or rubs Lungs: CTA B.  Normal respiratory effort without wheezes or rales. Abd: soft, NTND. +BS.  Uterus not appreciated above pelvis. GU: Normal external female genitalia without lesions.  Nl vaginal, well rugated without lesions. No vaginal discharge.  Bimanual exam: No adnexal mass or TTP. No CMT.  Uterus size *** Ext: No clubbing, cyanosis or edema. Psych: Normal grooming and dress.  Not depressed or anxious appearing.  Normal thought content and process without flight of ideas or looseness of associations  Fetal heart tones: {appropriate:23337::"Appropriate"}  Assessment/Plan:  Alyssa Howell is a 21 y.o. G2P0 at [redacted]w[redacted]d who presents to initiate prenatal care. She is doing well.  Current pregnancy issues include ***.  Routine prenatal care: As dating {ACTION; IS/IS NOT:21021397::"is not"} reliable, a dating ultrasound {HAS HAS NOT:18834::"has"} been ordered. Dating tab updated. Pre-pregnancy weight updated. Expected weight gain this pregnancy is {weight gain pregnancy :23296::"25-35 pounds "} Prenatal labs reviewed, notable for ***. Indications for referral to [facility] were reviewed and the patient {DOES NOT does:27190::"does not"} meet criteria for referral.  Medication list reviewed and updated.  Recommended patient see a dentist for regular care.  Bleeding and pain precautions reviewed. Importance of prenatal vitamins  reviewed.  Genetic screening offered. Patient opted for: {obgeneticscreen:23414}. The patient has the following indications for aspirinto begin 81 mg at 12-16 weeks: One high risk condition: {fmcaspirinobhigh:26167} MORE than one moderate risk condition: {fmcaspirinobmoderate:26168} Aspirin {WAS/WAS NOT:[phone]::"was not"}  recommended today based upon above risk factors (one high risk condition or  more than one moderate risk factor)  The patient {will/will not be:23415} age 38 or over at time of delivery. Referral to genetic counseling {WAS/WAS NOT:[phone]::"was not"} offered today.  The patient has the following risk factors for preexisting diabetes: {Pre-existing diabetes screening:23343::"Reviewed indications for early 1 hour glucose testing, not indicated "}. An early 1 hour glucose tolerance test {WAS/WAS NOT:[phone]::"was not"} ordered. Pregnancy Medical Home and PHQ-9 forms completed, problems noted: {yes/no:20286}  2. Pregnancy issues include the following which were addressed today:  ***   Follow up 4 weeks for next prenatal visit.

## 2022-02-03 ENCOUNTER — Encounter: Payer: Medicaid Other | Admitting: Family Medicine

## 2022-02-18 ENCOUNTER — Encounter (HOSPITAL_COMMUNITY): Payer: Self-pay | Admitting: Obstetrics and Gynecology

## 2022-02-18 ENCOUNTER — Inpatient Hospital Stay (HOSPITAL_COMMUNITY)
Admission: AD | Admit: 2022-02-18 | Discharge: 2022-02-18 | Disposition: A | Discharge status: Home / Self Care | Payer: Medicaid Other | Attending: Obstetrics and Gynecology | Admitting: Obstetrics and Gynecology

## 2022-02-18 DIAGNOSIS — Z3A12 12 weeks gestation of pregnancy: Secondary | ICD-10-CM | POA: Diagnosis not present

## 2022-02-18 DIAGNOSIS — B9689 Other specified bacterial agents as the cause of diseases classified elsewhere: Secondary | ICD-10-CM | POA: Insufficient documentation

## 2022-02-18 DIAGNOSIS — O99321 Drug use complicating pregnancy, first trimester: Secondary | ICD-10-CM | POA: Diagnosis not present

## 2022-02-18 DIAGNOSIS — O219 Vomiting of pregnancy, unspecified: Secondary | ICD-10-CM | POA: Insufficient documentation

## 2022-02-18 DIAGNOSIS — O26891 Other specified pregnancy related conditions, first trimester: Secondary | ICD-10-CM | POA: Insufficient documentation

## 2022-02-18 DIAGNOSIS — O23591 Infection of other part of genital tract in pregnancy, first trimester: Secondary | ICD-10-CM | POA: Diagnosis not present

## 2022-02-18 LAB — URINALYSIS, ROUTINE W REFLEX MICROSCOPIC
Bilirubin Urine: NEGATIVE
Glucose, UA: NEGATIVE mg/dL
Ketones, ur: 80 mg/dL — AB
Nitrite: NEGATIVE
Protein, ur: 30 mg/dL — AB
Specific Gravity, Urine: 1.027 (ref 1.005–1.030)
WBC, UA: >50 WBC/hpf — ABNORMAL HIGH (ref 0–5)
pH: 5 (ref 5.0–8.0)

## 2022-02-18 LAB — WET PREP, GENITAL
Sperm: NONE SEEN
Trich, Wet Prep: NONE SEEN
WBC, Wet Prep HPF POC: >=10 — AB (ref <10)
Yeast Wet Prep HPF POC: NONE SEEN

## 2022-02-18 MED ORDER — LACTATED RINGERS IV BOLUS
1000.0000 mL | Freq: Once | INTRAVENOUS | Status: AC
  Administered 2022-02-18: 1000 mL via INTRAVENOUS

## 2022-02-18 MED ORDER — ONDANSETRON HCL 4 MG/2ML IJ SOLN
4.0000 mg | Freq: Once | INTRAMUSCULAR | Status: AC
  Administered 2022-02-18: 4 mg via INTRAVENOUS
  Filled 2022-02-18: qty 2

## 2022-02-18 MED ORDER — METRONIDAZOLE 500 MG PO TABS: 500.0000 mg | ORAL_TABLET | Freq: Two times a day (BID) | ORAL | 0 refills | Status: DC

## 2022-02-18 MED ORDER — METOCLOPRAMIDE HCL 10 MG PO TABS: 10.0000 mg | ORAL_TABLET | Freq: Four times a day (QID) | ORAL | 2 refills | Status: DC

## 2022-02-18 MED ORDER — SCOPOLAMINE 1 MG/3DAYS TD PT72
1.0000 | MEDICATED_PATCH | TRANSDERMAL | Status: DC
Start: 2022-02-18 — End: 2022-02-18
  Administered 2022-02-18: 1.5 mg via TRANSDERMAL
  Filled 2022-02-18: qty 1

## 2022-02-18 MED ORDER — PROMETHAZINE HCL 25 MG PO TABS: 25.0000 mg | ORAL_TABLET | Freq: Four times a day (QID) | ORAL | 2 refills | Status: DC | PRN

## 2022-02-18 MED ORDER — SCOPOLAMINE 1 MG/3DAYS TD PT72: 1.0000 | MEDICATED_PATCH | TRANSDERMAL | 12 refills | Status: DC

## 2022-02-18 MED ORDER — FLUCONAZOLE 150 MG PO TABS: 150.0000 mg | ORAL_TABLET | Freq: Every day | ORAL | 0 refills | Status: DC

## 2022-02-18 NOTE — Discharge Instructions (Signed)
Prenatal Care Providers           [facility]  [address] [phone]  [facility]   [address]  [phone]  [facility] [place]       [address] [phone]            [facility] @ [place]     [address] [phone]          [facility] [place]   [address] [phone]  [facility]  [address] [phone]     [facility])  [address]   [phone]     [facility]  Phone: [phone]  [facility]  Phone: [phone]  [facility] Phone: [phone]  [facility] Phone: [phone]  [facility] Phone: [phone]  [facility] Phone: [phone]  [facility]  Phone: [phone] Safe Medications in Pregnancy   Acne: Benzoyl Peroxide Salicylic Acid  Backache/Headache: Tylenol: 2 regular strength every 4 hours OR              2 Extra strength every 6 hours  Colds/Coughs/Allergies: Benadryl (alcohol free) 25 mg every 6 hours as needed Breath right strips Claritin Cepacol throat lozenges Chloraseptic throat spray Cold-Eeze- up to three times per day Cough drops, alcohol free Flonase (by prescription only) Guaifenesin Mucinex Robitussin DM (plain only, alcohol free) Saline nasal spray/drops Sudafed (pseudoephedrine) & Actifed ** use only after [redacted] weeks gestation and if you do not have high blood pressure Tylenol Vicks Vaporub Zinc lozenges Zyrtec   Constipation: Colace Ducolax suppositories Fleet enema Glycerin suppositories Metamucil Milk of magnesia Miralax Senokot Smooth move tea  Diarrhea: Kaopectate Imodium A-D  *NO pepto  Bismol  Hemorrhoids: Anusol Anusol HC Preparation H Tucks  Indigestion: Tums Maalox Mylanta Zantac  Pepcid  Insomnia: Benadryl (alcohol free) 25mg every 6 hours as needed Tylenol PM Unisom, no Gelcaps  Leg Cramps: Tums MagGel  Nausea/Vomiting:  Bonine Dramamine Emetrol Ginger extract Sea bands Meclizine  Nausea medication to take during pregnancy:  Unisom (doxylamine succinate 25 mg tablets) Take one tablet daily at bedtime. If symptoms are not adequately controlled, the dose can be increased to a maximum recommended dose of two tablets daily (1/2 tablet in the morning, 1/2 tablet mid-afternoon and one at bedtime). Vitamin B6 100mg tablets. Take one tablet twice a day (up to 200 mg per day).  Skin Rashes: Aveeno products Benadryl cream or 25mg every 6 hours as needed Calamine Lotion 1% cortisone cream  Yeast infection: Gyne-lotrimin 7 Monistat 7   **If taking multiple medications, please check labels to avoid duplicating the same active ingredients **take medication as directed on the label ** Do not exceed 4000 mg of tylenol in 24 hours **Do not take medications that contain aspirin or ibuprofen    

## 2022-02-18 NOTE — MAU Provider Note (Signed)
History     CSN: [id number]  Arrival date and time: 02/18/22 1111   Event Date/Time   First Provider Initiated Contact with Patient 02/18/22 1153      Chief Complaint  Patient presents with   Back Pain   Dysuria   Emesis   HPI  Alyssa Howell is a 21 y.o. G2P1001 at [redacted]w[redacted]d who presents for evaluation of nausea and vomiting. Patient reports she has been vomiting many times a day for several days. She does not have any nausea medicine and is unable to keep anything down. She also reports dysuria and is concerned she may have an STD. She reports intermittent pain in the middle of her back. Patient rates the pain as a 2/10 when it happens.  She denies any vaginal bleeding, discharge, and leaking of fluid. Denies any constipation, diarrhea.  OB History     Gravida  2   Para  1   Term  1   Preterm      AB      Living  1      SAB      IAB      Ectopic      Multiple      Live Births  1           Past Medical History:  Diagnosis Date   ADHD    Depression    Kidney stones    Urinary reflux    Vesico-ureteral reflux     Past Surgical History:  Procedure Laterality Date   CYSTOSCOPY/URETEROSCOPY/HOLMIUM LASER/STENT PLACEMENT Right 08/02/2020   Procedure: CYSTOSCOPY/URETEROSCOPY/HOLMIUM LASER/STENT PLACEMENT;  Surgeon: Vanna Scotland, MD;  Location: [facility] ORS;  Service: Urology;  Laterality: Right;   kidney stones     TONSILLECTOMY      Family History  Problem Relation Age of Onset   Hepatitis C Mother    Valvular heart disease Mother    Healthy Father    Breast cancer Maternal Grandmother    Diabetes Other    Heart attack Other     Social History   Tobacco Use   Smoking status: Never   Smokeless tobacco: Never  Vaping Use   Vaping Use: Never used  Substance Use Topics   Alcohol use: Not Currently   Drug use: Not Currently    Types: Marijuana    Comment: last used the end of September 2022 as of 02/07/2021    Allergies:  Allergies   Allergen Reactions   Abilify [Aripiprazole] Other (See Comments)    Seizures    No medications prior to admission.    Review of Systems  Constitutional: Negative.  Negative for fatigue and fever.  HENT: Negative.    Respiratory: Negative.  Negative for shortness of breath.   Cardiovascular: Negative.  Negative for chest pain.  Gastrointestinal:  Positive for nausea and vomiting. Negative for abdominal pain, constipation and diarrhea.  Genitourinary: Negative.  Negative for dysuria, vaginal bleeding and vaginal discharge.  Musculoskeletal:  Positive for back pain.  Neurological: Negative.  Negative for dizziness and headaches.   Physical Exam   Blood pressure (!) 111/57, pulse 84, temperature 98.2 F (36.8 C), temperature source Oral, resp. rate 18, height 5\' 7"  (1.702 m), weight 90.9 kg, last menstrual period 10/22/2021, SpO2 98 %.  Patient Vitals for the past 24 hrs:  BP Temp Temp src Pulse Resp SpO2 Height Weight  02/18/22 1433 (!) 111/57 -- -- 84 18 -- -- --  02/18/22 1128 129/72 98.2 F (36.8 C) Oral 97  16 98 % 5\' 7"  (1.702 m) 90.9 kg    Physical Exam Vitals and nursing note reviewed.  Constitutional:      General: She is not in acute distress.    Appearance: She is well-developed.  HENT:     Head: Normocephalic.  Eyes:     Pupils: Pupils are equal, round, and reactive to light.  Cardiovascular:     Rate and Rhythm: Normal rate and regular rhythm.     Heart sounds: Normal heart sounds.  Pulmonary:     Effort: Pulmonary effort is normal. No respiratory distress.     Breath sounds: Normal breath sounds.  Abdominal:     General: Bowel sounds are normal. There is no distension.     Palpations: Abdomen is soft.     Tenderness: There is no abdominal tenderness.  Musculoskeletal:       Arms:  Skin:    General: Skin is warm and dry.  Neurological:     Mental Status: She is alert and oriented to person, place, and time.  Psychiatric:        Mood and Affect: Mood  normal.        Behavior: Behavior normal.        Thought Content: Thought content normal.        Judgment: Judgment normal.     FHT: 176 bpm  MAU Course  Procedures  Results for orders placed or performed during the hospital encounter of 02/18/22 (from the past 24 hour(s))  Urinalysis, Routine w reflex microscopic Urine, Clean Catch     Status: Abnormal   Collection Time: 02/18/22 11:37 AM  Result Value Ref Range   Color, Urine AMBER (A) YELLOW   APPearance CLOUDY (A) CLEAR   Specific Gravity, Urine 1.027 1.005 - 1.030   pH 5.0 5.0 - 8.0   Glucose, UA NEGATIVE NEGATIVE mg/dL   Hgb urine dipstick SMALL (A) NEGATIVE   Bilirubin Urine NEGATIVE NEGATIVE   Ketones, ur 80 (A) NEGATIVE mg/dL   Protein, ur 30 (A) NEGATIVE mg/dL   Nitrite NEGATIVE NEGATIVE   Leukocytes,Ua MODERATE (A) NEGATIVE   RBC / HPF 6-10 0 - 5 RBC/hpf   WBC, UA >50 (H) 0 - 5 WBC/hpf   Bacteria, UA RARE (A) NONE SEEN   Squamous Epithelial / LPF 21-50 0 - 5   Mucus PRESENT   Wet prep, genital     Status: Abnormal   Collection Time: 02/18/22 12:28 PM   Specimen: PATH Cytology Cervicovaginal Ancillary Only  Result Value Ref Range   Yeast Wet Prep HPF POC NONE SEEN NONE SEEN   Trich, Wet Prep NONE SEEN NONE SEEN   Clue Cells Wet Prep HPF POC PRESENT (A) NONE SEEN   WBC, Wet Prep HPF POC >=10 (A) <10   Sperm NONE SEEN        MDM Labs ordered and reviewed.   UA LR Bolus x2 Zofran  Scop patch  Wet prep and gc/chlamydia   Assessment and Plan   1. Nausea and vomiting during pregnancy   2. [redacted] weeks gestation of pregnancy   3. Bacterial vaginosis     -Discharge home in stable condition -Rx for scop patches, reglan, phenergan and metronidazole sent to pharmacy. Patient requested diflcuan for after completing antibiotics. Discussed recommendations in pregnancy and patient verbalized understanding and desires diflucan. -Nausea and vomiting precautions discussed -Patient advised to follow-up with OB as  scheduled for prenatal care -Patient may return to MAU as needed or if her condition  were to change or worsen  Rolm Bookbinder, [place] 02/18/2022, 11:53 AM

## 2022-02-18 NOTE — MAU Note (Addendum)
Alyssa Howell is a 21 y.o. at [redacted]w[redacted]d here in MAU reporting: having pain in lower back, center around spine.  Also having pain with urination. Has been vomiting since yesterday, "even waking up to puke". No bleeding or discharge.  Has been both constipated for a wk, but when she goes it is like diarrhea.   Onset of complaint: 2 days ago Pain score: 8/7 Vitals:   02/18/22 1128  BP: 129/72  Pulse: 97  Resp: 16  Temp: 98.2 F (36.8 C)  SpO2: 98%     FHT:176 Lab orders placed from triage:  UA

## 2022-02-19 LAB — CULTURE, OB URINE: Culture: >=100000 — AB

## 2022-02-20 LAB — GC/CHLAMYDIA PROBE AMP (~~LOC~~) NOT AT ARMC
Chlamydia: NEGATIVE
Comment: NEGATIVE
Comment: NORMAL
Neisseria Gonorrhea: POSITIVE — AB

## 2022-02-22 ENCOUNTER — Inpatient Hospital Stay (HOSPITAL_COMMUNITY)
Admission: AD | Admit: 2022-02-22 | Discharge: 2022-02-22 | Disposition: A | Discharge status: Home / Self Care | Payer: Medicaid Other | Attending: Obstetrics and Gynecology | Admitting: Obstetrics and Gynecology

## 2022-02-22 DIAGNOSIS — O98219 Gonorrhea complicating pregnancy, unspecified trimester: Secondary | ICD-10-CM | POA: Insufficient documentation

## 2022-02-22 DIAGNOSIS — Z3A12 12 weeks gestation of pregnancy: Secondary | ICD-10-CM | POA: Diagnosis not present

## 2022-02-22 DIAGNOSIS — O98211 Gonorrhea complicating pregnancy, first trimester: Secondary | ICD-10-CM | POA: Diagnosis not present

## 2022-02-22 MED ORDER — CEFTRIAXONE SODIUM 500 MG IJ SOLR
500.0000 mg | Freq: Once | INTRAMUSCULAR | Status: AC
  Administered 2022-02-22: 500 mg via INTRAMUSCULAR
  Filled 2022-02-22: qty 500

## 2022-02-22 MED ORDER — LIDOCAINE HCL (PF) 1 % IJ SOLN
INTRAMUSCULAR | Status: AC
  Administered 2022-02-22: 1 mL via INTRAMUSCULAR
  Filled 2022-02-22: qty 5

## 2022-02-22 NOTE — MAU Provider Note (Addendum)
[name] is a 21 y.o. G86P1001 female who presents to MAU today with complaint of needing treatment for Gonorrhea. She was notified of a positive result yesterday.   ROS: +vaginal irritation  O BP 119/62 (BP Location: Right Arm)   Pulse 94   Temp 98.3 F (36.8 C) (Oral)   Resp 18   Ht 5\' 7"  (1.702 m)   Wt 95.3 kg   LMP 10/22/2021 (Exact Date)   SpO2 100%   BMI 32.89 kg/m  Physical Exam Constitutional:      General: She is not in acute distress.    Appearance: Normal appearance.  HENT:     Head: Normocephalic and atraumatic.  Pulmonary:     Effort: Pulmonary effort is normal. No respiratory distress.  Musculoskeletal:        General: Normal range of motion.     Cervical back: Normal range of motion.  Neurological:     General: No focal deficit present.     Mental Status: She is alert and oriented to person, place, and time.  Psychiatric:        Mood and Affect: Mood normal.        Behavior: Behavior normal.     MDM: Chart reviewed. +GC on 02/18/22. Rocephin ordered and given. Partner is getting treated in ED today. Instructed to refrain from IC for 2-3 weeks and will need TOC. Stable for discharge home.   A 1. [redacted] weeks gestation of pregnancy   2. Gonorrhea affecting pregnancy in first trimester     P Discharge from MAU in stable condition Warning signs for worsening condition that would warrant emergency follow-up discussed Patient may return to MAU as needed for pregnancy related complaints  Julianne Handler, CNM 02/22/2022 3:02 PM

## 2022-02-22 NOTE — MAU Note (Signed)
Alyssa Howell is a 21 y.o. at [redacted]w[redacted]d here in MAU reporting: here for treatment, was told she could come here for her shot.  Is really irritated 'down there', couldn't wait for Friday Appt. Watery yellow d/c, burns when she pees  Onset of complaint: ongoing  Pain score: 6 Vitals:   02/22/22 1445  BP: 119/62  Pulse: 94  Resp: 18  Temp: 98.3 F (36.8 C)  SpO2: 100%     FHT:168 Lab orders placed from triage:  none

## 2022-02-28 ENCOUNTER — Ambulatory Visit (INDEPENDENT_AMBULATORY_CARE_PROVIDER_SITE_OTHER): Payer: Medicaid Other | Admitting: Family Medicine

## 2022-02-28 ENCOUNTER — Other Ambulatory Visit: Payer: Self-pay

## 2022-02-28 VITALS — BP 120/75 | HR 110 | Wt 206.2 lb

## 2022-02-28 DIAGNOSIS — Z3A17 17 weeks gestation of pregnancy: Secondary | ICD-10-CM | POA: Diagnosis not present

## 2022-02-28 DIAGNOSIS — Z23 Encounter for immunization: Secondary | ICD-10-CM

## 2022-02-28 DIAGNOSIS — Z3481 Encounter for supervision of other normal pregnancy, first trimester: Secondary | ICD-10-CM | POA: Diagnosis not present

## 2022-02-28 DIAGNOSIS — Z3482 Encounter for supervision of other normal pregnancy, second trimester: Secondary | ICD-10-CM | POA: Diagnosis not present

## 2022-02-28 DIAGNOSIS — Z3480 Encounter for supervision of other normal pregnancy, unspecified trimester: Secondary | ICD-10-CM

## 2022-02-28 MED ORDER — ASPIRIN 81 MG PO TBEC: 81.0000 mg | DELAYED_RELEASE_TABLET | Freq: Every day | ORAL | 12 refills | Status: DC

## 2022-02-28 NOTE — Patient Instructions (Addendum)
It was great seeing you today!  You came in for your initial OB visit and I am glad you are feeling well! I have sent in for your nausea medication. I have also prescribed aspirin to help prevent pre eclampsia. Continue taking your prenatal vitamins   You got your flu shot and we are doing the genetic screening today as well   We will do STI testing at your next appointment   Please check-out at the front desk before leaving the clinic. We will see you back in 2 weeks for your next follow up but if you need to be seen earlier than that for any new issues we're happy to fit you in, just give [place] a call!  Visit Reminders: - Stop by the pharmacy to pick up your prescriptions  - Continue to work on your healthy eating habits and incorporating exercise into your daily life.   Feel free to call with any questions or concerns at any time, at [phone].   Take care,  Dr. Shary Key [facility]

## 2022-02-28 NOTE — Progress Notes (Signed)
Patient Name: Alyssa Howell Date of Birth: Aug 06, 2000 [place] Initial Prenatal Visit  Alyssa Howell is a 21 y.o. year old G2P1001 at [redacted]w[redacted]d who presents for her initial prenatal visit. Pregnancy is not planned She reports nausea but feels it is not bad and much better than her first pregnancy. Requests refill of phenergan  She is taking a prenatal vitamin.  She denies pelvic pain or vaginal bleeding.   Pregnancy Dating: The patient is dated by early [place] on 9/7 showing 5 weeks 0 days LMP: 07/27/99 Period is certain:  Yes.  Periods were regular:  No Occur every month to 1.5 months  LMP was a typical period:  Yes.  Using hormonal contraception in 3 months prior to conception: No  Lab Review: Blood type: A Rh Status: + Antibody screen: Negative HIV: Negative RPR: Negative Hemoglobin electrophoresis reviewed: Yes Results of OB urine culture are: Negative- mixed culture  Rubella: Immune Hep C Ab: Negative Varicella status is Unknown  PMH: Reviewed and as detailed below: HTN: No  Gestational Hypertension/preeclampsia: No  Type 1 or 2 Diabetes: No  Depression:  No  Seizure disorder:  No VTE: No ,  History of STI Yes,  Abnormal Pap smear:  Has not had one  Genital herpes simplex:  No   PSH: Gynecologic Surgery:  no Surgical history reviewed, notable for: Tonsillectomy in the past   Obstetric History: Obstetric history tab updated and reviewed.  Summary of prior pregnancies: Vaginal delivery of baby boy on 04/23/21 at [redacted]w[redacted]d. 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   Social History: Partner's name: Charolett Bumpers    Tobacco use: No Alcohol use:  No Other substance use:  No Recently stopped smoking marijuana about 3 weeks ago  Current Medications:  Just prenatal gummy vitamins   Reviewed and appropriate in pregnancy.   Genetic and Infection Screen: Flow Sheet Updated Yes  Prenatal Exam: Gen: Well nourished, well developed.  No distress.  Vitals noted. HEENT: Normocephalic, atraumatic.  Neck supple without cervical lymphadenopathy, thyromegaly or thyroid nodules.  Fair dentition. CV: RRR no murmur, gallops or rubs Lungs: CTA B.  Normal respiratory effort without wheezes or rales. Abd: soft, NTND. +BS.  Uterus not appreciated above pelvis. Ext: No clubbing, cyanosis or edema. Psych: Normal grooming and dress.  Not depressed or anxious appearing.  Normal thought content and process without flight of ideas or looseness of associations   Assessment/Plan:  Alyssa Howell is a 21 y.o. G2P1001 at [redacted]w[redacted]d who presents to initiate prenatal care. She is doing well.  Current pregnancy issues include mild nausea.  Routine prenatal care: As dating is not reliable, a dating ultrasound has been ordered. Dating tab updated. Pre-pregnancy weight updated. Expected weight gain this pregnancy is 11-20 pounds  Prenatal labs reviewed Indications for referral to [facility] were reviewed and the patient does not meet criteria for referral.  Medication list reviewed and updated.  Recommended patient see a dentist for regular care.  Bleeding and pain precautions reviewed. Importance of prenatal vitamins reviewed.  Genetic screening offered. Patient opted for: first trimester screen with nuchal translucency at 11-13 weeks  The patient has the following indications for aspirinto begin 68  mg at 12-16 weeks: One high risk condition: no single high risk condition  MORE than one moderate risk condition: obesity Aspirin was  recommended today based upon above risk factors (one high risk condition or more than one moderate risk  factor)  The patient will not be age 61 or over at time of delivery. Referral to genetic counseling was not offered today.  The patient has the following risk factors for preexisting diabetes: Reviewed indications for early 1 hour glucose testing, not indicated . An early 1 hour glucose tolerance test was not ordered.  2. Pregnancy issues include the following which were addressed today:  Gonorrhea during pregnancy- received Rocephin on 11/1 and partner treated as well. Needs test of cure. Will check STIs at follow up appointment in 2 weeks.  Nausea- lost medication, partner accidentally threw it in the trash. would like another refill of phenergan. States Diglegis doesn't work for her and the scopolamine patches aren't covered by insurance.  ASA prescribed for preE ppx  Flu vaccine administered  Dating [place] scheduled for 11/13   Follow up 2 weeks for next prenatal visit.

## 2022-03-05 LAB — PANORAMA PRENATAL TEST FULL PANEL:PANORAMA TEST PLUS 5 ADDITIONAL MICRODELETIONS: FETAL FRACTION: 16.6

## 2022-03-06 ENCOUNTER — Ambulatory Visit
Admission: RE | Admit: 2022-03-06 | Discharge: 2022-03-06 | Disposition: A | Discharge status: Home / Self Care | Payer: Medicaid Other | Source: Ambulatory Visit | Attending: Family Medicine | Admitting: Family Medicine

## 2022-03-06 ENCOUNTER — Other Ambulatory Visit: Payer: Self-pay | Admitting: Family Medicine

## 2022-03-06 DIAGNOSIS — Z3490 Encounter for supervision of normal pregnancy, unspecified, unspecified trimester: Secondary | ICD-10-CM

## 2022-03-06 DIAGNOSIS — Z3492 Encounter for supervision of normal pregnancy, unspecified, second trimester: Secondary | ICD-10-CM | POA: Insufficient documentation

## 2022-03-06 DIAGNOSIS — Z3A13 13 weeks gestation of pregnancy: Secondary | ICD-10-CM | POA: Diagnosis not present

## 2022-03-06 DIAGNOSIS — Z3689 Encounter for other specified antenatal screening: Secondary | ICD-10-CM | POA: Diagnosis not present

## 2022-03-09 DIAGNOSIS — Z3009 Encounter for other general counseling and advice on contraception: Secondary | ICD-10-CM | POA: Diagnosis not present

## 2022-03-09 DIAGNOSIS — Z0389 Encounter for observation for other suspected diseases and conditions ruled out: Secondary | ICD-10-CM | POA: Diagnosis not present

## 2022-03-09 DIAGNOSIS — Z1388 Encounter for screening for disorder due to exposure to contaminants: Secondary | ICD-10-CM | POA: Diagnosis not present

## 2022-04-09 IMAGING — CT CT ANGIO CHEST
2 of 7 series · 18 of 46 positions shown · IV contrast (APPLIED)
Comparison: Chest radiograph performed earlier today 04/28/2021.

CLINICAL DATA: Provided history: Pulmonary embolism (PE) suspected,
positive D-dimer. Additional history provided: Patient five days
postpartum, chest pain, abdominal cramping, headache.

EXAM:
CT ANGIOGRAPHY CHEST WITH CONTRAST
TECHNIQUE: Multidetector CT imaging of the chest was performed using the
standard protocol during bolus administration of intravenous
contrast. Multiplanar CT image reconstructions and MIPs were
obtained to evaluate the vascular anatomy.
CONTRAST:  75mL OMNIPAQUE IOHEXOL 350 MG/ML SOLN

[Series 5: thins · axial · 0.61mm/px · z∈[-273,-42]mm · 15 of 322 slices shown]
[im 17/322  lung]
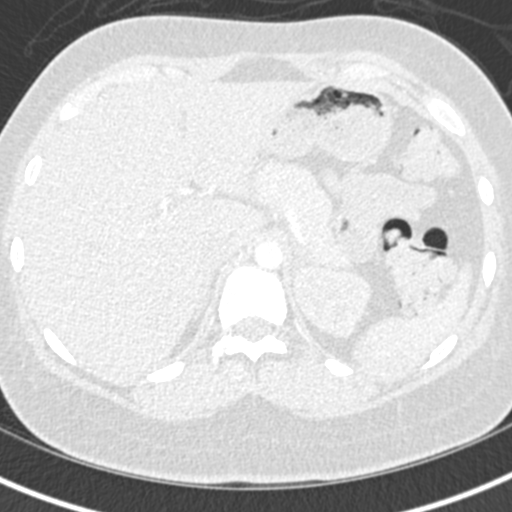
[im 33/322  soft-tissue]
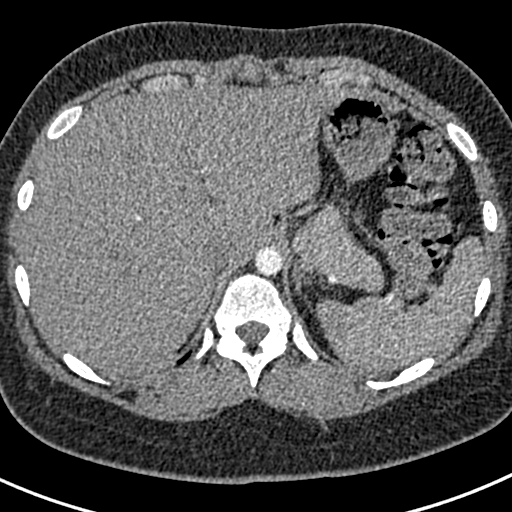
[im 65/322  lung]
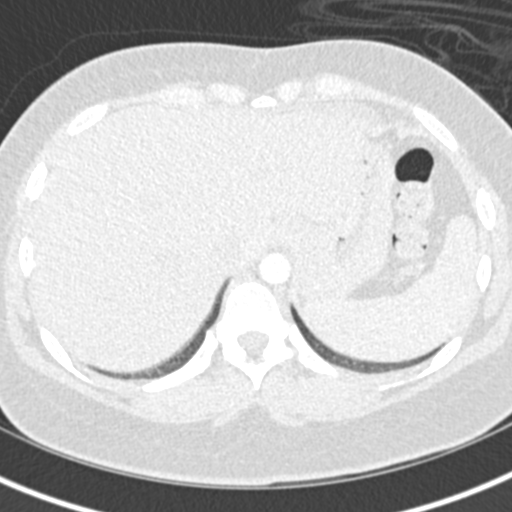
[im 81/322  soft-tissue]
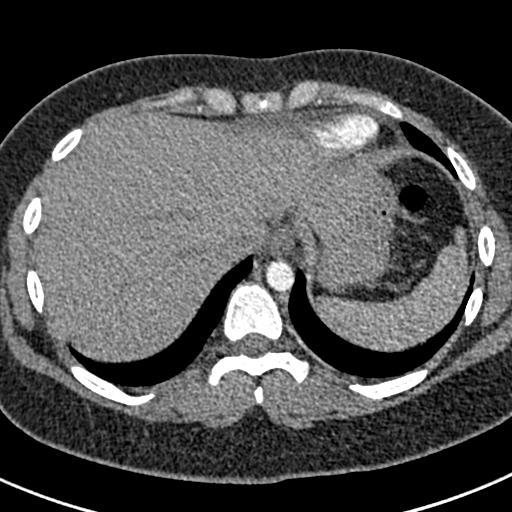
[im 97/322  lung]
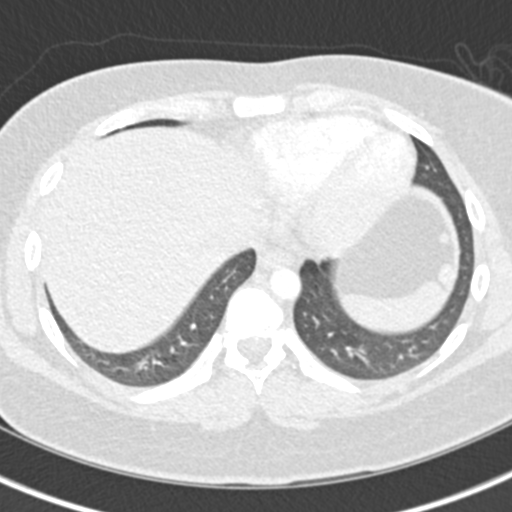
[im 113/322  soft-tissue]
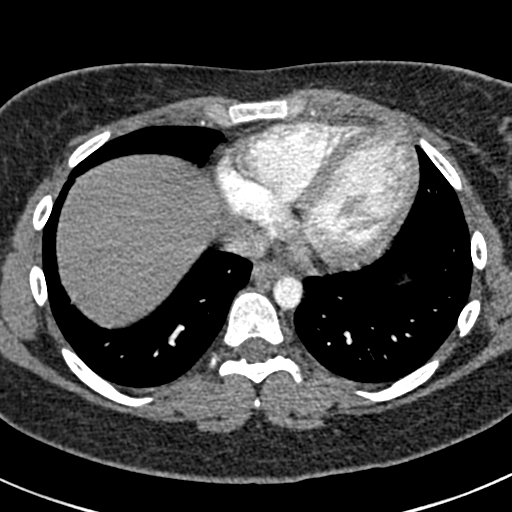
[im 145/322  lung]
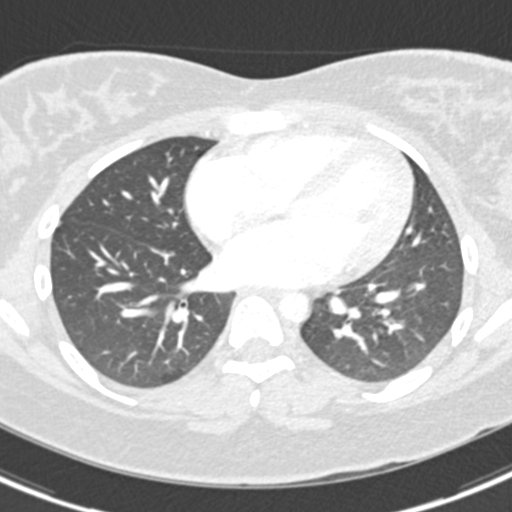
[im 161/322  soft-tissue]
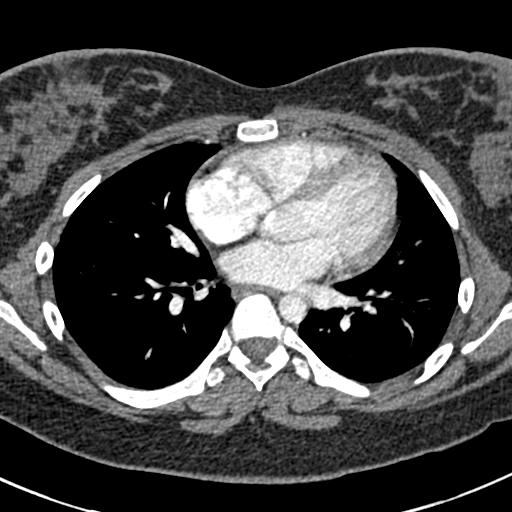
[im 177/322  lung]
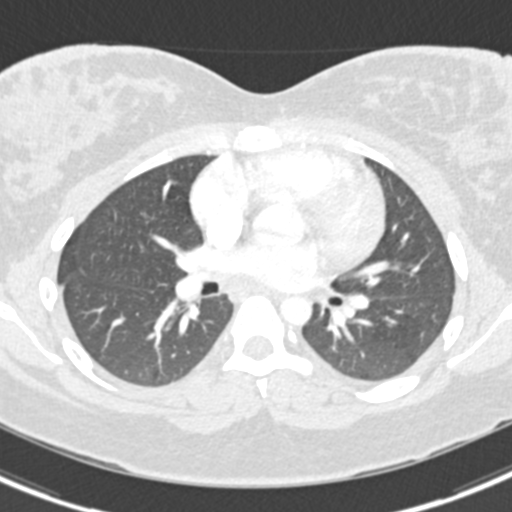
[im 209/322  soft-tissue]
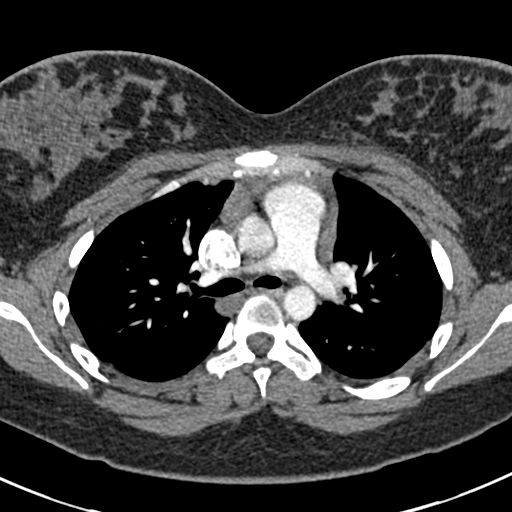
[im 225/322  lung]
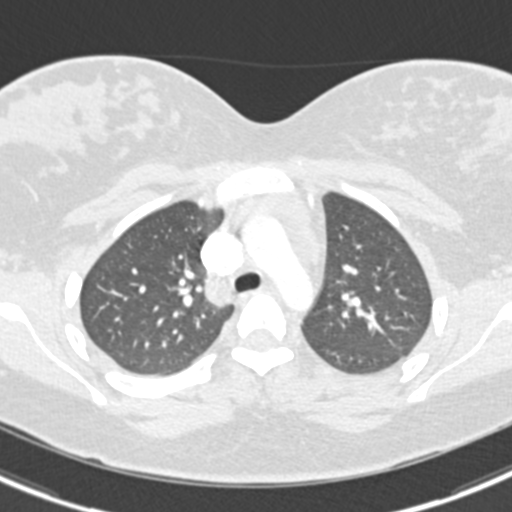
[im 241/322  soft-tissue]
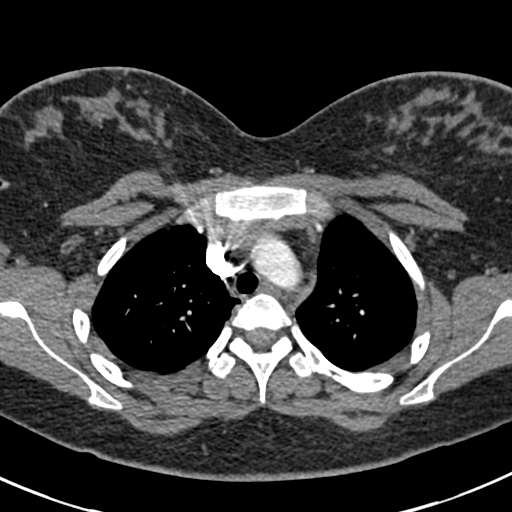
[im 257/322  lung]
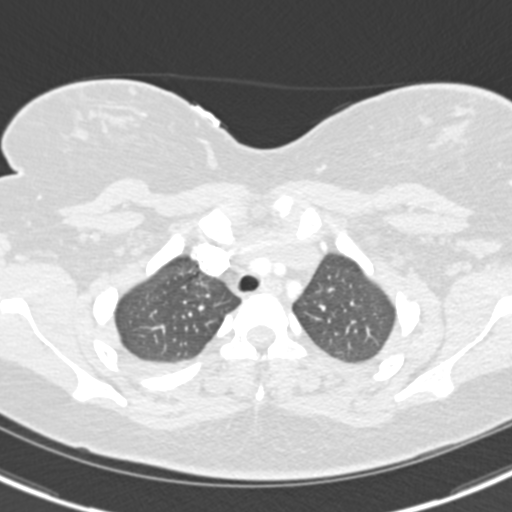
[im 289/322  soft-tissue]
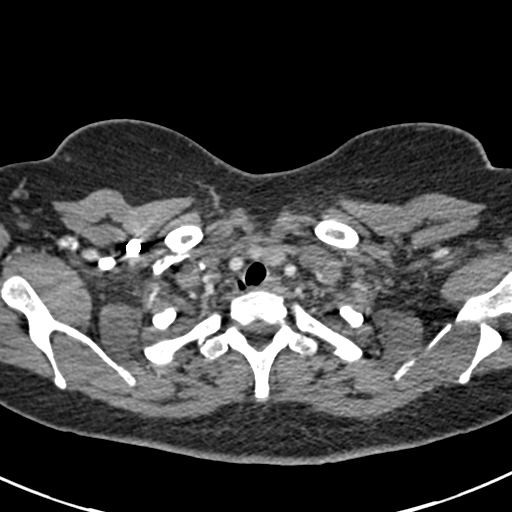
[im 305/322  lung]
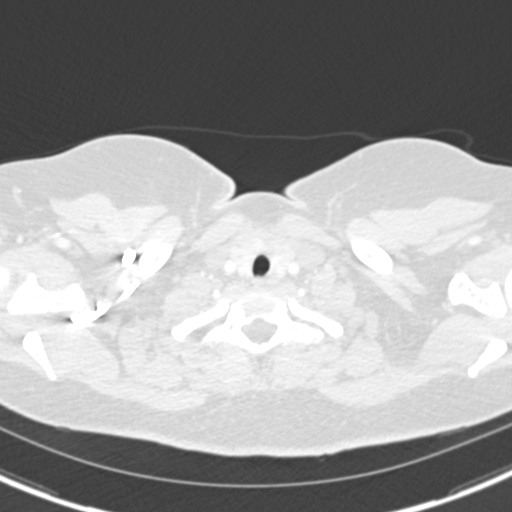

[Series 7: coronal mpr · coronal · 0.52mm/px · 3 of 90 slices shown]
[im 23/90  soft-tissue]
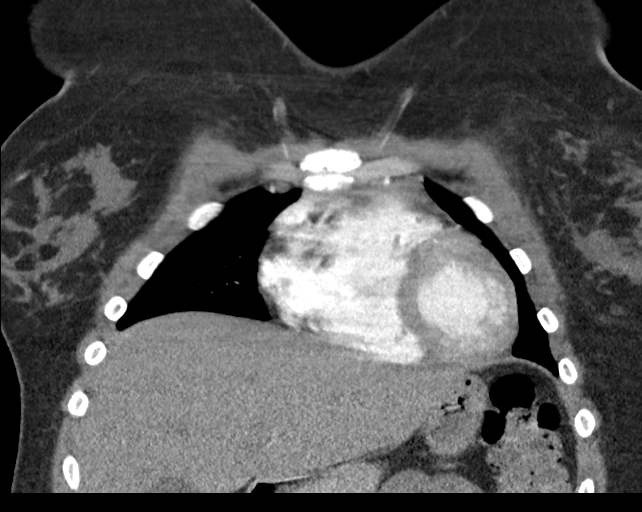
[im 45/90  soft-tissue]
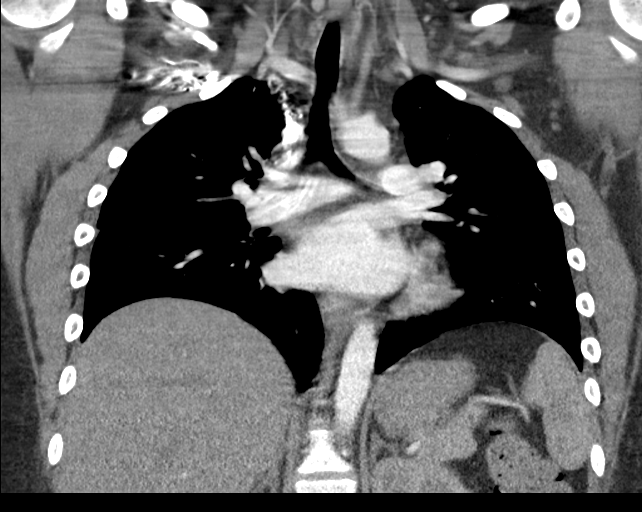
[im 67/90  soft-tissue]
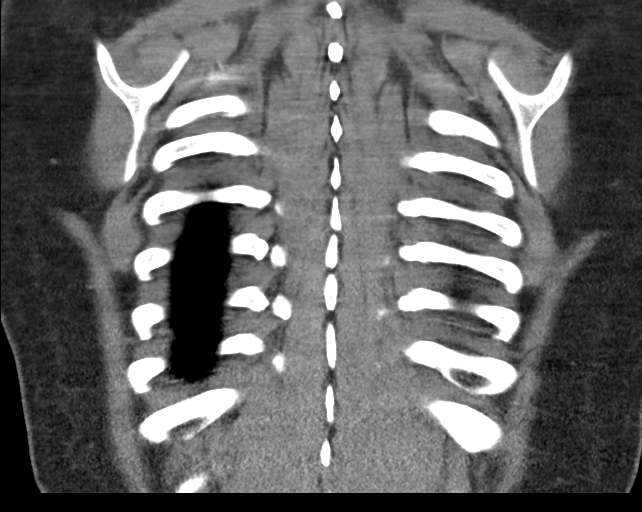

[18 of 46 positions shown; findings below may reference images not displayed]

FINDINGS: CARDIOVASCULAR:

Heart size within normal limits.No pericardial effusion.

No significant vascular finding.

MEDIASTINUM/NODES:

No mediastinal, hilar or axillary lymphadenopathy.

Visualized thyroid gland unremarkable.

LUNGS/PLEURA:

No airspace consolidation.

No pleural effusion or pneumothorax.

Central airways grossly patent. 6 mm focus of gas along the right
posterolateral aspect of the trachea, just above the level of the
thoracic inlet, likely reflecting a small tracheal diverticulum
(series 5, image 34).

UPPER ABDOMEN: No acute finding within the imaged upper abdomen.

MUSCULOSKELETAL:

No acute fracture or aggressive osseous lesion.

The chest wall is unremarkable.

Review of the MIP images confirms the above findings.
IMPRESSION: No evidence of pulmonary embolism.

6 mm probable tracheal diverticulum just above the level of the
thoracic inlet, as described.

Otherwise unremarkable examination.

## 2022-04-09 IMAGING — US US PELVIS COMPLETE
1 series · 14 of 25 positions shown · non-contrast
Comparison: None.

CLINICAL DATA: Postpartum bleeding, vaginal delivery 5 days ago,
excessive bleeding, passing clots, abdominal pain

EXAM:
TRANSABDOMINAL ULTRASOUND OF PELVIS
TECHNIQUE: Transabdominal ultrasound examination of the pelvis was performed
including evaluation of the uterus, ovaries, adnexal regions, and
pelvic cul-de-sac.

[Series 1: us pelvis (transabdominal only) · 14 of 52 slices shown]
[im 1/52]
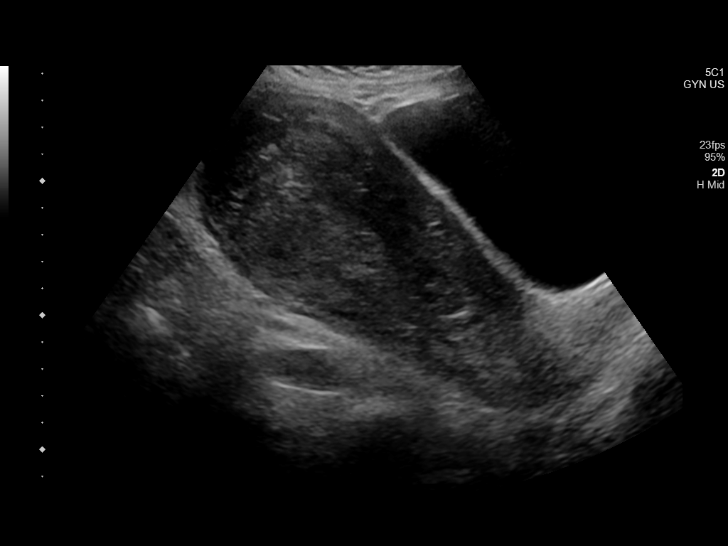
[im 5/52]
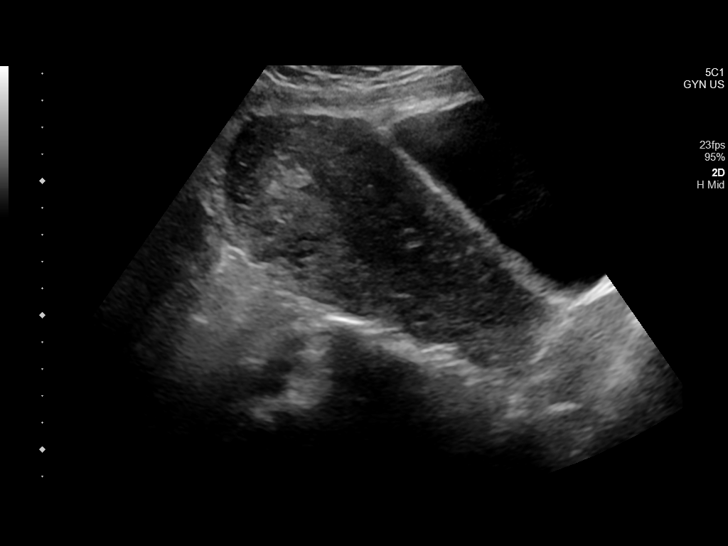
[im 9/52]
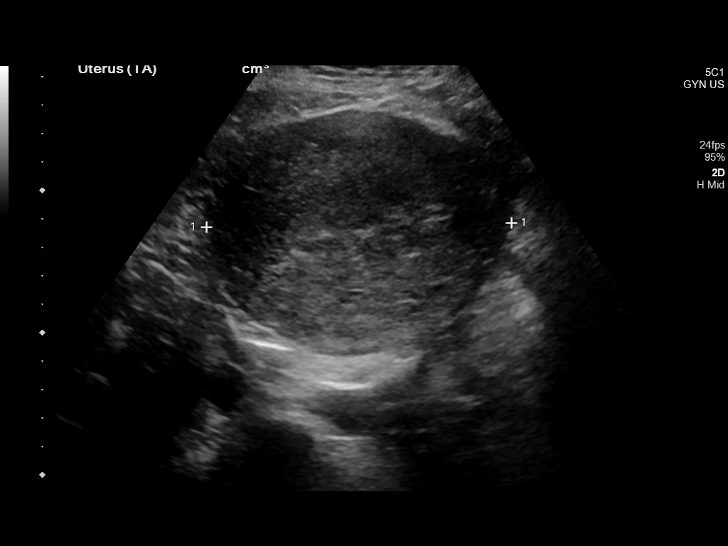
[im 13/52]
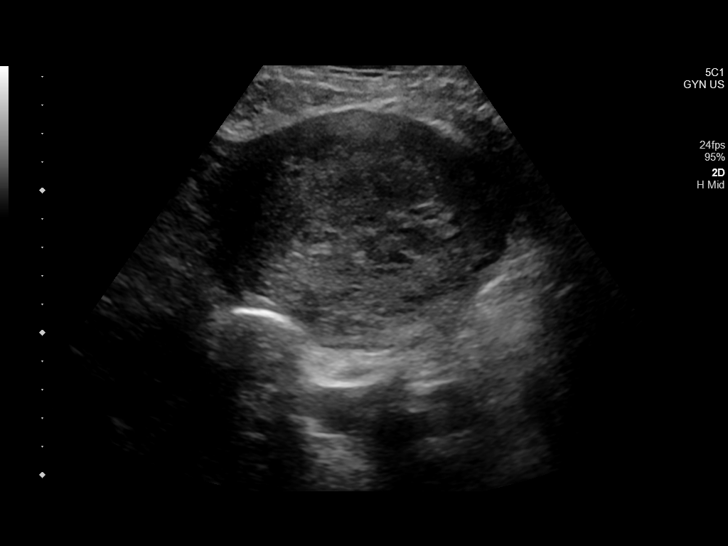
[im 18/52]
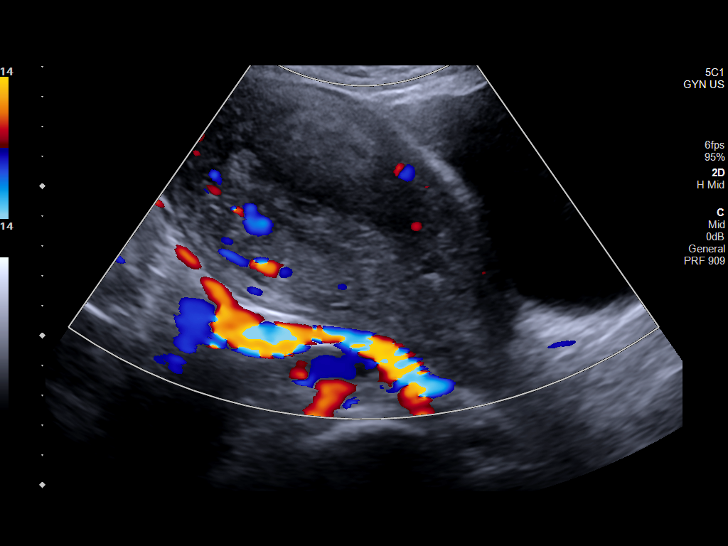
[im 20/52]
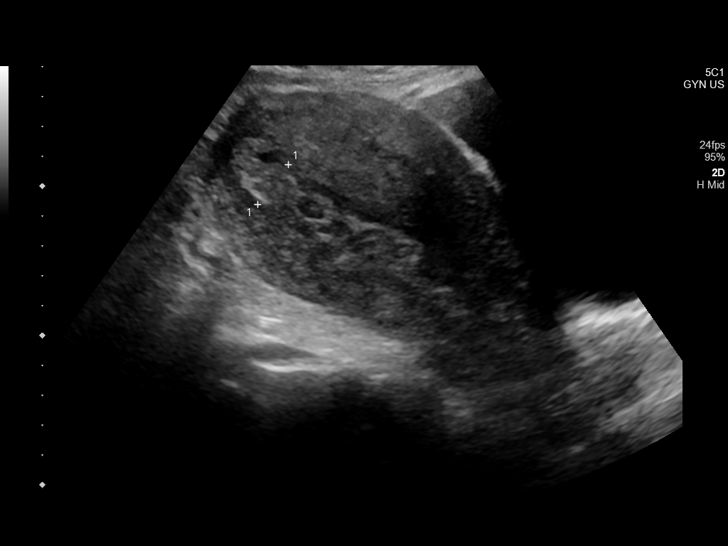
[im 24/52]
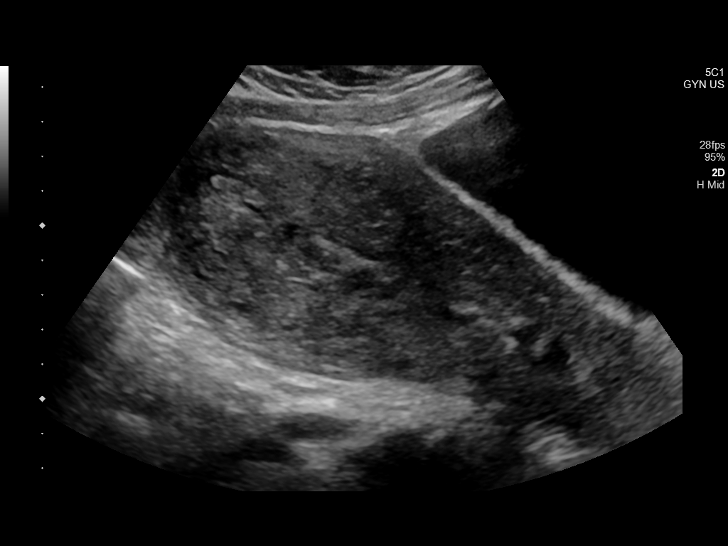
[im 28/52]
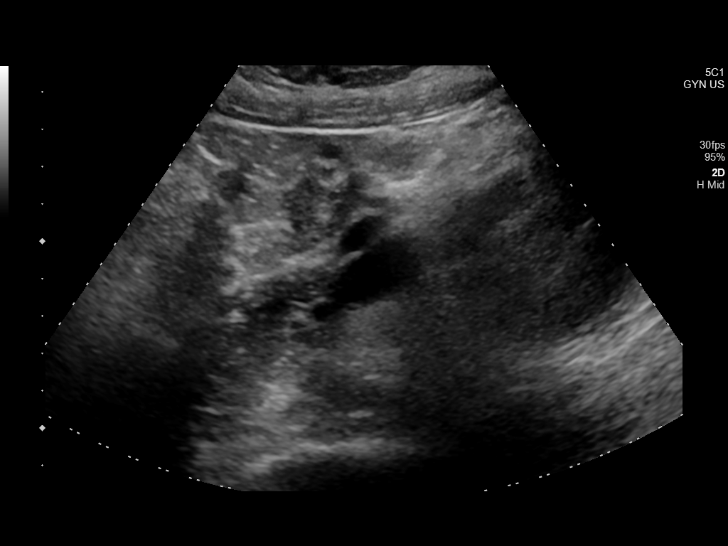
[im 32/52]
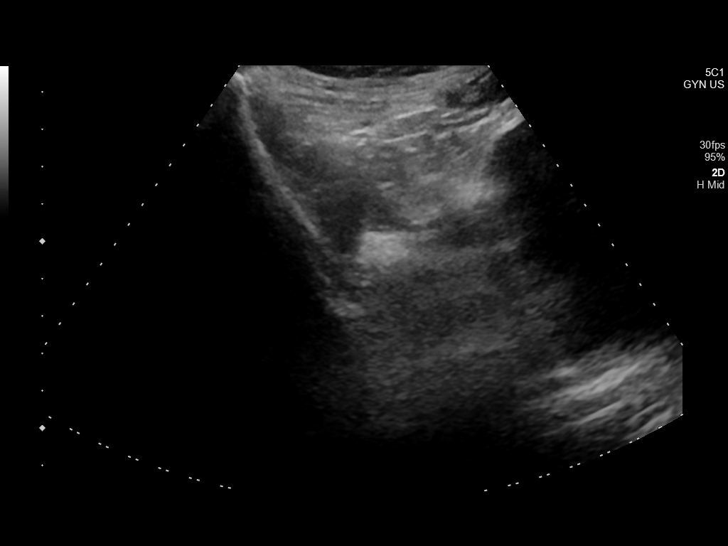
[im 35/52]
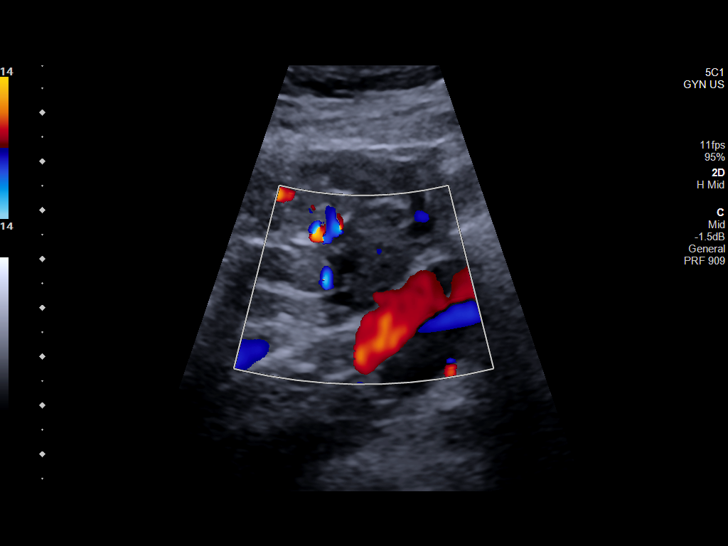
[im 39/52]
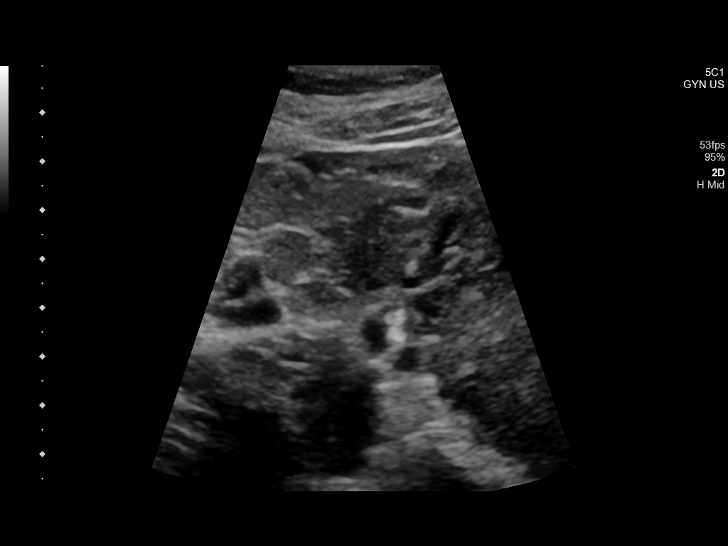
[im 43/52]
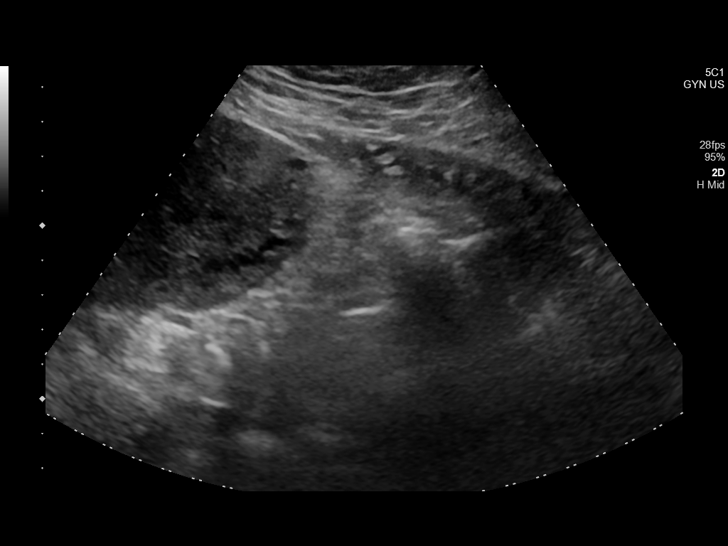
[im 47/52]
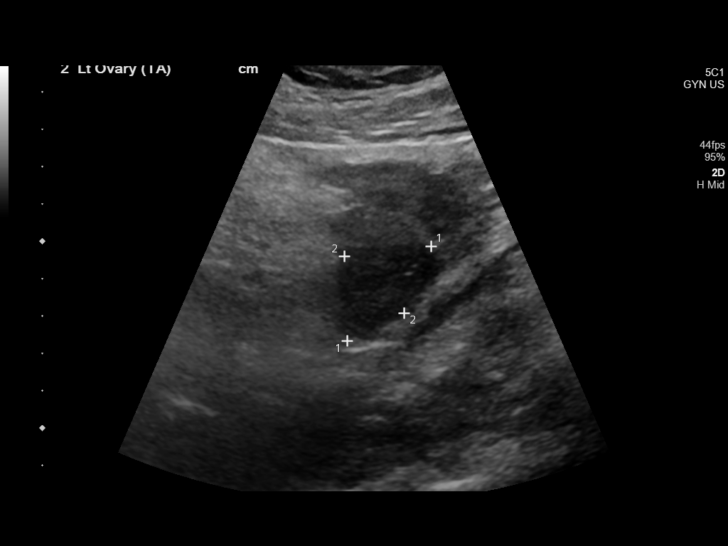
[im 52/52]
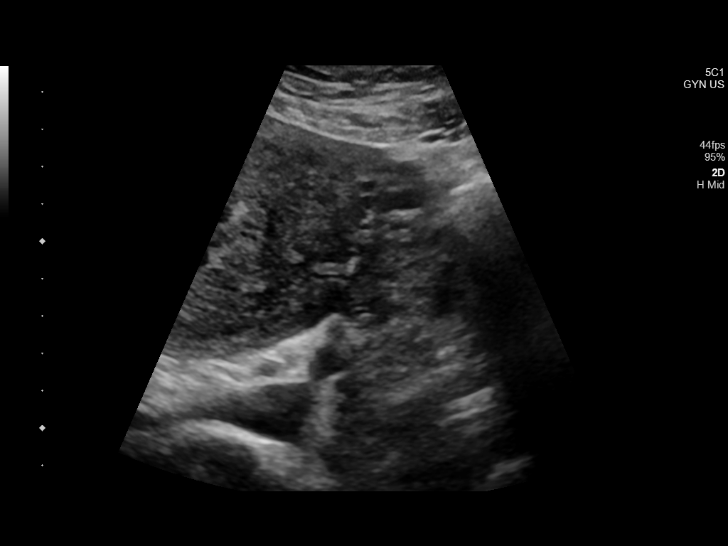

[14 of 25 positions shown; findings below may reference images not displayed]

FINDINGS: Uterus

Measurements: 16.5 x 7.5 x 10.7 cm = volume: 692 mL. No fibroids or
other mass visualized.

Endometrium

Thickness: 17 mm.  Heterogeneous.  No focal abnormality visualized.

Right ovary

Measurements: 2.4 x 1.8 x 2.0 cm = volume: 5 mL. Normal
appearance/no adnexal mass.

Left ovary

Measurements: 3.4 x 2.2 x 2.3 cm = volume: 9 mL. Normal
appearance/no adnexal mass.

Other findings:  No abnormal free fluid.
IMPRESSION: 1. Enlarged uterus compatible with recent postpartum state.
2. Thickened, heterogeneous endometrium without focal abnormality.
No specific evidence of retained products of conception.

## 2022-04-11 ENCOUNTER — Encounter: Payer: Self-pay | Admitting: Family Medicine

## 2022-04-11 ENCOUNTER — Other Ambulatory Visit (HOSPITAL_COMMUNITY)
Admission: RE | Admit: 2022-04-11 | Discharge: 2022-04-11 | Disposition: A | Discharge status: Home / Self Care | Payer: Medicaid Other | Source: Ambulatory Visit | Attending: Family Medicine | Admitting: Family Medicine

## 2022-04-11 ENCOUNTER — Ambulatory Visit (INDEPENDENT_AMBULATORY_CARE_PROVIDER_SITE_OTHER): Payer: Medicaid Other | Admitting: Family Medicine

## 2022-04-11 VITALS — BP 105/80 | HR 80 | Temp 98.7°F | Ht 65.0 in | Wt 203.6 lb

## 2022-04-11 DIAGNOSIS — O98212 Gonorrhea complicating pregnancy, second trimester: Secondary | ICD-10-CM

## 2022-04-11 DIAGNOSIS — Z3A19 19 weeks gestation of pregnancy: Secondary | ICD-10-CM | POA: Diagnosis not present

## 2022-04-11 DIAGNOSIS — Z3480 Encounter for supervision of other normal pregnancy, unspecified trimester: Secondary | ICD-10-CM

## 2022-04-11 DIAGNOSIS — Z3492 Encounter for supervision of normal pregnancy, unspecified, second trimester: Secondary | ICD-10-CM

## 2022-04-11 DIAGNOSIS — O98211 Gonorrhea complicating pregnancy, first trimester: Secondary | ICD-10-CM

## 2022-04-11 DIAGNOSIS — A549 Gonococcal infection, unspecified: Secondary | ICD-10-CM | POA: Diagnosis not present

## 2022-04-11 MED ORDER — ASPIRIN 81 MG PO TBEC: 81.0000 mg | DELAYED_RELEASE_TABLET | Freq: Every day | ORAL | 12 refills | Status: DC

## 2022-04-11 NOTE — Patient Instructions (Addendum)
It was great seeing you today!  We had your follow up OB visit today.  Start the aspirin, I have sent that to your pharmacy and continue your prenatal vitamin.  We will schedule your anatomy ultrasound and call you with that appointment.  I have scheduled for your follow-up appointment with me in 4 weeks, but if you need to be seen earlier than that for any new issues we're happy to fit you in, just give [place] a call!  Visit Reminders: - Stop by the pharmacy to pick up your prescriptions  - Continue to work on your healthy eating habits and incorporating exercise into your daily life.   Feel free to call with any questions or concerns at any time, at [phone].   Take care,  Dr. Cora Collum [facility]

## 2022-04-11 NOTE — Progress Notes (Signed)
  Patient Name: [name] Date of Birth: September 28, 2000 [facility] Center Prenatal Visit  Alyssa Howell is a 21 y.o. G2P1001 at [redacted]w[redacted]d here for routine follow up. She is dated by early ultrasound.  She reports no complaints She feels symptoms much improved. Can feel baby moving.  She denies vaginal bleeding.  See flow sheet for details.  Vitals:   04/11/22 1057  BP: 105/80  Pulse: 80  Temp: 98.7 F (37.1 C)  SpO2: 100%    Pregnancy care manager accompanying patient   A/P: Pregnancy at [redacted]w[redacted]d.  Doing well.    Routine Prenatal Care:  Dating reviewed, dating tab is correct Fetal heart tones Appropriate 146 bpm Influenza vaccine previously administered.   COVID vaccination was discussed and declines. Should discuss at follow up.  Genetic screening already completed- low risk panorama  Pregnancy education including expected weight gain in pregnancy, OTC medication use, continued use of prenatal vitamin, smoking cessation if applicable, and nutrition in pregnancy.   Bleeding and pain precautions reviewed. The patient has the following indications for aspirinto begin 81 mg at 12-16 weeks: One high risk condition: no single high risk condition  MORE than one moderate risk condition: obesity and low SES   Aspirin was  recommended today based upon above risk factors (one high risk condition or more than one moderate risk factor)   2. Pregnancy issues include the following and were addressed as appropriate today:  Gonorrhea during pregnancy- received Rocephin on 11/1 and partner treated as well. Test of cure performed today ASA prescribed for preE ppx - has not started. Resent to different pharmacy  Will be following with [facility] starting in January   Problem list  and pregnancy box updated: Yes.   Follow up 4 weeks.

## 2022-04-12 LAB — CERVICOVAGINAL ANCILLARY ONLY
Chlamydia: NEGATIVE
Comment: NEGATIVE
Comment: NEGATIVE
Comment: NORMAL
Neisseria Gonorrhea: NEGATIVE
Trichomonas: NEGATIVE

## 2022-04-24 NOTE — L&D Delivery Note (Cosign Needed Addendum)
LABOR COURSE Patient admitted on 4/12 for PPROM at [redacted]w[redacted]d. Received PCN ppx. Pit was started although due to difficulty with FHT and toco, placed IUPC and FSE. Patient progressed quickly to complete over the 3 hours following this.   Delivery Note Called to room and patient was complete and pushing. Head delivered OA. No nuchal cord noted. Shoulder and body delivered in usual fashion. At 1252 a viable female was delivered via Vaginal, Spontaneous (Presentation: OA).  Infant with spontaneous cry, placed on mother's abdomen, dried and stimulated. NICU present at bedside and therefore assessed patient at the warmer. Cord clamped x 2 after 1-minute delay, and cut by FOB. Cord blood drawn. Placenta delivered spontaneously with gentle cord traction. Appears intact. Brisk bleeding occurred which responded to uterine sweep, fundal massage, pitocin, TXA, cytotec rectally, and 0.2mg  methergine. Fundus was sufficiently firm afterwards. Labia, perineum, vagina, and cervix inspected without tears.    APGAR: 4,8 ; weight 2590 g .   Cord: 3VC with the following complications: none.   Cord pH: n/a  Anesthesia: Epidural  Episiotomy: None Lacerations: None Est. Blood Loss (mL): 658 cc  Mom to postpartum.  Baby to NICU.  Dr. Gilles Chiquito 1:54 PM     _____ GME ATTESTATION:  Evaluation and management procedures were performed by the [facility] Medicine Resident under my supervision. I was immediately available for direct supervision, assistance and direction throughout this encounter.  I also confirm that I have verified the information documented in the resident's note, and that I have also personally reperformed the pertinent components of the physical exam and all of the medical decision making activities.  I have also made any necessary editorial changes.  Myrtie Hawk, DO OB Fellow, Faculty [facility], Center for [facility] 08/05/2022 2:28 PM

## 2022-04-28 ENCOUNTER — Other Ambulatory Visit: Payer: Self-pay | Admitting: Family Medicine

## 2022-04-28 DIAGNOSIS — Z3480 Encounter for supervision of other normal pregnancy, unspecified trimester: Secondary | ICD-10-CM

## 2022-05-02 NOTE — Progress Notes (Unsigned)
206lb

## 2022-05-03 ENCOUNTER — Inpatient Hospital Stay: Admission: RE | Admit: 2022-05-03 | Payer: Medicaid Other | Source: Ambulatory Visit

## 2022-05-03 ENCOUNTER — Ambulatory Visit: Payer: Medicaid Other | Admitting: *Deleted

## 2022-05-03 ENCOUNTER — Other Ambulatory Visit: Payer: Self-pay | Admitting: *Deleted

## 2022-05-03 ENCOUNTER — Ambulatory Visit: Payer: Medicaid Other | Attending: Obstetrics

## 2022-05-03 VITALS — BP 111/58 | HR 107

## 2022-05-03 DIAGNOSIS — Z3A21 21 weeks gestation of pregnancy: Secondary | ICD-10-CM | POA: Diagnosis not present

## 2022-05-03 DIAGNOSIS — Z363 Encounter for antenatal screening for malformations: Secondary | ICD-10-CM | POA: Insufficient documentation

## 2022-05-03 DIAGNOSIS — Z3480 Encounter for supervision of other normal pregnancy, unspecified trimester: Secondary | ICD-10-CM | POA: Diagnosis not present

## 2022-05-03 DIAGNOSIS — Z3689 Encounter for other specified antenatal screening: Secondary | ICD-10-CM

## 2022-05-03 DIAGNOSIS — O99212 Obesity complicating pregnancy, second trimester: Secondary | ICD-10-CM

## 2022-05-03 DIAGNOSIS — O09292 Supervision of pregnancy with other poor reproductive or obstetric history, second trimester: Secondary | ICD-10-CM | POA: Diagnosis not present

## 2022-05-03 DIAGNOSIS — O98212 Gonorrhea complicating pregnancy, second trimester: Secondary | ICD-10-CM

## 2022-05-09 ENCOUNTER — Encounter: Payer: Self-pay | Admitting: Family Medicine

## 2022-05-18 ENCOUNTER — Inpatient Hospital Stay (HOSPITAL_BASED_OUTPATIENT_CLINIC_OR_DEPARTMENT_OTHER): Payer: Medicaid Other

## 2022-05-18 ENCOUNTER — Encounter (HOSPITAL_COMMUNITY): Payer: Self-pay | Admitting: Obstetrics & Gynecology

## 2022-05-18 ENCOUNTER — Institutional Professional Consult (permissible substitution): Payer: Self-pay | Admitting: Licensed Clinical Social Worker

## 2022-05-18 ENCOUNTER — Other Ambulatory Visit (HOSPITAL_COMMUNITY)
Admission: RE | Admit: 2022-05-18 | Discharge: 2022-05-18 | Disposition: A | Discharge status: Home / Self Care | Payer: Medicaid Other | Source: Ambulatory Visit | Attending: Student | Admitting: Student

## 2022-05-18 ENCOUNTER — Other Ambulatory Visit: Payer: Self-pay

## 2022-05-18 ENCOUNTER — Ambulatory Visit (INDEPENDENT_AMBULATORY_CARE_PROVIDER_SITE_OTHER): Payer: Medicaid Other | Admitting: Student

## 2022-05-18 ENCOUNTER — Encounter: Payer: Self-pay | Admitting: Student

## 2022-05-18 ENCOUNTER — Inpatient Hospital Stay (HOSPITAL_COMMUNITY)
Admission: AD | Admit: 2022-05-18 | Discharge: 2022-05-18 | Disposition: A | Discharge status: Home / Self Care | Payer: Medicaid Other | Attending: Obstetrics & Gynecology | Admitting: Obstetrics & Gynecology

## 2022-05-18 VITALS — BP 122/73 | HR 88 | Wt 207.2 lb

## 2022-05-18 DIAGNOSIS — Z3A23 23 weeks gestation of pregnancy: Secondary | ICD-10-CM | POA: Insufficient documentation

## 2022-05-18 DIAGNOSIS — Z0371 Encounter for suspected problem with amniotic cavity and membrane ruled out: Secondary | ICD-10-CM

## 2022-05-18 DIAGNOSIS — O26892 Other specified pregnancy related conditions, second trimester: Secondary | ICD-10-CM | POA: Insufficient documentation

## 2022-05-18 DIAGNOSIS — N898 Other specified noninflammatory disorders of vagina: Secondary | ICD-10-CM | POA: Insufficient documentation

## 2022-05-18 DIAGNOSIS — R8789 Other abnormal findings in specimens from female genital organs: Secondary | ICD-10-CM | POA: Insufficient documentation

## 2022-05-18 DIAGNOSIS — O4292 Full-term premature rupture of membranes, unspecified as to length of time between rupture and onset of labor: Secondary | ICD-10-CM | POA: Diagnosis not present

## 2022-05-18 DIAGNOSIS — O98212 Gonorrhea complicating pregnancy, second trimester: Secondary | ICD-10-CM | POA: Diagnosis not present

## 2022-05-18 DIAGNOSIS — Z3480 Encounter for supervision of other normal pregnancy, unspecified trimester: Secondary | ICD-10-CM | POA: Insufficient documentation

## 2022-05-18 HISTORY — DX: Unspecified convulsions: R56.9

## 2022-05-18 LAB — AMNISURE RUPTURE OF MEMBRANE (ROM) NOT AT ARMC: Amnisure ROM: NEGATIVE

## 2022-05-18 LAB — WET PREP, GENITAL
Clue Cells Wet Prep HPF POC: NONE SEEN
Sperm: NONE SEEN
Trich, Wet Prep: NONE SEEN
WBC, Wet Prep HPF POC: >=10 — AB (ref <10)
Yeast Wet Prep HPF POC: NONE SEEN

## 2022-05-18 LAB — POCT FERNING: Ferning, POC: NEGATIVE

## 2022-05-18 LAB — POCT NITRAZINE TEST: POCT Nitrazine (amniosure): POSITIVE

## 2022-05-18 LAB — URINALYSIS, ROUTINE W REFLEX MICROSCOPIC
Bilirubin Urine: NEGATIVE
Glucose, UA: NEGATIVE mg/dL
Hgb urine dipstick: NEGATIVE
Ketones, ur: NEGATIVE mg/dL
Nitrite: NEGATIVE
Protein, ur: NEGATIVE mg/dL
Specific Gravity, Urine: 1.018 (ref 1.005–1.030)
pH: 7 (ref 5.0–8.0)

## 2022-05-18 NOTE — Progress Notes (Incomplete)
History:   Alyssa Howell is a 22 y.o. G2P1001 at [redacted]w[redacted]d by early ultrasound being seen today for her first obstetrical visit at [facility] office, but has been receiving care with [place].  Her obstetrical history is significant for obesity and prior vaginal delivery. Patient does intend to breast and bottle feed. Pregnancy history fully reviewed.  Patient reports {[id number]}.      HISTORY: OB History  Gravida Para Term Preterm AB Living  2 1 1  0 0 1  SAB IAB Ectopic Multiple Live Births  0 0 0 0 1    # Outcome Date GA Lbr Len/2nd Weight Sex Delivery Anes PTL Lv  2 Current           1 Term  [redacted]w[redacted]d   M Vag-Spont   LIV    Recommend PAP at 22 years of age.   Past Medical History:  Diagnosis Date   ADHD    Depression    Kidney stones    Urinary reflux    Vesico-ureteral reflux    Past Surgical History:  Procedure Laterality Date   CYSTOSCOPY/URETEROSCOPY/HOLMIUM LASER/STENT PLACEMENT Right 08/02/2020   Procedure: CYSTOSCOPY/URETEROSCOPY/HOLMIUM LASER/STENT PLACEMENT;  Surgeon: Hollice Espy, MD;  Location: [facility] ORS;  Service: Urology;  Laterality: Right;   kidney stones     TONSILLECTOMY     Family History  Problem Relation Age of Onset   Hepatitis C Mother    Valvular heart disease Mother    Healthy Father    Breast cancer Maternal Grandmother    Diabetes Other    Heart attack Other    Social History   Tobacco Use   Smoking status: Never   Smokeless tobacco: Never  Vaping Use   Vaping Use: Never used  Substance Use Topics   Alcohol use: Not Currently   Drug use: Not Currently    Types: Marijuana    Comment: last used the end of September 2022 as of 02/07/2021   Allergies  Allergen Reactions   Abilify [Aripiprazole] Other (See Comments)    Seizures   Current Outpatient Medications on File Prior to Visit  Medication Sig Dispense Refill   Prenatal Vit-Fe Fumarate-FA (PRENATAL MULTIVITAMIN) TABS tablet Take 1 tablet by mouth daily at  12 noon. 90 tablet 3   aspirin EC 81 MG tablet Take 1 tablet (81 mg total) by mouth daily. Swallow whole. (Patient not taking: Reported on 05/18/2022) 30 tablet 12   Doxylamine-Pyridoxine (DICLEGIS) 10-10 MG TBEC Two tablets at bedtime on days 1 and 2; if symptoms persist, take 1 tablet in morning and 2 tablets at bedtime on day 3; if symptoms persist, may further increase to 1 tablet in morning, 1 tablet mid-afternoon, and 2 tablets at bedtime on day 4 (Patient not taking: Reported on 05/03/2022) 90 tablet 1   fluconazole (DIFLUCAN) 150 MG tablet Take 1 tablet (150 mg total) by mouth daily. (Patient not taking: Reported on 05/03/2022) 1 tablet 0   metoCLOPramide (REGLAN) 10 MG tablet Take 1 tablet (10 mg total) by mouth every 6 (six) hours. (Patient not taking: Reported on 05/18/2022) 30 tablet 2   metroNIDAZOLE (FLAGYL) 500 MG tablet Take 1 tablet (500 mg total) by mouth 2 (two) times daily. (Patient not taking: Reported on 05/03/2022) 14 tablet 0   promethazine (PHENERGAN) 25 MG tablet Take 1 tablet (25 mg total) by mouth every 6 (six) hours as needed for nausea or vomiting. (Patient not taking: Reported on 05/18/2022) 30 tablet 2   scopolamine (TRANSDERM-SCOP)  1 MG/3DAYS Place 1 patch (1.5 mg total) onto the skin every 3 (three) days. (Patient not taking: Reported on 05/03/2022) 10 patch 12   No current facility-administered medications on file prior to visit.    Review of Systems Pertinent items noted in HPI and remainder of comprehensive ROS otherwise negative. Physical Exam:   Vitals:   05/18/22 0929  BP: 122/73  Pulse: 88  Weight: 207 lb 3.2 oz (94 kg)   Fetal Heart Rate (bpm): 150 Uterus:     Pelvic Exam: Perineum: no hemorrhoids, normal perineum   Vulva: normal external genitalia, no lesions   Vagina:  normal mucosa, normal discharge   Cervix: no lesions and normal, pap smear done.    Adnexa: normal adnexa and no mass, fullness, tenderness   Bony Pelvis: average  System: General:  well-developed, well-nourished female in no acute distress   Breasts:  normal appearance, no masses or tenderness bilaterally   Skin: normal coloration and turgor, no rashes   Neurologic: oriented, normal, negative, normal mood   Extremities: normal strength, tone, and muscle mass, ROM of all joints is normal   HEENT PERRLA, extraocular movement intact and sclera clear, anicteric   Mouth/Teeth mucous membranes moist, pharynx normal without lesions and dental hygiene good   Neck supple and no masses   Cardiovascular: regular rate and rhythm   Respiratory:  no respiratory distress, normal breath sounds   Abdomen: soft, non-tender; bowel sounds normal; no masses,  no organomegaly  ***Bedside Ultrasound for FHR check: Patient informed that the ultrasound is considered a limited obstetric ultrasound and is not intended to be a complete ultrasound exam.  Patient also informed that the ultrasound is not being completed with the intent of assessing for fetal or placental anomalies or any pelvic abnormalities.  Explained that the purpose of today's ultrasound is to assess for fetal heart rate.  Patient acknowledges the purpose of the exam and the limitations of the study.     Assessment:    Pregnancy: G2P1001 Patient Active Problem List   Diagnosis Date Noted   Supervision of other normal pregnancy, antepartum 05/18/2022   Gonorrhea affecting pregnancy 02/22/2022   Vaginal pain 07/07/2021   Breast tenderness in female 07/07/2021   GERD (gastroesophageal reflux disease) 10/09/2019   Marijuana use 02/24/2019   Depression, recurrent ([place]) 02/24/2019     Plan:    1. Supervision of other normal pregnancy, antepartum - Currently on bASA  2. [redacted] weeks gestation of pregnancy ***  3. Gonorrhea affecting pregnancy in second trimester - Negative TOC   Initial labs drawn. Continue prenatal vitamins. Genetic Screening discussed, NIPS: results reviewed. Ultrasound discussed; fetal anatomic survey:  results reviewed. Problem list reviewed and updated. The nature of [place] with multiple MDs and other Advanced Practice Providers was explained to patient; also emphasized that residents, students are part of our team. Routine obstetric precautions reviewed. Return in about 4 weeks (around 06/15/2022) for LOB/GTT, IN-PERSON.@[email or web address]    Johnston Ebbs, [place] for [employer], [place]

## 2022-05-18 NOTE — MAU Note (Signed)
Alyssa Howell is a 22 y.o. at [redacted]w[redacted]d here in MAU reporting: went to OB this morning and was sent over to rule out ROM. Had + test at the office and was told she was 100% effaced. Has been noticing her underwear has been wet for the past 3-4 weeks and is feeling like she is peeing on herself. Fluid is clear. No bleeding. Intermittent sharp abdominal pains. No pain currently. +FM  Onset of complaint: ongoing  Pain score: 0/10  Vitals:   05/18/22 1136  BP: 122/68  Pulse: 96  Resp: 16  Temp: 98.9 F (37.2 C)  SpO2: 97%     FHT:159  Lab orders placed from triage: UA

## 2022-05-18 NOTE — MAU Provider Note (Signed)
History     CSN: [id number]  Arrival date and time: 05/18/22 1121   Event Date/Time   First Provider Initiated Contact with Patient 05/18/22 1209      Chief Complaint  Patient presents with   Rupture of Membranes   HPI  Luverta Korte is a 22 y.o. G2P1001 at [redacted]w[redacted]d who presents for evaluation of possible rupture of membranes. Patient reports she has had leaking of watery discharge for 3 weeks. She reports she told the provider at her new OB today and was told she was likely ruptured. She had a positive nitrizine test and was sent to MAU for further evaluation. She denies any pain.   She denies any vaginal bleeding. Denies any constipation, diarrhea or any urinary complaints. Reports normal fetal movement.   OB History     Gravida  2   Para  1   Term  1   Preterm      AB      Living  1      SAB      IAB      Ectopic      Multiple      Live Births  1           Past Medical History:  Diagnosis Date   ADHD    Depression    Kidney stones    Seizures (HCC)    reaction to medication only   Urinary reflux    Vesico-ureteral reflux     Past Surgical History:  Procedure Laterality Date   CYSTOSCOPY/URETEROSCOPY/HOLMIUM LASER/STENT PLACEMENT Right 08/02/2020   Procedure: CYSTOSCOPY/URETEROSCOPY/HOLMIUM LASER/STENT PLACEMENT;  Surgeon: Vanna Scotland, MD;  Location: [facility] ORS;  Service: Urology;  Laterality: Right;   kidney stones     TONSILLECTOMY      Family History  Problem Relation Age of Onset   Hepatitis C Mother    Valvular heart disease Mother    Other Father        no contact in over 84yrs   Breast cancer Maternal Grandmother    Diabetes Other    Heart attack Other     Social History   Tobacco Use   Smoking status: Never   Smokeless tobacco: Never  Vaping Use   Vaping Use: Never used  Substance Use Topics   Alcohol use: Not Currently   Drug use: Not Currently    Types: Marijuana    Comment: last used the end of September 2022 as  of 02/07/2021    Allergies:  Allergies  Allergen Reactions   Abilify [Aripiprazole] Other (See Comments)    Seizures    No medications prior to admission.    Review of Systems  Constitutional: Negative.  Negative for fatigue and fever.  HENT: Negative.    Respiratory: Negative.  Negative for shortness of breath.   Cardiovascular: Negative.  Negative for chest pain.  Gastrointestinal: Negative.  Negative for abdominal pain, constipation, diarrhea, nausea and vomiting.  Genitourinary:  Positive for vaginal discharge. Negative for dysuria and vaginal bleeding.  Neurological: Negative.  Negative for dizziness and headaches.   Physical Exam   Blood pressure 121/67, pulse 96, temperature 98.9 F (37.2 C), temperature source Oral, resp. rate 16, height 5\' 5"  (1.651 m), weight 94.7 kg, last menstrual period 10/22/2021, SpO2 97 %.  Patient Vitals for the past 24 hrs:  BP Temp Temp src Pulse Resp SpO2 Height Weight  05/18/22 1302 121/67 -- -- 96 -- -- -- --  05/18/22 1136 122/68 98.9 F (37.2 C)  Oral 96 16 97 % -- --  05/18/22 1132 -- -- -- -- -- -- 5\' 5"  (1.651 m) 94.7 kg    Physical Exam Vitals and nursing note reviewed.  Constitutional:      General: She is not in acute distress.    Appearance: She is well-developed.  HENT:     Head: Normocephalic.  Eyes:     Pupils: Pupils are equal, round, and reactive to light.  Cardiovascular:     Rate and Rhythm: Normal rate and regular rhythm.     Heart sounds: Normal heart sounds.  Pulmonary:     Effort: Pulmonary effort is normal. No respiratory distress.     Breath sounds: Normal breath sounds.  Abdominal:     General: Bowel sounds are normal. There is no distension.     Palpations: Abdomen is soft.     Tenderness: There is no abdominal tenderness.  Genitourinary:    Comments: Pelvic exam: Cervix pink, visually closed, without lesion, scant white creamy discharge, vaginal walls and external genitalia normal- cervix visually  long and closed Skin:    General: Skin is warm and dry.  Neurological:     Mental Status: She is alert and oriented to person, place, and time.  Psychiatric:        Mood and Affect: Mood normal.        Behavior: Behavior normal.        Thought Content: Thought content normal.        Judgment: Judgment normal.     Fetal Tracing:  Baseline: 150 Variability: moderate Accels: 10x10 Decels: none  Toco: none  MAU Course  Procedures  Results for orders placed or performed during the hospital encounter of 05/18/22 (from the past 24 hour(s))  Urinalysis, Routine w reflex microscopic -Urine, Clean Catch     Status: Abnormal   Collection Time: 05/18/22 11:54 AM  Result Value Ref Range   Color, Urine YELLOW YELLOW   APPearance HAZY (A) CLEAR   Specific Gravity, Urine 1.018 1.005 - 1.030   pH 7.0 5.0 - 8.0   Glucose, UA NEGATIVE NEGATIVE mg/dL   Hgb urine dipstick NEGATIVE NEGATIVE   Bilirubin Urine NEGATIVE NEGATIVE   Ketones, ur NEGATIVE NEGATIVE mg/dL   Protein, ur NEGATIVE NEGATIVE mg/dL   Nitrite NEGATIVE NEGATIVE   Leukocytes,Ua SMALL (A) NEGATIVE   RBC / HPF 0-5 0 - 5 RBC/hpf   WBC, UA 6-10 0 - 5 WBC/hpf   Bacteria, UA RARE (A) NONE SEEN   Squamous Epithelial / HPF 6-10 0 - 5 /HPF   Mucus PRESENT   Amnisure rupture of membrane (rom)not at [facility]     Status: None   Collection Time: 05/18/22 12:25 PM  Result Value Ref Range   Amnisure ROM NEGATIVE   Wet prep, genital     Status: Abnormal   Collection Time: 05/18/22 12:25 PM  Result Value Ref Range   Yeast Wet Prep HPF POC NONE SEEN NONE SEEN   Trich, Wet Prep NONE SEEN NONE SEEN   Clue Cells Wet Prep HPF POC NONE SEEN NONE SEEN   WBC, Wet Prep HPF POC >=10 (A) <10   Sperm NONE SEEN      [place] MFM OB Limited  Result Date: 05/18/2022 ----------------------------------------------------------------------  OBSTETRICS REPORT                       (Signed Final 05/18/2022 03:28 pm)  ---------------------------------------------------------------------- Patient Info  ID #:  [id number]                          D.O.B.:  Mar 09, 2001 (21 yrs)  Name:       Alyssa Howell                  Visit Date: 05/18/2022 01:00 pm ---------------------------------------------------------------------- Performed By  Attending:        Sander Nephew      Ref. Address:     Faculty                    MD  Performed By:     Berlinda Last          Secondary Phy.:   [facility] MAU/Triage                    RDMS  Referred By:      Angelita Ingles             [facility]                    [place] ---------------------------------------------------------------------- Orders  #  Description                           Code        Ordered By  1  [place] MFM OB LIMITED                     [phone]    Kayler Rise ----------------------------------------------------------------------  #  Order #                     Accession #                Episode #  1  [id number]                   [id number]                 [id number] ---------------------------------------------------------------------- Indications  Premature rupture of membranes - leaking       O42.90  fluid (+ Nitrazine test in office)  [redacted] weeks gestation of pregnancy                Z3A.23 ---------------------------------------------------------------------- Fetal Evaluation  Num Of Fetuses:         1  Fetal Heart Rate(bpm):  144  Cardiac Activity:       Observed  Presentation:           Cephalic  Placenta:               Posterior  P. Cord Insertion:      Previously visualized  Amniotic Fluid  AFI FV:      Within normal limits  AFI Sum(cm)     %Tile       Largest Pocket(cm)  14.9            52          7.  RUQ(cm)       RLQ(cm)       LUQ(cm)        LLQ(cm)  7  3             2.8            2.1  Comment:    No placental abruption or previa identified.  ---------------------------------------------------------------------- OB History  Gravidity:    2         Term:   1        Prem:   0        SAB:   0  TOP:          0       Ectopic:  0        Living: 1 ---------------------------------------------------------------------- Gestational Age  LMP:           29w 5d        Date:  10/22/21                   EDD:   07/29/22  Best:          23w 4d     Det. ByMarcella Dubs         EDD:   09/10/22                                      (03/06/22) ---------------------------------------------------------------------- Anatomy  Cranium:               Appears normal         Stomach:                Appears normal, left                                                                        sided  Ventricles:            Appears normal         Kidneys:                Appear normal  Choroid Plexus:        Appears normal         Bladder:                Appears normal  Diaphragm:             Appears normal ---------------------------------------------------------------------- Cervix Uterus Adnexa  Cervix  Not visualized (advanced GA >24wks)  Uterus  No abnormality visualized.  Right Ovary  No adnexal mass visualized. Not visualized.  Left Ovary  Within normal limits.  Adnexa  No abnormality visualized ---------------------------------------------------------------------- Impression  Limited exam due to premature rupture of membranes  Good fetal movement and amniotic fluid volume  The cervix was not well visualized  No evidece of placenta previa or abruption. ---------------------------------------------------------------------- Recommendations  Clinical correlation recommended. ----------------------------------------------------------------------              Lin Landsman, MD Electronically Signed Final Report   05/18/2022 03:28 pm ----------------------------------------------------------------------    MDM Labs ordered and reviewed.   UA Amnisure Wet prep and  gc/chlamydia [place] MFM OB Limited  CNM independently reviewed the imaging ordered. Imaging show normal fluid around fetus  CNM reviewed no signs of SROM at this time.  Assessment and Plan   1. Encounter for suspected premature rupture of amniotic membranes, with rupture of membranes not found   2. [redacted] weeks gestation of pregnancy     -Discharge home in stable condition -Second trimester precautions discussed -Patient advised to follow-up with OB as scheduled for prenatal care -Patient may return to MAU as needed or if her condition were to change or worsen  Rolm Bookbinder, CNM 05/18/2022, 12:09 PM

## 2022-05-18 NOTE — Progress Notes (Signed)
New pt presents for OB visit. Pt reports having an increase in discharge for approx 1 week. She states it looks like she is urinating on herself. Pt also reports having pain during intercourse. No other concerns at this time.

## 2022-05-18 NOTE — Discharge Instructions (Signed)

## 2022-05-18 NOTE — Progress Notes (Signed)
   PRENATAL VISIT NOTE  Subjective:  Alyssa Howell is a 22 y.o. G2P1001 at [redacted]w[redacted]d being seen today for the first time at [facility], but is a transfer of care from family medicine.  She is currently monitored for the following issues for this low-risk pregnancy and has Marijuana use; Depression, recurrent ([place]); GERD (gastroesophageal reflux disease); Vaginal pain; Breast tenderness in female; Gonorrhea affecting pregnancy; and Supervision of other normal pregnancy, antepartum on their problem list.  Patient reports  leaking of fluid for 2 weeks. Patient states that every time she goes to the restroom her underwear is soaked in clear fluid. Denies cramping or any vaginal discomfort. Patient also states she is having leg pain that radiates from her hip down her thigh. She reports noticing this more in the evenings.   Contractions: Not present. Vag. Bleeding: None.  Movement: Present.   The following portions of the patient's history were reviewed and updated as appropriate: allergies, current medications, past family history, past medical history, past social history, past surgical history and problem list.   Objective:   Vitals:   05/18/22 0929  BP: 122/73  Pulse: 88  Weight: 207 lb 3.2 oz (94 kg)    Fetal Status: Fetal Heart Rate (bpm): 150   Movement: Present     General:  Alert, oriented and cooperative. Patient is in no acute distress.  Skin: Skin is warm and dry. No rash noted.   Cardiovascular: Normal heart rate noted  Respiratory: Normal respiratory effort, no problems with respiration noted  Abdomen: Soft, gravid, appropriate for gestational age.  Pain/Pressure: Present     Pelvic: Cervical exam performed in the presence of a chaperone Dilation: Closed Effacement (%): 10, 20 Station: Ballotable  Extremities: Normal range of motion.  Edema: Trace  Mental Status: Normal mood and affect. Normal behavior. Normal judgment and thought content.   Assessment and Plan:  Pregnancy:  G2P1001 at [redacted]w[redacted]d 1. Supervision of other normal pregnancy, antepartum - feeling well, fetal movement is present  2. [redacted] weeks gestation of pregnancy - gtt at next visit  3. Gonorrhea affecting pregnancy in second trimester - TOC negatove on 04/11/22  4. Clear vaginal discharge 5. Positive nitrazine test - Discussed possible reasons for false positive nitrazine testing, such as blood, vaginitis and sperm. Fern slide was indeterminate, but likely negative. [facility] attending, Dr. Nelda Marseille,  informed of assessment and MD advised patient to seek care in MAU.  Preterm labor symptoms and general obstetric precautions including but not limited to vaginal bleeding, contractions, leaking of fluid and fetal movement were reviewed in detail with the patient. Please refer to After Visit Summary for other counseling recommendations.   Return in about 4 weeks (around 06/15/2022) for LOB/GTT, IN-PERSON.  Future Appointments  Date Time Provider [place]  06/15/2022  8:30 AM [facility] LAB [facility] None  06/15/2022  8:55 AM Johnston Ebbs, NP [facility] None  07/12/2022  3:15 PM [facility] NURSE [facility]  07/12/2022  3:30 PM [facility] US3 [facility] [place]    Johnston Ebbs, NP

## 2022-05-19 LAB — GC/CHLAMYDIA PROBE AMP (~~LOC~~) NOT AT ARMC
Chlamydia: NEGATIVE
Comment: NEGATIVE
Comment: NORMAL
Neisseria Gonorrhea: NEGATIVE

## 2022-05-22 LAB — CERVICOVAGINAL ANCILLARY ONLY
Bacterial Vaginitis (gardnerella): POSITIVE — AB
Candida Glabrata: NEGATIVE
Candida Vaginitis: NEGATIVE
Chlamydia: NEGATIVE
Comment: NEGATIVE
Comment: NEGATIVE
Comment: NEGATIVE
Comment: NEGATIVE
Comment: NEGATIVE
Comment: NORMAL
Neisseria Gonorrhea: NEGATIVE
Trichomonas: NEGATIVE

## 2022-05-23 ENCOUNTER — Encounter (HOSPITAL_COMMUNITY): Payer: Self-pay | Admitting: Obstetrics and Gynecology

## 2022-05-23 ENCOUNTER — Inpatient Hospital Stay (HOSPITAL_COMMUNITY)
Admission: AD | Admit: 2022-05-23 | Discharge: 2022-05-23 | Disposition: A | Discharge status: Home / Self Care | Payer: Medicaid Other | Attending: Obstetrics and Gynecology | Admitting: Obstetrics and Gynecology

## 2022-05-23 DIAGNOSIS — Z3A24 24 weeks gestation of pregnancy: Secondary | ICD-10-CM | POA: Diagnosis not present

## 2022-05-23 DIAGNOSIS — O26892 Other specified pregnancy related conditions, second trimester: Secondary | ICD-10-CM | POA: Insufficient documentation

## 2022-05-23 DIAGNOSIS — K0889 Other specified disorders of teeth and supporting structures: Secondary | ICD-10-CM | POA: Diagnosis not present

## 2022-05-23 DIAGNOSIS — Z3480 Encounter for supervision of other normal pregnancy, unspecified trimester: Secondary | ICD-10-CM

## 2022-05-23 MED ORDER — AMOXICILLIN-POT CLAVULANATE 875-125 MG PO TABS: 1.0000 | ORAL_TABLET | Freq: Two times a day (BID) | ORAL | 0 refills | Status: DC

## 2022-05-23 MED ORDER — HYDROCODONE-ACETAMINOPHEN 5-325 MG PO TABS: 1.0000 | ORAL_TABLET | ORAL | 0 refills | Status: AC | PRN

## 2022-05-23 NOTE — MAU Note (Signed)
.  Alyssa Howell is a 22 y.o. at [redacted]w[redacted]d here in MAU reporting toothache for 2 days. Upper R next to back tooth. Same tooth was filled a yr ago. Has some swelling on R side of face and pain into R side of neck and R temple. States had temp 101.2 earlier tonight. Had Tylenol 325x2 at 1700. Reports good FM and denies any pregnancy complaints  Onset of complaint: 2 days Pain score: 8 Vitals:   05/23/22 2044 05/23/22 2047  BP:  117/62  Pulse: 97   Resp: 17   Temp: 98.5 F (36.9 C)   SpO2: 100%      FHT:150 Lab orders placed from triage: none

## 2022-05-23 NOTE — MAU Provider Note (Signed)
Event Date/Time   First Provider Initiated Contact with Patient 05/23/22 2131      S [name] is a 22 y.o. G2P1001 patient who presents to MAU today with complaint of toothache x 2 days. She reports the upper RT tooth next to the back tooth; where she got a filling 1 year ago. She woke up this morning with RT side of face swelling, RT side of neck and RT temple pain. She reports her temp was 101.2 earlier this evening. She took Tylenol 650 mg around 1700. She cannot get in to see her dentist until the end of February. She has no pregnancy related complaints. She reports good (+) FM.    O BP 117/62   Pulse 97   Temp 98.5 F (36.9 C)   Resp 17   Ht 5\' 5"  (1.651 m)   Wt 95.3 kg   LMP 10/22/2021 (Exact Date)   SpO2 100%   BMI 34.95 kg/m   Physical Exam Vitals and nursing note reviewed.  Constitutional:      Appearance: Normal appearance. She is obese.     Comments: Mild distress from pain  Cardiovascular:     Rate and Rhythm: Normal rate.  Pulmonary:     Effort: Pulmonary effort is normal.  Skin:    General: Skin is warm and dry.  Neurological:     Mental Status: She is alert and oriented to person, place, and time.  Psychiatric:        Mood and Affect: Mood normal.        Behavior: Behavior normal.        Thought Content: Thought content normal.        Judgment: Judgment normal.     FHTs by doppler: 150 bpm  A Medical screening exam complete 1. Toothache 2. [redacted] weeks gestation of pregnancy    P Discharge from MAU in stable condition Rx: Augmentin 875 mg BID x 10 days  Hydrocodone 5/325 mg 1-2 tablets every 6 hours prn pain Advised to not take Tylenol at the same time or to space out taking Tylenol 6 hours from taking Hydrocodone dose. Will have to take Tylenol during working hours Dental Letter given Warning signs for worsening condition that would warrant emergency follow-up discussed Patient may return to MAU as needed for pregnancy related  complaints  Laury Deep, CNM 05/23/2022 9:32 PM

## 2022-05-23 NOTE — Progress Notes (Signed)
Laury Deep CNM in earlier to see pt and discuss plan of care and d/c plan. Written and verbal d/c instructions given and understanding voiced.

## 2022-05-23 NOTE — MAU Note (Signed)
Rolitta Dawson CNM in [place] Rm to see pt and discuss plan of care

## 2022-05-24 ENCOUNTER — Other Ambulatory Visit: Payer: Self-pay | Admitting: Student

## 2022-05-24 DIAGNOSIS — R252 Cramp and spasm: Secondary | ICD-10-CM

## 2022-05-24 DIAGNOSIS — N760 Acute vaginitis: Secondary | ICD-10-CM

## 2022-05-24 DIAGNOSIS — N898 Other specified noninflammatory disorders of vagina: Secondary | ICD-10-CM

## 2022-05-24 MED ORDER — METRONIDAZOLE 0.75 % VA GEL: 1.0000 | Freq: Every day | VAGINAL | 0 refills | Status: AC

## 2022-05-24 MED ORDER — MAG-OXIDE 200 MG PO TABS: 400.0000 mg | ORAL_TABLET | Freq: Every day | ORAL | 3 refills | Status: DC

## 2022-05-24 MED ORDER — CYCLOBENZAPRINE HCL 5 MG PO TABS: 5.0000 mg | ORAL_TABLET | Freq: Three times a day (TID) | ORAL | 0 refills | Status: DC | PRN

## 2022-06-15 ENCOUNTER — Encounter: Payer: Self-pay | Admitting: Student

## 2022-06-15 ENCOUNTER — Ambulatory Visit: Payer: Medicaid Other | Admitting: Student

## 2022-06-15 ENCOUNTER — Other Ambulatory Visit: Payer: Medicaid Other

## 2022-06-15 ENCOUNTER — Other Ambulatory Visit (HOSPITAL_COMMUNITY)
Admission: RE | Admit: 2022-06-15 | Discharge: 2022-06-15 | Disposition: A | Discharge status: Home / Self Care | Payer: Medicaid Other | Source: Ambulatory Visit | Attending: Student | Admitting: Student

## 2022-06-15 VITALS — BP 126/72 | HR 90 | Wt 213.6 lb

## 2022-06-15 DIAGNOSIS — O26899 Other specified pregnancy related conditions, unspecified trimester: Secondary | ICD-10-CM

## 2022-06-15 DIAGNOSIS — R109 Unspecified abdominal pain: Secondary | ICD-10-CM | POA: Diagnosis not present

## 2022-06-15 DIAGNOSIS — Z23 Encounter for immunization: Secondary | ICD-10-CM | POA: Diagnosis not present

## 2022-06-15 DIAGNOSIS — Z3482 Encounter for supervision of other normal pregnancy, second trimester: Secondary | ICD-10-CM | POA: Diagnosis not present

## 2022-06-15 DIAGNOSIS — N898 Other specified noninflammatory disorders of vagina: Secondary | ICD-10-CM | POA: Diagnosis not present

## 2022-06-15 DIAGNOSIS — Z3480 Encounter for supervision of other normal pregnancy, unspecified trimester: Secondary | ICD-10-CM

## 2022-06-15 DIAGNOSIS — M5431 Sciatica, right side: Secondary | ICD-10-CM

## 2022-06-15 DIAGNOSIS — Z3A27 27 weeks gestation of pregnancy: Secondary | ICD-10-CM | POA: Diagnosis not present

## 2022-06-15 NOTE — Progress Notes (Signed)
Pt presents for ROB visit. Pt reports having upper middle quadrant pain and more pain in her pelvis. TDaP given. No other concerns at this time.

## 2022-06-15 NOTE — Progress Notes (Signed)
   PRENATAL VISIT NOTE  Subjective:  Alyssa Howell is a 22 y.o. G2P1001 at 42w4dbeing seen today for ongoing prenatal care.  She is currently monitored for the following issues for this low-risk pregnancy and has Marijuana use; Depression, recurrent (HEstill; GERD (gastroesophageal reflux disease); Vaginal pain; Breast tenderness in female; Gonorrhea affecting pregnancy; and Supervision of other normal pregnancy, antepartum on their problem list.  Patient reports  continued vaginal discharge and upper abdominal pain . Abdominal pain occurred 6 days ago and lasted for 2 days. Does not have symptoms today. Worsening right sciatic pain only at night time, not improved with flexeril. Contractions: Not present. Vag. Bleeding: None.  Movement: Present. Denies leaking of fluid.   The following portions of the patient's history were reviewed and updated as appropriate: allergies, current medications, past family history, past medical history, past social history, past surgical history and problem list.   Objective:   Vitals:   06/15/22 0842  BP: 126/72  Pulse: 90  Weight: 213 lb 9.6 oz (96.9 kg)    Fetal Status: Fetal Heart Rate (bpm): 148   Movement: Present     General:  Alert, oriented and cooperative. Patient is in no acute distress.  Skin: Skin is warm and dry. No rash noted.   Cardiovascular: Normal heart rate noted  Respiratory: Normal respiratory effort, no problems with respiration noted  Abdomen: Soft, gravid, appropriate for gestational age.  Pain/Pressure: Present     Pelvic: Cervical exam deferred        Extremities: Normal range of motion.  Edema: Trace  Mental Status: Normal mood and affect. Normal behavior. Normal judgment and thought content.   Assessment and Plan:  Pregnancy: G2P1001 at 270w4d1. Supervision of other normal pregnancy, antepartum - vigorous and frequent fetal movement  2. [redacted] weeks gestation of pregnancy - third trimester labs completed today - continue  routine follow-up - Follow-up growth scan scheduled per MFM for BMI: 34 - CBC - Glucose Tolerance, 2 Hours w/1 Hour - HIV Antibody (routine testing w rflx) - RPR - Tdap vaccine greater than or equal to 7yo IM  3. Clear vaginal discharge - Completed treatment for BV - Cervicovaginal ancillary only  4. Abdominal pain affecting pregnancy, antepartum - Culture, OB Urine - Comprehensive metabolic panel - Cervicovaginal ancillary only  5. Sciatic pain, right - unrelieved by flexeril - Recommended continued magnesium and add a sleep aid (Unisom vs. Melatonin) - Plan to see chiropractor    Preterm labor symptoms and general obstetric precautions including but not limited to vaginal bleeding, contractions, leaking of fluid and fetal movement were reviewed in detail with the patient. Please refer to After Visit Summary for other counseling recommendations.   Return in about 2 weeks (around 06/29/2022) for LOB, IN-PERSON.  Future Appointments  Date Time Provider DeLagrange3/20/2024  3:15 PM [facility]3/20/2024  3:30 PM [facility] US3 [facility]  NiJohnston EbbsNP

## 2022-06-16 LAB — CBC
Hematocrit: 30.5 % — ABNORMAL LOW (ref 34.0–46.6)
Hemoglobin: 10.6 g/dL — ABNORMAL LOW (ref 11.1–15.9)
MCH: 32.5 pg (ref 26.6–33.0)
MCHC: 34.8 g/dL (ref 31.5–35.7)
MCV: 94 fL (ref 79–97)
Platelets: 311 10*3/uL (ref 150–450)
RBC: 3.26 x10E6/uL — ABNORMAL LOW (ref 3.77–5.28)
RDW: 11.8 % (ref 11.7–15.4)
WBC: 13.3 10*3/uL — ABNORMAL HIGH (ref 3.4–10.8)

## 2022-06-16 LAB — COMPREHENSIVE METABOLIC PANEL
ALT: 13 IU/L (ref 0–32)
AST: 11 IU/L (ref 0–40)
Albumin/Globulin Ratio: 1.2 (ref 1.2–2.2)
Albumin: 3.5 g/dL — ABNORMAL LOW (ref 4.0–5.0)
Alkaline Phosphatase: 151 IU/L — ABNORMAL HIGH (ref 44–121)
BUN/Creatinine Ratio: 15 (ref 9–23)
BUN: 10 mg/dL (ref 6–20)
Bilirubin Total: <0.2 mg/dL (ref 0.0–1.2)
CO2: 20 mmol/L (ref 20–29)
Calcium: 9 mg/dL (ref 8.7–10.2)
Chloride: 103 mmol/L (ref 96–106)
Creatinine, Ser: 0.67 mg/dL (ref 0.57–1.00)
Globulin, Total: 3 g/dL (ref 1.5–4.5)
Glucose: 74 mg/dL (ref 70–99)
Potassium: 4.2 mmol/L (ref 3.5–5.2)
Sodium: 137 mmol/L (ref 134–144)
Total Protein: 6.5 g/dL (ref 6.0–8.5)
eGFR: 127 mL/min/{1.73_m2} (ref >59)

## 2022-06-16 LAB — CERVICOVAGINAL ANCILLARY ONLY
Bacterial Vaginitis (gardnerella): POSITIVE — AB
Candida Glabrata: NEGATIVE
Candida Vaginitis: NEGATIVE
Chlamydia: NEGATIVE
Comment: NEGATIVE
Comment: NEGATIVE
Comment: NEGATIVE
Comment: NEGATIVE
Comment: NEGATIVE
Comment: NORMAL
Neisseria Gonorrhea: NEGATIVE
Trichomonas: NEGATIVE

## 2022-06-16 LAB — RPR: RPR Ser Ql: NONREACTIVE

## 2022-06-16 LAB — GLUCOSE TOLERANCE, 2 HOURS W/ 1HR
Glucose, 1 hour: 120 mg/dL (ref 70–179)
Glucose, 2 hour: 54 mg/dL — ABNORMAL LOW (ref 70–152)
Glucose, Fasting: 74 mg/dL (ref 70–91)

## 2022-06-16 LAB — HIV ANTIBODY (ROUTINE TESTING W REFLEX): HIV Screen 4th Generation wRfx: NONREACTIVE

## 2022-06-18 LAB — URINE CULTURE, OB REFLEX

## 2022-06-18 LAB — CULTURE, OB URINE

## 2022-06-21 ENCOUNTER — Other Ambulatory Visit: Payer: Self-pay | Admitting: Student

## 2022-06-21 DIAGNOSIS — N898 Other specified noninflammatory disorders of vagina: Secondary | ICD-10-CM

## 2022-06-21 DIAGNOSIS — B9689 Other specified bacterial agents as the cause of diseases classified elsewhere: Secondary | ICD-10-CM

## 2022-06-21 DIAGNOSIS — O2343 Unspecified infection of urinary tract in pregnancy, third trimester: Secondary | ICD-10-CM

## 2022-06-21 DIAGNOSIS — O99013 Anemia complicating pregnancy, third trimester: Secondary | ICD-10-CM

## 2022-06-21 MED ORDER — METRONIDAZOLE 500 MG PO TABS: 500.0000 mg | ORAL_TABLET | Freq: Two times a day (BID) | ORAL | 0 refills | Status: DC

## 2022-06-21 MED ORDER — NITROFURANTOIN MONOHYD MACRO 100 MG PO CAPS: 100.0000 mg | ORAL_CAPSULE | Freq: Two times a day (BID) | ORAL | 0 refills | Status: DC

## 2022-06-21 MED ORDER — FLUCONAZOLE 150 MG PO TABS: 150.0000 mg | ORAL_TABLET | Freq: Every day | ORAL | 0 refills | Status: DC

## 2022-06-21 MED ORDER — FERROUS SULFATE 325 (65 FE) MG PO TBEC: 325.0000 mg | DELAYED_RELEASE_TABLET | ORAL | 1 refills | Status: DC

## 2022-06-26 DIAGNOSIS — M9904 Segmental and somatic dysfunction of sacral region: Secondary | ICD-10-CM | POA: Diagnosis not present

## 2022-06-26 DIAGNOSIS — M5386 Other specified dorsopathies, lumbar region: Secondary | ICD-10-CM | POA: Diagnosis not present

## 2022-06-26 DIAGNOSIS — M9905 Segmental and somatic dysfunction of pelvic region: Secondary | ICD-10-CM | POA: Diagnosis not present

## 2022-06-26 DIAGNOSIS — M9903 Segmental and somatic dysfunction of lumbar region: Secondary | ICD-10-CM | POA: Diagnosis not present

## 2022-06-29 ENCOUNTER — Encounter: Payer: Self-pay | Admitting: Obstetrics and Gynecology

## 2022-06-29 ENCOUNTER — Ambulatory Visit (INDEPENDENT_AMBULATORY_CARE_PROVIDER_SITE_OTHER): Payer: Medicaid Other | Admitting: Obstetrics and Gynecology

## 2022-06-29 VITALS — BP 119/72 | HR 107 | Wt 215.2 lb

## 2022-06-29 DIAGNOSIS — O99013 Anemia complicating pregnancy, third trimester: Secondary | ICD-10-CM

## 2022-06-29 DIAGNOSIS — M5431 Sciatica, right side: Secondary | ICD-10-CM

## 2022-06-29 DIAGNOSIS — Z3A29 29 weeks gestation of pregnancy: Secondary | ICD-10-CM

## 2022-06-29 DIAGNOSIS — Z3480 Encounter for supervision of other normal pregnancy, unspecified trimester: Secondary | ICD-10-CM

## 2022-06-29 MED ORDER — FERROUS SULFATE 325 (65 FE) MG PO TBEC: 325.0000 mg | DELAYED_RELEASE_TABLET | ORAL | 3 refills | Status: DC

## 2022-06-29 MED ORDER — ASCORBIC ACID 500 MG PO TABS: 500.0000 mg | ORAL_TABLET | ORAL | 3 refills | Status: DC

## 2022-06-29 NOTE — Progress Notes (Signed)
Pt presents for ROB visit. No concerns at this time.

## 2022-06-29 NOTE — Progress Notes (Signed)
LOW-RISK PREGNANCY OFFICE VISIT Patient name: Alyssa Howell MRN [id number]  Date of birth: November 07, 2000 Chief Complaint:   Routine Prenatal Visit  History of Present Illness:   Alyssa Howell is a 22 y.o. G35P1001 female at 73w4dwith an Estimated Date of Delivery: 09/10/22 being seen today for ongoing management of a low-risk pregnancy.  Today she reports no complaints and have not been taking iron supplements, because "I can't afford to buy them" . Her sciatic nerve pain has improved since she has been going to the chiropractor. Contractions: Not present. Vag. Bleeding: None.  Movement: Present. denies leaking of fluid. Review of Systems:   Pertinent items are noted in HPI Denies abnormal vaginal discharge w/ itching/odor/irritation, headaches, visual changes, shortness of breath, chest pain, abdominal pain, severe nausea/vomiting, or problems with urination or bowel movements unless otherwise stated above. Pertinent History Reviewed:  Reviewed past medical,surgical, social, obstetrical and family history.  Reviewed problem list, medications and allergies. Physical Assessment:   Vitals:   06/29/22 1029  BP: 119/72  Pulse: (!) 107  Weight: 215 lb 3.2 oz (97.6 kg)  Body mass index is 35.81 kg/m.        Physical Examination:   General appearance: Well appearing, and in no distress  Mental status: Alert, oriented to person, place, and time  Skin: Warm & dry  Cardiovascular: Normal heart rate noted  Respiratory: Normal respiratory effort, no distress  Abdomen: Soft, gravid, nontender  Pelvic: Cervical exam deferred         Extremities: Edema: None  Fetal Status: Fetal Heart Rate (bpm): 149 Fundal Height: 32 cm Movement: Present    No results found for this or any previous visit (from the past 24 hour(s)).  Assessment & Plan:  1) Low-risk pregnancy G2P1001 at 239w4dith an Estimated Date of Delivery: 09/10/22   2) Supervision of other normal pregnancy, antepartum - Low-Risk normal  pregnancy  3) Anemia affecting pregnancy in third trimester - Rx: ascorbic acid (VITAMIN C) 500 MG tablet; Take 1 tablet (500 mg total) by mouth every other day. Take with iron pill  Dispense: 30 tablet; Refill: 3 - Rx renewal: ferrous sulfate 325 (65 FE) MG EC tablet; Take 1 tablet (325 mg total) by mouth every other day.  Dispense: 45 tablet; Refill: 3  4) Sciatic pain, right - Continue chiropractor care    Meds:  Meds ordered this encounter  Medications   ascorbic acid (VITAMIN C) 500 MG tablet    Sig: Take 1 tablet (500 mg total) by mouth every other day. Take with iron pill    Dispense:  30 tablet    Refill:  3    Order Specific Question:   Supervising Provider    Answer:   PRDonnamae [id number]]   ferrous sulfate 325 (65 FE) MG EC tablet    Sig: Take 1 tablet (325 mg total) by mouth every other day.    Dispense:  45 tablet    Refill:  3    Order Specific Question:   Supervising Provider    Answer:   PRDonnamae [id number] Labs/procedures today: none  Plan:  Continue routine obstetrical care  Reviewed: Preterm labor symptoms and general obstetric precautions including but not limited to vaginal bleeding, contractions, leaking of fluid and fetal movement were reviewed in detail with the patient.  All questions were answered. Has home bp cuff. Check bp weekly, let usKoreanow if >140/90.   Follow-up: Return in about 2 weeks (around 07/13/2022)  for Return OB visit.  No orders of the defined types were placed in this encounter.  Laury Deep MSN, CNM 06/29/2022 10:54 AM

## 2022-07-05 ENCOUNTER — Encounter (HOSPITAL_COMMUNITY): Payer: Self-pay | Admitting: Obstetrics and Gynecology

## 2022-07-05 ENCOUNTER — Inpatient Hospital Stay (HOSPITAL_COMMUNITY)
Admission: AD | Admit: 2022-07-05 | Discharge: 2022-07-05 | Disposition: A | Discharge status: Home / Self Care | Payer: Medicaid Other | Attending: Obstetrics and Gynecology | Admitting: Obstetrics and Gynecology

## 2022-07-05 DIAGNOSIS — R102 Pelvic and perineal pain: Secondary | ICD-10-CM | POA: Diagnosis not present

## 2022-07-05 DIAGNOSIS — O26893 Other specified pregnancy related conditions, third trimester: Secondary | ICD-10-CM | POA: Diagnosis present

## 2022-07-05 DIAGNOSIS — Z3493 Encounter for supervision of normal pregnancy, unspecified, third trimester: Secondary | ICD-10-CM

## 2022-07-05 DIAGNOSIS — Z3689 Encounter for other specified antenatal screening: Secondary | ICD-10-CM

## 2022-07-05 DIAGNOSIS — R252 Cramp and spasm: Secondary | ICD-10-CM

## 2022-07-05 DIAGNOSIS — Z3A30 30 weeks gestation of pregnancy: Secondary | ICD-10-CM | POA: Diagnosis not present

## 2022-07-05 LAB — WET PREP, GENITAL
Clue Cells Wet Prep HPF POC: NONE SEEN
Sperm: NONE SEEN
Trich, Wet Prep: NONE SEEN
WBC, Wet Prep HPF POC: >=10 — AB (ref <10)
Yeast Wet Prep HPF POC: NONE SEEN

## 2022-07-05 LAB — AMNISURE RUPTURE OF MEMBRANE (ROM) NOT AT ARMC: Amnisure ROM: NEGATIVE

## 2022-07-05 MED ORDER — LACTATED RINGERS IV BOLUS
1000.0000 mL | Freq: Once | INTRAVENOUS | Status: AC
  Administered 2022-07-05: 1000 mL via INTRAVENOUS

## 2022-07-05 MED ORDER — CYCLOBENZAPRINE HCL 5 MG PO TABS
10.0000 mg | ORAL_TABLET | Freq: Once | ORAL | Status: AC
  Administered 2022-07-05: 10 mg via ORAL
  Filled 2022-07-05: qty 2

## 2022-07-05 MED ORDER — ACETAMINOPHEN 500 MG PO TABS
1000.0000 mg | ORAL_TABLET | Freq: Once | ORAL | Status: AC
  Administered 2022-07-05: 1000 mg via ORAL
  Filled 2022-07-05: qty 2

## 2022-07-05 MED ORDER — CYCLOBENZAPRINE HCL 5 MG PO TABS: 5.0000 mg | ORAL_TABLET | Freq: Three times a day (TID) | ORAL | 0 refills | Status: DC | PRN

## 2022-07-05 NOTE — MAU Provider Note (Signed)
History     CSN: [id number]  Arrival date and time: 07/05/22 1334   Event Date/Time   First Provider Initiated Contact with Patient 07/05/22 1448      Chief Complaint  Patient presents with   Back Pain   Pelvic Pain   Rupture of Membranes   HPI Aniyjah Howell is a 22 y.o. G2P1001 at 3w3dwho presents to MAU with chief complaint of leaking of fluid. This is a new problem, onset today. She denies gush of fluid but feels that her underwear is constantly wet. She is remote from intercourse.   Patient also reports ongoing pelvic and low back pain. Pain is 10/10 on arrival to MAU. Pain intensifies with walking and prolonged standing. She denies alleviating factors. She has not taken medication for this complaint.  She denies vaginal bleeding, DFM, dysuria, fever or recent illness.  [facility] [place] [facility] [place] [facility] [place]   OB History     Gravida  2   Para  1   Term  1   Preterm      AB      Living  1      SAB      IAB      Ectopic      Multiple      Live Births  1           Past Medical History:  Diagnosis Date   ADHD    Depression    Kidney stones    Seizures (HCC)    reaction to medication only   Urinary reflux    Vesico-ureteral reflux     Past Surgical History:  Procedure Laterality Date   CYSTOSCOPY/URETEROSCOPY/HOLMIUM LASER/STENT PLACEMENT Right 08/02/2020   Procedure: CYSTOSCOPY/URETEROSCOPY/HOLMIUM LASER/STENT PLACEMENT;  Surgeon: BHollice Espy MD;  Location: [facility] ORS;  Service: Urology;  Laterality: Right;   kidney stones     TONSILLECTOMY      Family History  Problem Relation Age of Onset   Hepatitis C Mother    Valvular heart disease Mother    Other Father        no contact in over 252yr  Breast cancer Maternal Grandmother    Diabetes Other    Heart attack Other     Social History   Tobacco Use   Smoking status: Never   Smokeless tobacco: Never  Vaping Use   Vaping Use: Never used  Substance Use Topics    Alcohol use: Not Currently   Drug use: Not Currently    Types: Marijuana    Comment: last used the end of September 2022 as of 02/07/2021    Allergies:  Allergies  Allergen Reactions   Abilify [Aripiprazole] Other (See Comments)    Seizures    Medications Prior to Admission  Medication Sig Dispense Refill Last Dose   cyclobenzaprine (FLEXERIL) 5 MG tablet Take 1 tablet (5 mg total) by mouth 3 (three) times daily as needed for muscle spasms. 20 tablet 0 Past Week   Magnesium Oxide -Mg Supplement (MAG-OXIDE) 200 MG TABS Take 2 tablets (400 mg total) by mouth at bedtime. If that amount causes loose stools in the am, switch to '200mg'$  daily at bedtime. 60 tablet 3 Past Week   metroNIDAZOLE (FLAGYL) 500 MG tablet Take 1 tablet (500 mg total) by mouth 2 (two) times daily. 14 tablet 0 Past Week   nitrofurantoin, macrocrystal-monohydrate, (MACROBID) 100 MG capsule Take 1 capsule (100 mg total) by mouth 2 (two) times daily. 14 capsule 0 Past Week  Prenatal Vit-Fe Fumarate-FA (PRENATAL MULTIVITAMIN) TABS tablet Take 1 tablet by mouth daily at 12 noon. 90 tablet 3 07/04/2022   ascorbic acid (VITAMIN C) 500 MG tablet Take 1 tablet (500 mg total) by mouth every other day. Take with iron pill 30 tablet 3 Unknown   aspirin EC 81 MG tablet Take 1 tablet (81 mg total) by mouth daily. Swallow whole. 30 tablet 12 Unknown   ferrous sulfate 325 (65 FE) MG EC tablet Take 1 tablet (325 mg total) by mouth every other day. 45 tablet 3 Unknown   fluconazole (DIFLUCAN) 150 MG tablet Take 1 tablet (150 mg total) by mouth daily. After completing your antibiotics to prevent the occurrence of a vaginal yeast infection 1 tablet 0 Unknown    Review of Systems  Gastrointestinal:  Positive for abdominal pain.  Genitourinary:  Positive for pelvic pain.  Musculoskeletal:  Positive for back pain.  All other systems reviewed and are negative.  Physical Exam   Blood pressure 114/69, pulse (!) 104, temperature 98.4 F (36.9  C), temperature source Oral, resp. rate 20, height '5\' 5"'$  (1.651 m), weight 97.7 kg, last menstrual period 10/22/2021, SpO2 99 %.  Physical Exam Vitals and nursing note reviewed. Exam conducted with a chaperone present.  Constitutional:      General: She is in acute distress.     Appearance: Normal appearance.  Cardiovascular:     Rate and Rhythm: Tachycardia present.     Pulses: Normal pulses.  Pulmonary:     Effort: Pulmonary effort is normal.  Abdominal:     Comments: Gravid  Genitourinary:    Comments: Pelvic exam: External genitalia normal, vaginal walls pink and well rugated, cervix visually closed, no lesions noted. Negative pooling. Bimanual: cervix 0/thick/posterior.  Skin:    Capillary Refill: Capillary refill takes less than 2 seconds.  Neurological:     Mental Status: She is alert and oriented to person, place, and time.  Psychiatric:        Mood and Affect: Mood normal.        Behavior: Behavior normal.        Thought Content: Thought content normal.        Judgment: Judgment normal.     MAU Course  Procedures  MDM --1630: CNM returned to bedside. Patient on phone, improved external signs of discomfort. Reports ongoing back pain. EFM discontinued, will continue Toco only until 1630. Patient assisted to left lateral, reports immediate improvement in pain score.   --1730: CNM returned to bedside. Patient sleeping. Cervix remains closed/thick/posterior. Reviewed plan for discharge home with return precautions.  --Reactive tracing: baseline 140, mod var, + accels, no decels  --Toco: brief episodes of UI, predominantly quiet  Patient Vitals for the past 24 hrs:  BP Temp Temp src Pulse Resp SpO2 Height Weight  07/05/22 1814 111/70 98.1 F (36.7 C) Oral 80 18 100 % -- --  07/05/22 1435 114/69 98.4 F (36.9 C) Oral (!) 104 20 99 % '5\' 5"'$  (1.651 m) 97.7 kg   Results for orders placed or performed during the hospital encounter of 07/05/22 (from the past 24 hour(s))   Amnisure rupture of membrane (rom)not at [facility]     Status: None   Collection Time: 07/05/22  2:56 PM  Result Value Ref Range   Amnisure ROM NEGATIVE   Wet prep, genital     Status: Abnormal   Collection Time: 07/05/22  2:56 PM   Specimen: PATH Cytology Cervicovaginal Ancillary Only  Result Value Ref Range  Yeast Wet Prep HPF POC NONE SEEN NONE SEEN   Trich, Wet Prep NONE SEEN NONE SEEN   Clue Cells Wet Prep HPF POC NONE SEEN NONE SEEN   WBC, Wet Prep HPF POC >=10 (A) <10   Sperm NONE SEEN    Meds ordered this encounter  Medications   lactated ringers bolus 1,000 mL   acetaminophen (TYLENOL) tablet 1,000 mg   cyclobenzaprine (FLEXERIL) tablet 10 mg   cyclobenzaprine (FLEXERIL) 5 MG tablet    Sig: Take 1 tablet (5 mg total) by mouth 3 (three) times daily as needed for up to 30 doses for muscle spasms.    Dispense:  30 tablet    Refill:  0   Assessment and Plan  --22 y.o. G2P1001 at [redacted]w[redacted]d --Reactive tracing --Closed cervix --Patient sleeping prior to discharge --Consider referral to PT for ongoing pelvic pain --Discharge home in stable condition  SDarlina Rumpf MMarion MSN, CNM 07/05/2022, 6:59 PM

## 2022-07-05 NOTE — MAU Note (Signed)
Alyssa Howell is a 22 y.o. at 52w3dhere in MAU reporting: around 9:30 last night was contracting so bad, she was nauseated.  Tried to sleep it off, was able to get some sleep. Woke up at 10 this morning, can hardly walk due to the pain in her pelvis and low back.  Has also had diarrhea (watery/loose x2)  no one else having GI issues at home. The pain is constant. Noted her panties are wet, has not felt any gush or anything.   Onset of complaint: 1000 Pain score: 10 Vitals:   07/05/22 1435  BP: 114/69  Pulse: (!) 104  Resp: 20  Temp: 98.4 F (36.9 C)  SpO2: 99%     FHT:156 Lab orders placed from triage:

## 2022-07-05 NOTE — Discharge Instructions (Signed)

## 2022-07-06 LAB — GC/CHLAMYDIA PROBE AMP (~~LOC~~) NOT AT ARMC
Chlamydia: NEGATIVE
Comment: NEGATIVE
Comment: NORMAL
Neisseria Gonorrhea: NEGATIVE

## 2022-07-12 ENCOUNTER — Inpatient Hospital Stay (HOSPITAL_COMMUNITY)
Admission: AD | Admit: 2022-07-12 | Discharge: 2022-07-12 | Disposition: A | Discharge status: Home / Self Care | Payer: Medicaid Other | Attending: Family Medicine | Admitting: Family Medicine

## 2022-07-12 ENCOUNTER — Ambulatory Visit: Payer: Medicaid Other | Admitting: *Deleted

## 2022-07-12 ENCOUNTER — Encounter (HOSPITAL_COMMUNITY): Payer: Self-pay | Admitting: Family Medicine

## 2022-07-12 ENCOUNTER — Ambulatory Visit: Payer: Medicaid Other | Attending: Obstetrics

## 2022-07-12 VITALS — BP 125/74 | HR 112

## 2022-07-12 DIAGNOSIS — O418X30 Other specified disorders of amniotic fluid and membranes, third trimester, not applicable or unspecified: Secondary | ICD-10-CM | POA: Diagnosis not present

## 2022-07-12 DIAGNOSIS — Z3480 Encounter for supervision of other normal pregnancy, unspecified trimester: Secondary | ICD-10-CM

## 2022-07-12 DIAGNOSIS — Z8279 Family history of other congenital malformations, deformations and chromosomal abnormalities: Secondary | ICD-10-CM

## 2022-07-12 DIAGNOSIS — O4700 False labor before 37 completed weeks of gestation, unspecified trimester: Secondary | ICD-10-CM

## 2022-07-12 DIAGNOSIS — R102 Pelvic and perineal pain: Secondary | ICD-10-CM

## 2022-07-12 DIAGNOSIS — O98213 Gonorrhea complicating pregnancy, third trimester: Secondary | ICD-10-CM | POA: Diagnosis present

## 2022-07-12 DIAGNOSIS — N94819 Vulvodynia, unspecified: Secondary | ICD-10-CM | POA: Diagnosis not present

## 2022-07-12 DIAGNOSIS — E669 Obesity, unspecified: Secondary | ICD-10-CM | POA: Diagnosis not present

## 2022-07-12 DIAGNOSIS — O26893 Other specified pregnancy related conditions, third trimester: Secondary | ICD-10-CM | POA: Insufficient documentation

## 2022-07-12 DIAGNOSIS — Z3A31 31 weeks gestation of pregnancy: Secondary | ICD-10-CM | POA: Insufficient documentation

## 2022-07-12 DIAGNOSIS — O4703 False labor before 37 completed weeks of gestation, third trimester: Secondary | ICD-10-CM | POA: Insufficient documentation

## 2022-07-12 DIAGNOSIS — O99213 Obesity complicating pregnancy, third trimester: Secondary | ICD-10-CM | POA: Insufficient documentation

## 2022-07-12 DIAGNOSIS — O99212 Obesity complicating pregnancy, second trimester: Secondary | ICD-10-CM | POA: Diagnosis present

## 2022-07-12 LAB — URINALYSIS, ROUTINE W REFLEX MICROSCOPIC
Bilirubin Urine: NEGATIVE
Glucose, UA: NEGATIVE mg/dL
Ketones, ur: NEGATIVE mg/dL
Nitrite: NEGATIVE
Protein, ur: NEGATIVE mg/dL
RBC / HPF: >50 RBC/hpf (ref 0–5)
Specific Gravity, Urine: 1.016 (ref 1.005–1.030)
pH: 7 (ref 5.0–8.0)

## 2022-07-12 LAB — FETAL FIBRONECTIN: Fetal Fibronectin: NEGATIVE

## 2022-07-12 MED ORDER — PROMETHAZINE HCL 25 MG PO TABS
25.0000 mg | ORAL_TABLET | Freq: Once | ORAL | Status: AC
  Administered 2022-07-12: 25 mg via ORAL
  Filled 2022-07-12: qty 1

## 2022-07-12 MED ORDER — OXYCODONE-ACETAMINOPHEN 5-325 MG PO TABS
1.0000 | ORAL_TABLET | Freq: Once | ORAL | Status: AC
  Administered 2022-07-12: 1 via ORAL
  Filled 2022-07-12: qty 1

## 2022-07-12 MED ORDER — NIFEDIPINE 10 MG PO CAPS
10.0000 mg | ORAL_CAPSULE | ORAL | Status: DC | PRN
  Administered 2022-07-12: 10 mg via ORAL
  Filled 2022-07-12: qty 1

## 2022-07-12 MED ORDER — TERBUTALINE SULFATE 1 MG/ML IJ SOLN
0.2500 mg | Freq: Once | INTRAMUSCULAR | Status: AC
  Administered 2022-07-12: 0.25 mg via SUBCUTANEOUS
  Filled 2022-07-12: qty 1

## 2022-07-12 NOTE — MAU Note (Signed)
.  Alyssa Howell is a 22 y.o. at [redacted]w[redacted]d here in MAU reporting: constant arching vaginal pain with intermittent sharp pain for the last few days and has increased with intensity. Pt states she took Tylenol PM 500mg  around 1830, Flexeril 10mg  around 1200 and 1500 with no relief. Pt states "this does not feel like labor or lightening crotch, but like something is wrong down there". Pt report some increase frequency in urination and oliguria.  Pt denies VB, LOF, DFM, dysuria, abnormal discharge, PIH s/s, and complications in the pregnancy.   Onset of complaint: ongoing Pain score: 10/10 Vitals:   07/12/22 2045  BP: 114/71  Pulse: 100  Resp: 20  Temp: 97.8 F (36.6 C)  SpO2: 98%     FHT:153 Lab orders placed from triage:  UA

## 2022-07-12 NOTE — MAU Provider Note (Signed)
Chief Complaint:  Vaginal Pain   Event Date/Time   First Provider Initiated Contact with Patient 07/12/22 2103     HPI: Alyssa Howell is a 22 y.o. G2P1001 at 65w3dwho presents to maternity admissions reporting pelvic and vaginal pain since yesterday.  Has had it somewhat for a week or so but worsened yesterday. States the doctor's office told her to come in by ambulance tonight.  Not sure if she is having contractions, pain is mostly in vagina and vulva. . She reports good fetal movement, denies LOF, vaginal bleeding, vaginal itching/burning, urinary symptoms, h/a, n/v, diarrhea, constipation or fever/chills.    Vaginal Pain The patient's primary symptoms include pelvic pain. The patient's pertinent negatives include no genital itching, genital odor or vaginal bleeding. This is a recurrent problem. The current episode started yesterday. The problem occurs constantly. The problem has been unchanged. She is pregnant. Associated symptoms include abdominal pain (mild pelvic cramping). Pertinent negatives include no back pain, chills, constipation, diarrhea, dysuria, fever or frequency. The symptoms are aggravated by activity and tactile pressure. She has tried nothing for the symptoms.   RN Note:  .Alyssa Howell is a 22 y.o. at [redacted]w[redacted]d here in MAU reporting: constant arching vaginal pain with intermittent sharp pain for the last few days and has increased with intensity. Pt states she took Tylenol PM 500mg  around 1830, Flexeril 10mg  around 1200 and 1500 with no relief. Pt states "this does not feel like labor or lightening crotch, but like something is wrong down there". Pt report some increase frequency in urination and oliguria.  Pt denies VB, LOF, DFM, dysuria, abnormal discharge, PIH s/s, and complications in the pregnancy.   Onset of complaint: ongoing                 Pain score: 10/10    Past Medical History: Past Medical History:  Diagnosis Date   ADHD    Depression    Kidney stones     Seizures (HCC)    reaction to medication only   Urinary reflux    Vesico-ureteral reflux     Past obstetric history: OB History  Gravida Para Term Preterm AB Living  2 1 1     1   SAB IAB Ectopic Multiple Live Births          1    # Outcome Date GA Lbr Len/2nd Weight Sex Delivery Anes PTL Lv  2 Current           1 Term  [redacted]w[redacted]d   M Vag-Spont   LIV    Past Surgical History: Past Surgical History:  Procedure Laterality Date   CYSTOSCOPY/URETEROSCOPY/HOLMIUM LASER/STENT PLACEMENT Right 08/02/2020   Procedure: CYSTOSCOPY/URETEROSCOPY/HOLMIUM LASER/STENT PLACEMENT;  Surgeon: Hollice Espy, MD;  Location: [facility] ORS;  Service: Urology;  Laterality: Right;   kidney stones     TONSILLECTOMY      Family History: Family History  Problem Relation Age of Onset   Hepatitis C Mother    Valvular heart disease Mother    Other Father        no contact in over 49yrs   Cancer Maternal Grandmother    Breast cancer Maternal Grandmother    Diabetes Other    Heart attack Other    Asthma Neg Hx    Heart disease Neg Hx    Hypertension Neg Hx     Social History: Social History   Tobacco Use   Smoking status: Never   Smokeless tobacco: Never  Vaping Use   Vaping  Use: Never used  Substance Use Topics   Alcohol use: Not Currently   Drug use: Not Currently    Types: Marijuana    Comment: last used the end of September 2022 as of 02/07/2021    Allergies:  Allergies  Allergen Reactions   Abilify [Aripiprazole] Other (See Comments)    Seizures    Meds:  Medications Prior to Admission  Medication Sig Dispense Refill Last Dose   cyclobenzaprine (FLEXERIL) 5 MG tablet Take 1 tablet (5 mg total) by mouth 3 (three) times daily as needed for up to 30 doses for muscle spasms. 30 tablet 0 07/12/2022   diphenhydramine-acetaminophen (TYLENOL PM) 25-500 MG TABS tablet Take 1 tablet by mouth at bedtime as needed.   07/12/2022 at 1830   ferrous sulfate 325 (65 FE) MG EC tablet Take 1 tablet (325  mg total) by mouth every other day. 45 tablet 3 07/12/2022   ascorbic acid (VITAMIN C) 500 MG tablet Take 1 tablet (500 mg total) by mouth every other day. Take with iron pill (Patient not taking: Reported on 07/12/2022) 30 tablet 3    aspirin EC 81 MG tablet Take 1 tablet (81 mg total) by mouth daily. Swallow whole. (Patient not taking: Reported on 07/12/2022) 30 tablet 12    Magnesium Oxide -Mg Supplement (MAG-OXIDE) 200 MG TABS Take 2 tablets (400 mg total) by mouth at bedtime. If that amount causes loose stools in the am, switch to 200mg  daily at bedtime. 60 tablet 3    Prenatal Vit-Fe Fumarate-FA (PRENATAL MULTIVITAMIN) TABS tablet Take 1 tablet by mouth daily at 12 noon. 90 tablet 3     I have reviewed patient's Past Medical Hx, Surgical Hx, Family Hx, Social Hx, medications and allergies.   ROS:  Review of Systems  Constitutional:  Negative for chills and fever.  Gastrointestinal:  Positive for abdominal pain (mild pelvic cramping). Negative for constipation and diarrhea.  Genitourinary:  Positive for pelvic pain and vaginal pain. Negative for dysuria and frequency.  Musculoskeletal:  Negative for back pain.   Other systems negative  Physical Exam  Patient Vitals for the past 24 hrs:  BP Temp Temp src Pulse Resp SpO2 Height Weight  07/12/22 2045 114/71 97.8 F (36.6 C) Oral 100 20 98 % 5\' 6"  (1.676 m) 98.5 kg   Constitutional: Well-developed, well-nourished female in no acute distress.  Cardiovascular: normal rate  Respiratory: normal effort GI: Abd soft, non-tender, gravid appropriate for gestational age.   No rebound or guarding. MS: Extremities nontender, no edema, normal ROM Neurologic: Alert and oriented x 4.  GU: Neg CVAT.  PELVIC EXAM:  Bimanual exam: Cervix soft and multiparous, posterior, neg CMT, uterus nontender, Fundal Height consistent with dates, adnexa without tenderness, enlargement, or mass Perineum, vulva and introitus are all tender to touch Particular  tenderness noted at posterior introitus.  No erythema noted on vulva. Dilation: 1 Effacement (%): 30 Station: Ballotable Exam by:: Hansel Feinstein, CNM.   FHT:  Baseline 140 , moderate variability, accelerations present, no decelerations Contractions: Uterine irritability with contractions lasting 10-30 seconds each   Labs: Results for orders placed or performed during the hospital encounter of 07/12/22 (from the past 24 hour(s))  Urinalysis, Routine w reflex microscopic -Urine, Clean Catch     Status: Abnormal   Collection Time: 07/12/22  8:51 PM  Result Value Ref Range   Color, Urine YELLOW YELLOW   APPearance HAZY (A) CLEAR   Specific Gravity, Urine 1.016 1.005 - 1.030   pH 7.0  5.0 - 8.0   Glucose, UA NEGATIVE NEGATIVE mg/dL   Hgb urine dipstick MODERATE (A) NEGATIVE   Bilirubin Urine NEGATIVE NEGATIVE   Ketones, ur NEGATIVE NEGATIVE mg/dL   Protein, ur NEGATIVE NEGATIVE mg/dL   Nitrite NEGATIVE NEGATIVE   Leukocytes,Ua TRACE (A) NEGATIVE   RBC / HPF >50 0 - 5 RBC/hpf   WBC, UA 11-20 0 - 5 WBC/hpf   Bacteria, UA RARE (A) NONE SEEN   Squamous Epithelial / HPF 6-10 0 - 5 /HPF   Mucus PRESENT   Fetal fibronectin     Status: None   Collection Time: 07/12/22  9:15 PM  Result Value Ref Range   Fetal Fibronectin NEGATIVE NEGATIVE    A/Positive/-- (09/06 1112)  Imaging:    MAU Course/MDM: I have reviewed the triage vital signs and the nursing notes.   Pertinent labs & imaging results that were available during my care of the patient were reviewed by me and considered in my medical decision making (see chart for details).      I have reviewed her medical records including past results, notes and treatments.   I have ordered labs and reviewed results. UA is normal and fetal fibronectin is negative NST reviewed  Treatments in MAU included Procardia x 1 dose (BP too low for second dose) Offered Terbutaline, reviewed side effects, agrees to try Percocet x 1 given for pain  with Phenergan .   Uterine irritability and contractions mostly stopped after this and patient felt much better.  Discussed referral to Pelvic Floor PT (message sent)  Assessment: Single IUP at [redacted]w[redacted]d Uterine irritability Pelvic floor pain, Vulvodynia  Plan: Discharge home Preterm Labor precautions and fetal kick counts Follow up in Office for prenatal visits and recheck Encouraged to return if she develops worsening of symptoms, increase in pain, fever, or other concerning symptoms.   Pt stable at time of discharge.  Hansel Feinstein CNM, MSN Certified Nurse-Midwife 07/12/2022 9:03 PM

## 2022-07-13 ENCOUNTER — Other Ambulatory Visit: Payer: Self-pay | Admitting: *Deleted

## 2022-07-13 ENCOUNTER — Encounter: Payer: Self-pay | Admitting: Student

## 2022-07-13 ENCOUNTER — Ambulatory Visit (INDEPENDENT_AMBULATORY_CARE_PROVIDER_SITE_OTHER): Payer: Medicaid Other | Admitting: Student

## 2022-07-13 ENCOUNTER — Other Ambulatory Visit (HOSPITAL_COMMUNITY)
Admission: RE | Admit: 2022-07-13 | Discharge: 2022-07-13 | Disposition: A | Discharge status: Home / Self Care | Payer: Medicaid Other | Source: Ambulatory Visit | Attending: Student | Admitting: Student

## 2022-07-13 VITALS — BP 127/73 | HR 92 | Wt 214.6 lb

## 2022-07-13 DIAGNOSIS — O99213 Obesity complicating pregnancy, third trimester: Secondary | ICD-10-CM

## 2022-07-13 DIAGNOSIS — N94819 Vulvodynia, unspecified: Secondary | ICD-10-CM | POA: Diagnosis present

## 2022-07-13 DIAGNOSIS — O42919 Preterm premature rupture of membranes, unspecified as to length of time between rupture and onset of labor, unspecified trimester: Secondary | ICD-10-CM

## 2022-07-13 DIAGNOSIS — F1290 Cannabis use, unspecified, uncomplicated: Secondary | ICD-10-CM

## 2022-07-13 DIAGNOSIS — R102 Pelvic and perineal pain: Secondary | ICD-10-CM

## 2022-07-13 DIAGNOSIS — Z3480 Encounter for supervision of other normal pregnancy, unspecified trimester: Secondary | ICD-10-CM | POA: Diagnosis not present

## 2022-07-13 DIAGNOSIS — Z3A31 31 weeks gestation of pregnancy: Secondary | ICD-10-CM

## 2022-07-13 DIAGNOSIS — Z3689 Encounter for other specified antenatal screening: Secondary | ICD-10-CM

## 2022-07-13 HISTORY — DX: Vulvodynia, unspecified: N94.819

## 2022-07-13 NOTE — Progress Notes (Signed)
Pt presents for ROB. Pt was seen at MAU yesterday for contractions. Pt states contractions stopped, pt was discharged and they return at 5 am. Pt reports contraction "back to back".

## 2022-07-13 NOTE — Progress Notes (Signed)
   PRENATAL VISIT NOTE  Subjective:  Alyssa Howell is a 22 y.o. G2P1001 at [redacted]w[redacted]d being seen today for ongoing prenatal care.  She is currently monitored for the following issues for this low-risk pregnancy and has Marijuana use; Depression, recurrent ([place]); GERD (gastroesophageal reflux disease); Vaginal pain; Breast tenderness in female; Gonorrhea affecting pregnancy; Supervision of other normal pregnancy, antepartum; and Vulvodynia, unspecified on their problem list.  Patient reports constant 6-10 vaginal pain . Was seen in MAU yesterday and completed F/U US yesterday as well. Contractions: Irregular. Vag. Bleeding: None.  Movement: Present. Denies leaking of fluid.   The following portions of the patient's history were reviewed and updated as appropriate: allergies, current medications, past family history, past medical history, past social history, past surgical history and problem list.   Objective:   Vitals:   07/13/22 1045  BP: 127/73  Pulse: 92  Weight: 214 lb 9.6 oz (97.3 kg)    Fetal Status: Fetal Heart Rate (bpm): 150   Movement: Present     General:  Alert, oriented and cooperative. Patient is in no acute distress.  Skin: Skin is warm and dry. No rash noted.   Cardiovascular: Normal heart rate noted  Respiratory: Normal respiratory effort, no problems with respiration noted  Abdomen: Soft, gravid, appropriate for gestational age.  Pain/Pressure: Present     Pelvic: Cervical exam deferred        Extremities: Normal range of motion.  Edema: None  Mental Status: Normal mood and affect. Normal behavior. Normal judgment and thought content.   Assessment and Plan:  Pregnancy: G2P1001 at [redacted]w[redacted]d 1. Supervision of other normal pregnancy, antepartum - Frequent and vigorous fetal movement - NST reactive at 31 weeks  2. [redacted] weeks gestation of pregnancy - continue routine care  3. Obesity affecting pregnancy in third trimester, unspecified obesity type - follow-up growth  ultrasound has been scheduled  4. Pelvic pain 5. Vaginal pain - advised to continue pharmacological therapy as prescribed. Incorporate pregnancy support  belt. Plan for follow-up with PT. Encouraged patient to report back to MAU should her pain worsen. - Culture, OB Urine - Cervicovaginal ancillary only  Preterm labor symptoms and general obstetric precautions including but not limited to vaginal bleeding, contractions, leaking of fluid and fetal movement were reviewed in detail with the patient. Please refer to After Visit Summary for other counseling recommendations.   No follow-ups on file.  Future Appointments  Date Time Provider [place]  07/27/2022 10:35 AM Johnston Ebbs, NP [place] None  08/09/2022  8:30 AM [facility] NURSE [facility]  08/09/2022  8:45 AM [facility] US6 [facility]  08/10/2022 10:35 AM Laury Deep, CNM [facility] None  08/17/2022 10:55 AM Johnston Ebbs, NP [facility] None    Johnston Ebbs, NP

## 2022-07-14 ENCOUNTER — Telehealth: Payer: Self-pay | Admitting: Student

## 2022-07-14 LAB — CERVICOVAGINAL ANCILLARY ONLY
Bacterial Vaginitis (gardnerella): NEGATIVE
Candida Glabrata: NEGATIVE
Candida Vaginitis: NEGATIVE
Chlamydia: NEGATIVE
Comment: NEGATIVE
Comment: NEGATIVE
Comment: NEGATIVE
Comment: NEGATIVE
Comment: NEGATIVE
Comment: NORMAL
Neisseria Gonorrhea: NEGATIVE
Trichomonas: NEGATIVE

## 2022-07-14 NOTE — Telephone Encounter (Signed)
-----   Message from Johnston Ebbs, NP sent at 07/13/2022 12:36 PM EDT ----- Regarding: Call patient

## 2022-07-14 NOTE — Telephone Encounter (Signed)
Patient answered and provider verified patient.  Patient reports continued "pelvic" pain, but not worsening. States that discomfort is manageable for now.  Reviewed when patient would need to seek urgent medical attention prior to next routine visit. Patient verbalized understanding and declined any further needs.  Johnston Ebbs, [facility], IBCLC

## 2022-07-15 LAB — CULTURE, OB URINE: Culture: >=100000 — AB

## 2022-07-17 ENCOUNTER — Other Ambulatory Visit: Payer: Self-pay | Admitting: *Deleted

## 2022-07-17 ENCOUNTER — Other Ambulatory Visit: Payer: Self-pay | Admitting: Advanced Practice Midwife

## 2022-07-17 DIAGNOSIS — O2343 Unspecified infection of urinary tract in pregnancy, third trimester: Secondary | ICD-10-CM

## 2022-07-17 MED ORDER — CLINDAMYCIN HCL 300 MG PO CAPS: 300.0000 mg | ORAL_CAPSULE | Freq: Three times a day (TID) | ORAL | 0 refills | Status: AC

## 2022-07-17 MED ORDER — CEFADROXIL 500 MG PO CAPS: 500.0000 mg | ORAL_CAPSULE | Freq: Two times a day (BID) | ORAL | 0 refills | Status: AC

## 2022-07-17 NOTE — Progress Notes (Signed)
Changed antibiotic from Duracef to Clindamycin per sensitivities

## 2022-07-17 NOTE — Progress Notes (Signed)
UTI +staph epid Rx Duracef  No answer when I called WIll have office call her

## 2022-07-21 ENCOUNTER — Other Ambulatory Visit: Payer: Self-pay | Admitting: Student

## 2022-07-21 DIAGNOSIS — Z3480 Encounter for supervision of other normal pregnancy, unspecified trimester: Secondary | ICD-10-CM

## 2022-07-21 DIAGNOSIS — M5431 Sciatica, right side: Secondary | ICD-10-CM

## 2022-07-21 DIAGNOSIS — R102 Pelvic and perineal pain: Secondary | ICD-10-CM

## 2022-07-27 ENCOUNTER — Ambulatory Visit (INDEPENDENT_AMBULATORY_CARE_PROVIDER_SITE_OTHER): Payer: Medicaid Other | Admitting: Student

## 2022-07-27 ENCOUNTER — Encounter: Payer: Self-pay | Admitting: Student

## 2022-07-27 VITALS — BP 118/80 | HR 102 | Wt 219.6 lb

## 2022-07-27 DIAGNOSIS — Z3480 Encounter for supervision of other normal pregnancy, unspecified trimester: Secondary | ICD-10-CM

## 2022-07-27 DIAGNOSIS — M5431 Sciatica, right side: Secondary | ICD-10-CM

## 2022-07-27 DIAGNOSIS — Z3A33 33 weeks gestation of pregnancy: Secondary | ICD-10-CM

## 2022-07-27 DIAGNOSIS — N94819 Vulvodynia, unspecified: Secondary | ICD-10-CM

## 2022-07-27 DIAGNOSIS — O99213 Obesity complicating pregnancy, third trimester: Secondary | ICD-10-CM

## 2022-07-27 DIAGNOSIS — R102 Pelvic and perineal pain: Secondary | ICD-10-CM

## 2022-07-27 NOTE — Progress Notes (Signed)
Pt presents for ROB visit. No concerns at this time.  

## 2022-07-27 NOTE — Progress Notes (Signed)
   PRENATAL VISIT NOTE  Subjective:  Alyssa Howell is a 22 y.o. G2P1001 at [redacted]w[redacted]d being seen today for ongoing prenatal care.  She is currently monitored for the following issues for this low-risk pregnancy and has Marijuana use; Depression, recurrent; GERD (gastroesophageal reflux disease); UTI in pregnancy; Vaginal pain; Breast tenderness in female; Gonorrhea affecting pregnancy; Supervision of other normal pregnancy, antepartum; and Vulvodynia, unspecified on their problem list.  Patient reports  continued pelvic pain and sciatic nerve pain . Reports this discomfort is not worsening, but patient is eager to start Pelvic Floor PT.   Contractions: Irritability. Vag. Bleeding: None.  Movement: Present. Denies leaking of fluid.   The following portions of the patient's history were reviewed and updated as appropriate: allergies, current medications, past family history, past medical history, past social history, past surgical history and problem list.   Objective:   Vitals:   07/27/22 1031  BP: 118/80  Pulse: (!) 102  Weight: 219 lb 9.6 oz (99.6 kg)    Fetal Status: Fetal Heart Rate (bpm): 145   Movement: Present     General:  Alert, oriented and cooperative. Patient is in no acute distress.  Skin: Skin is warm and dry. No rash noted.   Cardiovascular: Normal heart rate noted  Respiratory: Normal respiratory effort, no problems with respiration noted  Abdomen: Soft, gravid, appropriate for gestational age.  Pain/Pressure: Present     Pelvic: Cervical exam deferred        Extremities: Normal range of motion.  Edema: Trace  Mental Status: Normal mood and affect. Normal behavior. Normal judgment and thought content.   Assessment and Plan:  Pregnancy: G2P1001 at [redacted]w[redacted]d 1. Supervision of other normal pregnancy, antepartum - frequent and vigorous fetal movement  2. [redacted] weeks gestation of pregnancy - continue routine care  3. Pelvic pain 4. Vaginal pain 5. Sciatic pain, right 6.  Vulvodynia, unspecified - TID Flexeril - PT referral has already been placed  7. Obesity affecting pregnancy in third trimester, unspecified obesity type - f/u growth scan scheduled 04/17   Preterm labor symptoms and general obstetric precautions including but not limited to vaginal bleeding, contractions, leaking of fluid and fetal movement were reviewed in detail with the patient. Please refer to After Visit Summary for other counseling recommendations.   Return in about 2 weeks (around 08/10/2022) for LOB, IN-PERSON.  Future Appointments  Date Time Provider [place]  08/09/2022  8:30 AM [facility] NURSE [facility]  08/09/2022  8:45 AM [facility] US6 [facility]  08/10/2022 10:35 AM Laury Deep, CNM [facility] None  08/17/2022 10:55 AM Johnston Ebbs, NP [facility] None    Johnston Ebbs, NP

## 2022-07-31 ENCOUNTER — Other Ambulatory Visit: Payer: Self-pay

## 2022-07-31 ENCOUNTER — Ambulatory Visit: Payer: Medicaid Other | Attending: Student | Admitting: Physical Therapy

## 2022-07-31 ENCOUNTER — Other Ambulatory Visit: Payer: Self-pay | Admitting: Advanced Practice Midwife

## 2022-07-31 DIAGNOSIS — Z3A34 34 weeks gestation of pregnancy: Secondary | ICD-10-CM | POA: Insufficient documentation

## 2022-07-31 DIAGNOSIS — O99891 Other specified diseases and conditions complicating pregnancy: Secondary | ICD-10-CM

## 2022-07-31 DIAGNOSIS — R102 Pelvic and perineal pain: Secondary | ICD-10-CM | POA: Insufficient documentation

## 2022-07-31 DIAGNOSIS — M62838 Other muscle spasm: Secondary | ICD-10-CM | POA: Insufficient documentation

## 2022-07-31 DIAGNOSIS — O26893 Other specified pregnancy related conditions, third trimester: Secondary | ICD-10-CM | POA: Insufficient documentation

## 2022-07-31 DIAGNOSIS — M5431 Sciatica, right side: Secondary | ICD-10-CM | POA: Insufficient documentation

## 2022-07-31 DIAGNOSIS — M6281 Muscle weakness (generalized): Secondary | ICD-10-CM | POA: Diagnosis not present

## 2022-07-31 DIAGNOSIS — M5459 Other low back pain: Secondary | ICD-10-CM | POA: Insufficient documentation

## 2022-07-31 DIAGNOSIS — Z3480 Encounter for supervision of other normal pregnancy, unspecified trimester: Secondary | ICD-10-CM | POA: Insufficient documentation

## 2022-07-31 NOTE — Therapy (Addendum)
OUTPATIENT PHYSICAL THERAPY FEMALE PELVIC EVALUATION   Patient Name: Alyssa Howell MRN: [id number] DOB:2000-11-25, 22 y.o., female Today's Date: 07/31/2022  END OF SESSION:  PT End of Session - 07/31/22 1501     Visit Number 1    Date for PT Re-Evaluation 09/11/22    Authorization Type medicaid    PT Start Time 1450    PT Stop Time 1526    PT Time Calculation (min) 36 min    Activity Tolerance Patient tolerated treatment well    Behavior During Therapy WFL for tasks assessed/performed             Past Medical History:  Diagnosis Date   ADHD    Depression    Kidney stones    Seizures    reaction to medication only   Urinary reflux    V[place]ico-ureteral reflux    Past Surgical History:  Procedure Laterality Date   CYSTOSCOPY/URETEROSCOPY/HOLMIUM LASER/STENT PLACEMENT Right 08/02/2020   Procedure: CYSTOSCOPY/URETEROSCOPY/HOLMIUM LASER/STENT PLACEMENT;  Surgeon: Vanna Scotland, MD;  Location: [facility] ORS;  Service: Urology;  Laterality: Right;   kidney stones     TONSILLECTOMY     Patient Active Problem List   Diagnosis Date Noted   Vulvodynia, 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   Supervision of other normal pregnancy, antepartum 05/18/2022   Gonorrhea affecting pregnancy 02/22/2022   Vaginal pain 07/07/2021   Breast tenderness in female 07/07/2021   UTI in pregnancy 03/07/2021   GERD (gastroesophageal reflux disease) 10/09/2019   Marijuana use 02/24/2019   Depression, recurrent 02/24/2019    PCP:    Westley Chandler, MD    REFERRING PROVIDER: Corlis Hove, NP   REFERRING DIAG:  [phone] (ICD-10-CM) - Supervision of other normal pregnancy, antepartum  R10.2 (ICD-10-CM) - Pelvic pain  R10.2 (ICD-10-CM) - Vaginal pain  M54.31 (ICD-10-CM) - Sciatic pain, right    THERAPY DIAG:  Other muscle spasm  Muscle weakness (generalized)  Other low back pain  Rationale for Evaluation and Treatment: Rehabilitation  ONSET DATE: whole pregnancy  SUBJECTIVE:                                                                                                                                                                                            SUBJECTIVE STATEMENT: Pt is [redacted] weeks pregnant currently.  Pt has a 21 year old and caring for him.  Pt has to move slowly and carefully due to sharp shooting pains. Fluid intake: Yes:      PAIN:  Are you having pain? Yes NPRS scale: 3-4/10, but up to 10/10 crying when trying to sleep Pain location: Right, Anterior, and Posterior  Pain type:  dull and sharp; can be even painful to mid thoracic Pain description: intermittent and constant   Aggravating factors: turning a certain way and lying down Relieving factors: heat and moving slowly, sometimes have to sit up to sleep  PRECAUTIONS: Other: [redacted] weeks gestation  WEIGHT BEARING RESTRICTIONS: No  FALLS:  Has patient fallen in last 6 months? No  LIVING ENVIRONMENT: Lives with:  70 year old son Lives in: House/apartment   OCCUPATION: not employed  PLOF: Independent  PATIENT GOALS: get rid   PERTINENT HISTORY:  1 previous vaginal delivery Sexual abuse: No  BOWEL MOVEMENT: Pain with bowel movement: No Type of bowel movement:Type (Bristol Stool Scale) fine with mirilax, Frequency normal, and Strain No Fully empty rectum: Yes:   Leakage: No Pads:  Fiber supplement:   URINATION: Pain with urination: No Fully empty bladder: Yes:   Stream:  sometimes small amount Urgency: Yes: pregnancy related Frequency: every time I get up Leakage:  No Pads: No  INTERCOURSE: Pain with intercourse: Initial Penetration, During Penetration, and Pain Interrupts Intercourse Ability to have vaginal penetration:  No Climax:  Marinoff Scale: 3/3  PREGNANCY: Vaginal deliveries 1 Tearing No  Currently pregnant Yes: 34 weeks  PROLAPSE: None   OBJECTIVE:     PATIENT SURVEYS:    PFIQ-7   COGNITION: Overall cognitive status: Within functional limits for tasks  assessed     SENSATION: Light touch:  Proprioception: Appears intact  MUSCLE LENGTH: Hamstrings: Right 60 deg; Left 70 deg Thomas test:   LUMBAR SPECIAL TESTS:    FUNCTIONAL TESTS:  Single leg weight shift bil  GAIT:  Comments: WFL   POSTURE: rounded shoulders, right pelvic obliquity, weight shift left, and doesn't straighten right leg completely due to pain  PELVIC ALIGNMENT: Rt anterior rotation  LUMBARAROM/PROM:  A/PROM A/PROM  eval  Flexion 50%  Extension   Right lateral flexion   Left lateral flexion   Right rotation   Left rotation    (Blank rows = not tested)  LOWER EXTREMITY MMT:  MMT Right eval Left eval  Hip flexion 3/5 (pain) 5  Hip extension    Hip abduction 4/5 (sitting) 5  Hip adduction 3/5 (pain) 5  Hip internal rotation 4/5 5  Hip external rotation 3/5 (pain) 5  Knee flexion    Knee extension    Ankle dorsiflexion    Ankle plantarflexion    Ankle inversion    Ankle eversion     (Blank rows = not tested)  LOWER EXTREMITY ROM:  Passive Right eval Left eval  Hip flexion    Hip extension    Hip abduction    Hip adduction    Hip internal rotation    Hip external rotation    Knee flexion    Knee extension    Ankle dorsiflexion    Ankle plantarflexion    Ankle inversion    Ankle eversion     PALPATION:   General  SI joint TTP right, adductors,                 External Perineal Exam - tender and guarded ischio and bulbo cavernosis tender and tight on Rt, mild on Lt, adductors attachment tender on - able to bulge                              Internal Pelvic Floor unable to assess due to severe pain with palpation  Patient confirms identification and approves PT  to assess internal pelvic floor and treatment Yes  PELVIC MMT:   MMT eval  Vaginal deferred  Internal Anal Sphincter   External Anal Sphincter   Puborectalis   Diastasis Recti   (Blank rows = not tested)        TONE: high  PROLAPSE: None noted with  bulge  TODAY'S TREATMENT:                                                                                                                              DATE: 07/31/22  EVAL and educated on diaphragmatic breathing, leaning on table and cat cow Check all possible CPT codes: [id number] - Self Care    Check all conditions that are expected to impact treatment: Musculoskeletal disorders and Current pregnancy or recent postpartum   If treatment provided at initial evaluation, no treatment charged due to lack of authorization.        PATIENT EDUCATION:  Education details: Access Code: [email or web address] Person educated: Patient Education method: Explanation, Demonstration, Tactile cues, Verbal cues, and Handouts Education comprehension: verbalized understanding and returned demonstration  HOME EXERCISE PROGRAM: Access Code: [email or web address] URL: [email or web address] Date: 07/31/2022 Prepared by: Dwana CurdJacqueline Normon Pettijohn  Exercises - Supine Diaphragmatic Breathing  - 3 x daily - 7 x weekly - 1 sets - 10 reps - Supported Teacher, musicButterfly Stretch with Pelvic Floor Relaxation  - 1 x daily - 7 x weekly - 3 sets - 10 reps  ASSESSMENT:  CLINICAL IMPRESSION: Patient is a 22 y.o. 6934 weeks pregnant female who was seen today for physical therapy evaluation and treatment for pelvic and sciatic pain.  Pt has very tight and tender Rt adductors and bil external pelvic floor fascia and superficial muscle layer as well as SI joint pain.  Pt has Rt anterior hip rotation.  Pt has weakness in core demonstrated with instability of pelvis during hip flexion.  Pt has weakness and pain with MMT on Rt and mild weakness in left hip.  Pt has posture abnormalities as mentioned above. Pt will benefit from skilled PT to address all above mentioned impairments for improved function during pregnancy and improved outcomes postpartum.  OBJECTIVE IMPAIRMENTS: decreased coordination, decreased ROM, decreased strength, increased muscle spasms,  impaired flexibility, impaired tone, postural dysfunction, and pain.   ACTIVITY LIMITATIONS: sitting, standing, toileting, and caring for others  PARTICIPATION LIMITATIONS: cleaning, interpersonal relationship, and community activity  PERSONAL FACTORS: 3+ comorbidities: second pregnancy 5 months after first, currently third trimester, vaginal deliveryx1  are also affecting patient's functional outcome.   REHAB POTENTIAL: Excellent  CLINICAL DECISION MAKING: Evolving/moderate complexity  EVALUATION COMPLEXITY: Moderate   GOALS: Goals reviewed with patient? Yes  SHORT TERM GOALS: Target date: 08/28/22  Pt will be ind with initial HEP Baseline: Goal status: INITIAL  2.  Pt will report 20% less pain during the day Baseline:  Goal status: INITIAL   LONG TERM GOALS: Target date: 09/11/22  Pt will be independent with advanced HEP to maintain  improvements made throughout therapy  Baseline: does not know Goal status: INITIAL  2.  Pt will report 65% reduction of pain due to improvements in posture, strength, and muscle length  Baseline: 10/10 when trying to sleep (crying because of pain) Goal status: INITIAL  3.  Pt will have 1/3 score of Marinoff scale  Baseline: 3/3 Goal status: INITIAL  4.  Pt will have knowledge of active labor positions and be able to practice at least one stretch for each of the 3 stages of delivery Baseline: does not know Goal status: INITIAL   PLAN:  PT FREQUENCY: 2x/week  PT DURATION: 6 weeks  PLANNED INTERVENTIONS: Therapeutic exercises, Therapeutic activity, Neuromuscular re-education, Balance training, Gait training, Patient/Family education, Self Care, Joint mobilization, Aquatic Therapy, Dry Needling, Electrical stimulation, Cryotherapy, Moist heat, Taping, Biofeedback, Manual therapy, and Re-evaluation  PLAN FOR NEXT SESSION: talk about aquatic, soft tissue, cupping lumbar and thoracic, addaday gluteals, pain management and gentle core  strengthening   Junious Silk, PT 07/31/2022, 5:18 PM  PHYSICAL THERAPY DISCHARGE SUMMARY  Visits from Start of Care: 1  Current functional level related to goals / functional outcomes: See above goals   Remaining deficits: See above   Education / Equipment: POC   Patient agrees to discharge. Patient goals were not met. Patient is being discharged due to not returning since the last visit.  Russella Dar, PT, DPT 10/05/22 12:24 PM

## 2022-08-04 ENCOUNTER — Encounter (HOSPITAL_COMMUNITY): Payer: Self-pay | Admitting: Obstetrics and Gynecology

## 2022-08-04 ENCOUNTER — Other Ambulatory Visit: Payer: Self-pay

## 2022-08-04 ENCOUNTER — Telehealth: Payer: Medicaid Other | Admitting: Physician Assistant

## 2022-08-04 ENCOUNTER — Inpatient Hospital Stay (HOSPITAL_COMMUNITY)
Admission: AD | Admit: 2022-08-04 | Discharge: 2022-08-07 | DRG: 807 | Disposition: A | Discharge status: Home / Self Care | Payer: Medicaid Other | Attending: Obstetrics & Gynecology | Admitting: Obstetrics & Gynecology

## 2022-08-04 DIAGNOSIS — D649 Anemia, unspecified: Secondary | ICD-10-CM | POA: Diagnosis not present

## 2022-08-04 DIAGNOSIS — O98213 Gonorrhea complicating pregnancy, third trimester: Principal | ICD-10-CM

## 2022-08-04 DIAGNOSIS — O99214 Obesity complicating childbirth: Secondary | ICD-10-CM | POA: Diagnosis present

## 2022-08-04 DIAGNOSIS — Z7982 Long term (current) use of aspirin: Secondary | ICD-10-CM | POA: Diagnosis not present

## 2022-08-04 DIAGNOSIS — Z87442 Personal history of urinary calculi: Secondary | ICD-10-CM

## 2022-08-04 DIAGNOSIS — O99324 Drug use complicating childbirth: Secondary | ICD-10-CM | POA: Diagnosis not present

## 2022-08-04 DIAGNOSIS — O42013 Preterm premature rupture of membranes, onset of labor within 24 hours of rupture, third trimester: Secondary | ICD-10-CM | POA: Diagnosis not present

## 2022-08-04 DIAGNOSIS — F1290 Cannabis use, unspecified, uncomplicated: Secondary | ICD-10-CM | POA: Diagnosis present

## 2022-08-04 DIAGNOSIS — Z3480 Encounter for supervision of other normal pregnancy, unspecified trimester: Secondary | ICD-10-CM

## 2022-08-04 DIAGNOSIS — Z3A34 34 weeks gestation of pregnancy: Secondary | ICD-10-CM

## 2022-08-04 DIAGNOSIS — O26899 Other specified pregnancy related conditions, unspecified trimester: Secondary | ICD-10-CM

## 2022-08-04 DIAGNOSIS — O42913 Preterm premature rupture of membranes, unspecified as to length of time between rupture and onset of labor, third trimester: Secondary | ICD-10-CM | POA: Diagnosis present

## 2022-08-04 DIAGNOSIS — O9902 Anemia complicating childbirth: Secondary | ICD-10-CM | POA: Diagnosis not present

## 2022-08-04 DIAGNOSIS — F339 Major depressive disorder, recurrent, unspecified: Secondary | ICD-10-CM | POA: Diagnosis present

## 2022-08-04 DIAGNOSIS — O42919 Preterm premature rupture of membranes, unspecified as to length of time between rupture and onset of labor, unspecified trimester: Secondary | ICD-10-CM | POA: Diagnosis present

## 2022-08-04 DIAGNOSIS — O43893 Other placental disorders, third trimester: Secondary | ICD-10-CM | POA: Diagnosis not present

## 2022-08-04 LAB — WET PREP, GENITAL
Clue Cells Wet Prep HPF POC: NONE SEEN
Sperm: NONE SEEN
Trich, Wet Prep: NONE SEEN
WBC, Wet Prep HPF POC: <10 (ref <10)
Yeast Wet Prep HPF POC: NONE SEEN

## 2022-08-04 LAB — CBC
HCT: 31.5 % — ABNORMAL LOW (ref 36.0–46.0)
Hemoglobin: 10.5 g/dL — ABNORMAL LOW (ref 12.0–15.0)
MCH: 31.8 pg (ref 26.0–34.0)
MCHC: 33.3 g/dL (ref 30.0–36.0)
MCV: 95.5 fL (ref 80.0–100.0)
Platelets: 263 10*3/uL (ref 150–400)
RBC: 3.3 MIL/uL — ABNORMAL LOW (ref 3.87–5.11)
RDW: 12.9 % (ref 11.5–15.5)
WBC: 11.5 10*3/uL — ABNORMAL HIGH (ref 4.0–10.5)
nRBC: 0 % (ref 0.0–0.2)

## 2022-08-04 LAB — TYPE AND SCREEN
ABO/RH(D): A POS
Antibody Screen: NEGATIVE

## 2022-08-04 LAB — POCT FERN TEST: POCT Fern Test: POSITIVE

## 2022-08-04 MED ORDER — OXYCODONE-ACETAMINOPHEN 5-325 MG PO TABS: 2.0000 | ORAL_TABLET | ORAL | Status: DC | PRN

## 2022-08-04 MED ORDER — PENICILLIN G POT IN DEXTROSE 60000 UNIT/ML IV SOLN
3.0000 10*6.[IU] | INTRAVENOUS | Status: DC
  Administered 2022-08-05 (×3): 3 10*6.[IU] via INTRAVENOUS
  Filled 2022-08-04 (×5): qty 50

## 2022-08-04 MED ORDER — MISOPROSTOL 25 MCG QUARTER TABLET
25.0000 ug | ORAL_TABLET | Freq: Once | ORAL | Status: AC
  Administered 2022-08-04: 25 ug via VAGINAL
  Filled 2022-08-04: qty 1

## 2022-08-04 MED ORDER — LACTATED RINGERS IV SOLN: INTRAVENOUS | Status: DC

## 2022-08-04 MED ORDER — ACETAMINOPHEN 325 MG PO TABS
650.0000 mg | ORAL_TABLET | ORAL | Status: DC | PRN
  Administered 2022-08-05: 650 mg via ORAL
  Filled 2022-08-04: qty 2

## 2022-08-04 MED ORDER — OXYCODONE-ACETAMINOPHEN 5-325 MG PO TABS: 1.0000 | ORAL_TABLET | ORAL | Status: DC | PRN

## 2022-08-04 MED ORDER — FENTANYL CITRATE (PF) 100 MCG/2ML IJ SOLN
50.0000 ug | INTRAMUSCULAR | Status: DC | PRN
  Administered 2022-08-04 – 2022-08-05 (×2): 50 ug via INTRAVENOUS
  Filled 2022-08-04 (×2): qty 2

## 2022-08-04 MED ORDER — TERBUTALINE SULFATE 1 MG/ML IJ SOLN: 0.2500 mg | Freq: Once | INTRAMUSCULAR | Status: DC | PRN

## 2022-08-04 MED ORDER — SOD CITRATE-CITRIC ACID 500-334 MG/5ML PO SOLN: 30.0000 mL | ORAL | Status: DC | PRN

## 2022-08-04 MED ORDER — SODIUM CHLORIDE 0.9 % IV SOLN
5.0000 10*6.[IU] | Freq: Once | INTRAVENOUS | Status: AC
  Administered 2022-08-04: 5 10*6.[IU] via INTRAVENOUS
  Filled 2022-08-04: qty 5

## 2022-08-04 MED ORDER — ONDANSETRON HCL 4 MG/2ML IJ SOLN
4.0000 mg | Freq: Four times a day (QID) | INTRAMUSCULAR | Status: DC | PRN
  Administered 2022-08-05: 4 mg via INTRAVENOUS
  Filled 2022-08-04 (×2): qty 2

## 2022-08-04 MED ORDER — LIDOCAINE HCL (PF) 1 % IJ SOLN: 30.0000 mL | INTRAMUSCULAR | Status: DC | PRN

## 2022-08-04 MED ORDER — MISOPROSTOL 50MCG HALF TABLET
50.0000 ug | ORAL_TABLET | Freq: Once | ORAL | Status: AC
  Administered 2022-08-04: 50 ug via ORAL
  Filled 2022-08-04: qty 1

## 2022-08-04 MED ORDER — LACTATED RINGERS IV SOLN: 500.0000 mL | INTRAVENOUS | Status: DC | PRN

## 2022-08-04 MED ORDER — OXYTOCIN BOLUS FROM INFUSION
333.0000 mL | Freq: Once | INTRAVENOUS | Status: AC
  Administered 2022-08-05: 333 mL via INTRAVENOUS

## 2022-08-04 MED ORDER — OXYTOCIN-SODIUM CHLORIDE 30-0.9 UT/500ML-% IV SOLN
2.5000 [IU]/h | INTRAVENOUS | Status: DC
  Administered 2022-08-05: 2.5 [IU]/h via INTRAVENOUS

## 2022-08-04 NOTE — MAU Note (Signed)
Pt informed that the ultrasound is considered a limited OB ultrasound and is not intended to be a complete ultrasound exam.  Patient also informed that the ultrasound is not being completed with the intent of assessing for fetal or placental anomalies or any pelvic abnormalities.  Explained that the purpose of today's ultrasound is to assess for presentation.  Patient acknowledges the purpose of the exam and the limitations of the study.    Vertex presentation confirmed 

## 2022-08-04 NOTE — Progress Notes (Signed)
Labor Progress Note Alyssa Howell is a 22 y.o. G2P1001 at [redacted]w[redacted]d presented for PROM  S: No acute concerns.   O:  BP 139/82   Pulse (!) 103   Temp 98.7 F (37.1 C) (Oral)   Resp 16   Ht 5\' 5"  (1.651 m)   Wt 97.9 kg   LMP 10/22/2021 (Exact Date)   SpO2 99%   BMI 35.91 kg/m  EFM: 135bpm/moderate/+accels, no decels  CVE: Dilation: 1.5 Effacement (%): 50   A&P: 22 y.o. G2P1001 [redacted]w[redacted]d here for PROM.  #Labor: FB placed with dual 50/25 cytotec.  #Pain: Planning for epidural #FWB: Cat I  #GBS  unk, PCN ppx  [name], DO 9:34 PM

## 2022-08-04 NOTE — MAU Note (Signed)
...  Alyssa Howell is a 22 y.o. at [redacted]w[redacted]d here in MAU reporting: LOF since 0100 this morning. She reports she had intercourse last night. She reports around 0930 she got up and used the restroom and then went and sat on the couch and she had another gush of fluids and it went through her pants and onto the couch. She reports the fluid was clear and odorless. Endorses occasional CTX since 1230. DFM since waking up this morning.  Onset of complaint: 0100 Pain score: 5/10 lower abdomen  FHT: 152 initial external Lab orders placed from triage:  Crist Fat

## 2022-08-04 NOTE — Progress Notes (Signed)
Based on what you shared with me, I feel your condition warrants further evaluation as soon as possible at an Emergency department.    NOTE: There will be NO CHARGE for this eVisit   If you are having a true medical emergency please call 911.      Emergency Department-[facility]  Get Driving Directions  [phone]  [address]  [place], [place] [address]  Open 24/7/365      [facility] [facility] [address]  Get Driving Directions  [address]  [place], [place] [address]  Open 24/7/365    Emergency Department- [facility]  Get Driving Directions  [phone]  [address]  [place], [place] [address]  Open 24/7/365      [facility]  Get Driving Directions  [phone]  [address]  [place], [place] [address]  Open 24/7/365    [place]  Emergency Department- [facility]  Get Driving Directions  [phone]  [address]  [place], [place] [address]  Open 24/7/365    [place]  Emergency Department-  [facility]  Get Driving Directions  [address]  [place], [place] [address]  Open 24/7/365    [place]  Emergency Department-  [facility]  Get Driving Directions  [phone]  [address]  [place], [place] [address]  Open 24/7/365    I have spent 5 minutes in review of e-visit questionnaire, review and updating patient chart, medical decision making and response to patient.   Sherlock Nancarrow M Tyran Huser, PA-C  

## 2022-08-04 NOTE — H&P (Signed)
Obstetric History and Physical  Alyssa Howell is a 22 y.o. G2P1001 with IUP at [redacted]w[redacted]d presenting for PPROM. Has had several gushes of clear fluid since 1 am this morning. Denies regular contractions, vaginal bleeding, or fever. Good fetal movement.   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   Prenatal labs and studies: ABO, Rh: A/Positive/-- (09/06 1112) Antibody: Negative (09/06 1112) Rubella: >33.00 (09/06 1112) RPR: Non Reactive (02/22 0838)  HBsAg: Negative (09/06 1112)  HIV: Non Reactive (02/22 0838)  GBS: pending 2hr GTT:  normal (74/120/54) Genetic screening normal Anatomy US normal  Prenatal Transfer Tool  Maternal Diabetes: No Genetic Screening: Normal Maternal Ultrasounds/Referrals: Normal Fetal Ultrasounds or other Referrals:  None Maternal Substance Abuse:  No Significant Maternal Medications:  None Significant Maternal Lab Results: None  Past Medical History:  Diagnosis Date   ADHD    Depression    Kidney stones    Seizures    x 1 in 5th grade after taking ability. No other episodes.   Vesico-ureteral reflux     Past Surgical History:  Procedure Laterality Date   CYSTOSCOPY/URETEROSCOPY/HOLMIUM LASER/STENT PLACEMENT Right 08/02/2020   Procedure: CYSTOSCOPY/URETEROSCOPY/HOLMIUM LASER/STENT PLACEMENT;  Surgeon: Vanna Scotland, MD;  Location: [facility] ORS;  Service: Urology;  Laterality: Right;   TONSILLECTOMY      OB History  Gravida Para Term Preterm AB Living  SAB IAB Ectopic Multiple Live Births          1    # Outcome Date GA Lbr Len/2nd Weight Sex Delivery Anes PTL Lv  2 Current           1 Term  [redacted]w[redacted]d   M Vag-Spont   LIV    Social History   Socioeconomic History   Marital status: Single    Spouse name: Not on file   Number of children: Not on file   Years of education: Not on file   Highest education level: Not on file  Occupational History   Not on file  Tobacco Use   Smoking status: Never   Smokeless tobacco: Never  Vaping Use   Vaping Use: Never used  Substance and Sexual Activity   Alcohol use: Not Currently   Drug use: Not Currently    Types: Marijuana    Comment: last used the end of September 2022 as of 02/07/2021   Sexual activity: Not Currently    Partners: Male  Other Topics Concern   Not on file  Social History Narrative   From age [age] the patient lived with adoptive parents, grandparents and foster families.  At age 47 her mom gain custody.  Her name is pronounced [name] .  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  She is not sure what she wants to do with her life.  Sexually active.  Does not use condoms 7 partners in  2020.   Social Determinants of Health   Financial Resource Strain: Not on file  Food Insecurity: Not on file  Transportation Needs: Not on file  Physical Activity: Not on file  Stress: Not on file  Social Connections: Not on file    Family History  Problem Relation Age of Onset   Hepatitis C Mother    Valvular heart disease Mother    Other Father        no contact in over 72yrs   Cancer Maternal Grandmother    Breast cancer Maternal Grandmother    Diabetes Other    Heart attack Other    Asthma Neg Hx    Heart disease Neg Hx    Hypertension Neg Hx     Medications Prior to Admission  Medication Sig  Dispense Refill Last Dose   aspirin EC 81 MG tablet Take 1 tablet (81 mg total) by mouth daily. Swallow whole. 30 tablet 12 08/03/2022   Prenatal Vit-Fe Fumarate-FA (PRENATAL MULTIVITAMIN) TABS tablet Take 1 tablet by mouth daily at 12 noon. 90 tablet 3 08/03/2022   ascorbic acid (VITAMIN C) 500 MG tablet Take 1 tablet (500 mg total) by mouth every other day. Take with iron pill (Patient not taking: Reported on 07/12/2022) 30 tablet 3    cyclobenzaprine (FLEXERIL) 5 MG tablet Take 1 tablet (5 mg total) by mouth 3 (three) times daily as needed for up to 30 doses for muscle spasms. 30 tablet 0    diphenhydramine-acetaminophen (TYLENOL PM) 25-500 MG TABS tablet Take 1 tablet by mouth at bedtime as needed.      ferrous sulfate 325 (65 FE) MG EC tablet Take 1 tablet (325 mg total) by mouth every other day. 45 tablet 3    Magnesium Oxide -Mg Supplement (MAG-OXIDE) 200 MG TABS Take 2 tablets (400 mg total) by mouth at bedtime. If that amount causes loose stools in the am, switch to  daily at bedtime. 60 tablet 3     Allergies  Allergen Reactions   Abilify [Aripiprazole] Other (See Comments)    Seizures    Review of Systems: Negative except for what is mentioned in HPI.  Physical Exam: BP 122/76 (BP Location: Right Arm)   Pulse (!) 108   Temp 98.7 F (37.1 C) (Oral)   Resp 15   Ht  (1.651 m)   Wt 97.9 kg   LMP 10/22/2021 (Exact Date)   SpO2 99%   BMI 35.91 kg/m  CONSTITUTIONAL: Well-developed, well-nourished female in no acute distress.  HENT:  Normocephalic, atraumatic, External right and left ear normal. Oropharynx is clear and moist EYES: Conjunctivae and EOM are normal. Pupils are equal, round, and reactive to light. No scleral icterus.  NECK: Normal range of motion, supple, no masses SKIN: Skin is warm and dry. No rash noted. Not diaphoretic. No erythema. No pallor. NEUROLOGIC: Alert and oriented to person, place, and time. Normal reflexes, muscle tone coordination. No cranial  nerve deficit noted. PSYCHIATRIC: Normal mood and affect. Normal behavior. Normal judgment and thought content. CARDIOVASCULAR: Normal heart rate noted, regular rhythm RESPIRATORY: Effort and breath sounds normal, no problems with respiration noted ABDOMEN: Soft, nontender, nondistended, gravid. MUSCULOSKELETAL: Normal range of motion. No edema and no tenderness. 2+ distal pulses.  Cervical Exam:    deferred Presentation: cephalic FHT:  NST:  Baseline: 150 bpm, Variability: Good {> 6 bpm), Accelerations: Reactive, and Decelerations: Absent   Pertinent Labs/Studies:   Results for orders placed or performed during the hospital encounter of  08/04/22 (from the past 24 hour(s))  Fern Test     Status: None   Collection Time: 08/04/22  5:23 PM  Result Value Ref Range   POCT Fern Test Positive = ruptured amniotic membanes   Wet prep, genital     Status: None   Collection Time: 08/04/22  5:37 PM   Specimen: Vaginal  Result Value Ref Range   Yeast Wet Prep HPF POC NONE SEEN NONE SEEN   Trich, Wet Prep NONE SEEN NONE SEEN   Clue Cells Wet Prep HPF POC NONE SEEN NONE SEEN   WBC, Wet Prep HPF POC <10 <10   Sperm NONE SEEN     Assessment : Alyssa Howell is a 22 y.o. G2P1001 at [redacted]w[redacted]d being admitted for augmentation of labor due to PPROM.  Plan: Labor: Augmentation as ordered as per protocol.  Analgesia as needed. FWB: Reassuring fetal heart tracing.   GBS pending, will receive antibiotics per PPROM protocol Delivery plan: Hopeful for vaginal delivery   Judeth Horn, NP  08/04/2022 7:06 PM

## 2022-08-05 ENCOUNTER — Encounter (HOSPITAL_COMMUNITY): Payer: Self-pay | Admitting: Obstetrics and Gynecology

## 2022-08-05 ENCOUNTER — Inpatient Hospital Stay (HOSPITAL_COMMUNITY): Payer: Medicaid Other | Admitting: Anesthesiology

## 2022-08-05 DIAGNOSIS — O99324 Drug use complicating childbirth: Secondary | ICD-10-CM

## 2022-08-05 DIAGNOSIS — O42013 Preterm premature rupture of membranes, onset of labor within 24 hours of rupture, third trimester: Secondary | ICD-10-CM

## 2022-08-05 DIAGNOSIS — Z3A34 34 weeks gestation of pregnancy: Secondary | ICD-10-CM

## 2022-08-05 LAB — RPR: RPR Ser Ql: NONREACTIVE

## 2022-08-05 MED ORDER — SODIUM CHLORIDE 0.9 % IV SOLN: 250.0000 mL | INTRAVENOUS | Status: DC | PRN

## 2022-08-05 MED ORDER — MISOPROSTOL 200 MCG PO TABS
ORAL_TABLET | ORAL | Status: AC
  Filled 2022-08-05: qty 5

## 2022-08-05 MED ORDER — FENTANYL-BUPIVACAINE-NACL 0.5-0.125-0.9 MG/250ML-% EP SOLN
12.0000 mL/h | EPIDURAL | Status: DC | PRN
  Filled 2022-08-05: qty 250

## 2022-08-05 MED ORDER — BENZOCAINE-MENTHOL 20-0.5 % EX AERO
1.0000 | INHALATION_SPRAY | CUTANEOUS | Status: DC | PRN
  Administered 2022-08-07: 1 via TOPICAL
  Filled 2022-08-05: qty 56

## 2022-08-05 MED ORDER — PHENYLEPHRINE 80 MCG/ML (10ML) SYRINGE FOR IV PUSH (FOR BLOOD PRESSURE SUPPORT): 80.0000 ug | PREFILLED_SYRINGE | INTRAVENOUS | Status: DC | PRN

## 2022-08-05 MED ORDER — ACETAMINOPHEN 325 MG PO TABS
650.0000 mg | ORAL_TABLET | ORAL | Status: DC | PRN
  Administered 2022-08-06 – 2022-08-07 (×5): 650 mg via ORAL
  Filled 2022-08-05 (×5): qty 2

## 2022-08-05 MED ORDER — METHYLERGONOVINE MALEATE 0.2 MG/ML IJ SOLN
INTRAMUSCULAR | Status: AC
  Filled 2022-08-05: qty 1

## 2022-08-05 MED ORDER — MISOPROSTOL 200 MCG PO TABS
1000.0000 ug | ORAL_TABLET | Freq: Once | ORAL | Status: AC
  Administered 2022-08-05: 1000 ug via RECTAL

## 2022-08-05 MED ORDER — ONDANSETRON HCL 4 MG/2ML IJ SOLN: 4.0000 mg | INTRAMUSCULAR | Status: DC | PRN

## 2022-08-05 MED ORDER — SODIUM CHLORIDE 0.9 % IV SOLN
12.5000 mg | Freq: Once | INTRAVENOUS | Status: AC
  Administered 2022-08-05: 12.5 mg via INTRAVENOUS
  Filled 2022-08-05: qty 12.5

## 2022-08-05 MED ORDER — TRANEXAMIC ACID-NACL 1000-0.7 MG/100ML-% IV SOLN
INTRAVENOUS | Status: AC
  Filled 2022-08-05: qty 100

## 2022-08-05 MED ORDER — LACTATED RINGERS IV SOLN
500.0000 mL | Freq: Once | INTRAVENOUS | Status: AC
  Administered 2022-08-05: 500 mL via INTRAVENOUS

## 2022-08-05 MED ORDER — DIPHENHYDRAMINE HCL 50 MG/ML IJ SOLN
12.5000 mg | INTRAMUSCULAR | Status: AC | PRN
  Administered 2022-08-05 (×3): 12.5 mg via INTRAVENOUS
  Filled 2022-08-05: qty 1

## 2022-08-05 MED ORDER — METHYLERGONOVINE MALEATE 0.2 MG/ML IJ SOLN
0.2000 mg | Freq: Once | INTRAMUSCULAR | Status: AC
  Administered 2022-08-05: 0.2 mg via INTRAMUSCULAR

## 2022-08-05 MED ORDER — ZOLPIDEM TARTRATE 5 MG PO TABS: 5.0000 mg | ORAL_TABLET | Freq: Every evening | ORAL | Status: DC | PRN

## 2022-08-05 MED ORDER — TERBUTALINE SULFATE 1 MG/ML IJ SOLN: 0.2500 mg | Freq: Once | INTRAMUSCULAR | Status: DC | PRN

## 2022-08-05 MED ORDER — NALBUPHINE HCL 10 MG/ML IJ SOLN: 2.5000 mg | Freq: Once | INTRAMUSCULAR | Status: DC

## 2022-08-05 MED ORDER — ONDANSETRON HCL 4 MG PO TABS: 4.0000 mg | ORAL_TABLET | ORAL | Status: DC | PRN

## 2022-08-05 MED ORDER — DIBUCAINE (PERIANAL) 1 % EX OINT: 1.0000 | TOPICAL_OINTMENT | CUTANEOUS | Status: DC | PRN

## 2022-08-05 MED ORDER — SENNOSIDES-DOCUSATE SODIUM 8.6-50 MG PO TABS
2.0000 | ORAL_TABLET | ORAL | Status: DC
  Administered 2022-08-05 – 2022-08-06 (×2): 2 via ORAL
  Filled 2022-08-05 (×2): qty 2

## 2022-08-05 MED ORDER — SIMETHICONE 80 MG PO CHEW
80.0000 mg | CHEWABLE_TABLET | ORAL | Status: DC | PRN
  Administered 2022-08-06: 80 mg via ORAL
  Filled 2022-08-05: qty 1

## 2022-08-05 MED ORDER — OXYTOCIN-SODIUM CHLORIDE 30-0.9 UT/500ML-% IV SOLN
1.0000 m[IU]/min | INTRAVENOUS | Status: DC
  Administered 2022-08-05: 2 m[IU]/min via INTRAVENOUS
  Filled 2022-08-05: qty 500

## 2022-08-05 MED ORDER — SODIUM CHLORIDE 0.9% FLUSH: 3.0000 mL | INTRAVENOUS | Status: DC | PRN

## 2022-08-05 MED ORDER — FENTANYL-BUPIVACAINE-NACL 0.5-0.125-0.9 MG/250ML-% EP SOLN
EPIDURAL | Status: DC | PRN
  Administered 2022-08-05: 12 mL/h via EPIDURAL

## 2022-08-05 MED ORDER — EPHEDRINE 5 MG/ML INJ: 10.0000 mg | INTRAVENOUS | Status: DC | PRN

## 2022-08-05 MED ORDER — WITCH HAZEL-GLYCERIN EX PADS: 1.0000 | MEDICATED_PAD | CUTANEOUS | Status: DC | PRN

## 2022-08-05 MED ORDER — DIPHENHYDRAMINE HCL 25 MG PO CAPS: 25.0000 mg | ORAL_CAPSULE | Freq: Four times a day (QID) | ORAL | Status: DC | PRN

## 2022-08-05 MED ORDER — PRENATAL MULTIVITAMIN CH
1.0000 | ORAL_TABLET | Freq: Every day | ORAL | Status: DC
  Administered 2022-08-06: 1 via ORAL
  Filled 2022-08-05: qty 1

## 2022-08-05 MED ORDER — TRANEXAMIC ACID-NACL 1000-0.7 MG/100ML-% IV SOLN
1000.0000 mg | INTRAVENOUS | Status: AC
  Administered 2022-08-05: 1000 mg via INTRAVENOUS

## 2022-08-05 MED ORDER — SODIUM CHLORIDE 0.9% FLUSH
3.0000 mL | Freq: Two times a day (BID) | INTRAVENOUS | Status: DC
  Administered 2022-08-05: 3 mL via INTRAVENOUS

## 2022-08-05 MED ORDER — LIDOCAINE HCL (PF) 1 % IJ SOLN
INTRAMUSCULAR | Status: DC | PRN
  Administered 2022-08-05: 10 mL via EPIDURAL
  Administered 2022-08-05: 2 mL via EPIDURAL

## 2022-08-05 MED ORDER — IBUPROFEN 600 MG PO TABS
600.0000 mg | ORAL_TABLET | Freq: Four times a day (QID) | ORAL | Status: DC
  Administered 2022-08-05 – 2022-08-07 (×7): 600 mg via ORAL
  Filled 2022-08-05 (×6): qty 1

## 2022-08-05 MED ORDER — COCONUT OIL OIL
1.0000 | TOPICAL_OIL | Status: DC | PRN
  Administered 2022-08-07: 1 via TOPICAL

## 2022-08-05 NOTE — Anesthesia Procedure Notes (Signed)
Epidural Patient location during procedure: OB Start time: 08/05/2022 5:20 AM End time: 08/05/2022 5:28 AM  Staffing Anesthesiologist: Lannie Fields, DO Performed: anesthesiologist   Preanesthetic Checklist Completed: patient identified, IV checked, risks and benefits discussed, monitors and equipment checked, pre-op evaluation and timeout performed  Epidural Patient position: sitting Prep: DuraPrep and site prepped and draped Patient monitoring: continuous pulse ox, blood pressure, heart rate and cardiac monitor Approach: midline Location: L3-L4 Injection technique: LOR air  Needle:  Needle type: Tuohy  Needle gauge: 17 G Needle length: 9 cm Needle insertion depth: 6 cm Catheter type: closed end flexible Catheter size: 19 Gauge Catheter at skin depth: 11 cm Test dose: negative  Assessment Sensory level: T8 Events: blood not aspirated, no cerebrospinal fluid, injection not painful, no injection resistance, no paresthesia and negative IV test  Additional Notes Patient identified. Risks/Benefits/Options discussed with patient including but not limited to bleeding, infection, nerve damage, paralysis, failed block, incomplete pain control, headache, blood pressure changes, nausea, vomiting, reactions to medication both or allergic, itching and postpartum back pain. Confirmed with bedside nurse the patient's most recent platelet count. Confirmed with patient that they are not currently taking any anticoagulation, have any bleeding history or any family history of bleeding disorders. Patient expressed understanding and wished to proceed. All questions were answered. Sterile technique was used throughout the entire procedure. Please see nursing notes for vital signs. Test dose was given through epidural catheter and negative prior to continuing to dose epidural or start infusion. Warning signs of high block given to the patient including shortness of breath, tingling/numbness in  hands, complete motor block, or any concerning symptoms with instructions to call for help. Patient was given instructions on fall risk and not to get out of bed. All questions and concerns addressed with instructions to call with any issues or inadequate analgesia.  Reason for block:procedure for pain

## 2022-08-05 NOTE — Progress Notes (Signed)
Labor Progress Note  Alyssa Howell is a 22 y.o. G2P1001 at [redacted]w[redacted]d presented for PROM 4/12 at 0100  S: Doing well. Nursing having issues with tracing FHT and toco  O:  BP 107/68   Pulse 80   Temp 97.9 F (36.6 C) (Oral)   Resp 18   Ht 5\' 5"  (1.651 m)   Wt 97.9 kg   LMP 10/22/2021 (Exact Date)   SpO2 98%   BMI 35.91 kg/m  EFM: 135 bpm/Moderate variability/ 15x15 accels/ None decels Toco: q 2-6min  CVE: Dilation: 5 Effacement (%): 50 Station: -1 Presentation: Vertex Exam by:: [place], Resident   A&P: 22 y.o. G2P1001 [redacted]w[redacted]d  here for PROM as above  #Labor: Progressing well. IUPC and FSE placed. Continue pit 2x2 #Pain: Family/Friend support and Epidural #FWB: CAT 1 #GBS not done- PCN ppx  Myrtie Hawk, DO FMOB Fellow, Faculty practice [facility], Center for [facility] 08/05/22  10:44 AM

## 2022-08-05 NOTE — Progress Notes (Signed)
Handoff given to receiving nurse. 

## 2022-08-05 NOTE — Progress Notes (Signed)
Hand off received

## 2022-08-05 NOTE — Progress Notes (Signed)
OB Anesthesia called for epidural placement.

## 2022-08-05 NOTE — Progress Notes (Signed)
Labor Progress Note Alyssa Howell is a 22 y.o. G2P1001 at [redacted]w[redacted]d presented for PROM  S: No acute concerns.   O:  BP 130/83   Pulse 95   Temp 98.7 F (37.1 C) (Oral)   Resp 18   Ht 5\' 5"  (1.651 m)   Wt 97.9 kg   LMP 10/22/2021 (Exact Date)   SpO2 99%   BMI 35.91 kg/m  EFM: 130bpm/moderate/+accels, no decels  CVE: Dilation: 4 Effacement (%): 50 Station: -3 Presentation: Vertex Exam by:: Aura Dials RN   A&P: 22 y.o. G2P1001 [redacted]w[redacted]d here for PROM.  #Labor: s/p FB, cytotec. Contracting every 1-2 mins. Consider starting pitocin if needed pending epidural. #Pain: Planning for epidural #FWB: Cat I  #GBS  unk, PCN ppx  Sumi Lye Autry-Lott, DO 4:55 AM

## 2022-08-05 NOTE — Lactation Note (Signed)
This note was copied from a baby's chart.  NICU Lactation Consultation Note  Patient Name: [name] [id number] Date: 08/05/2022 Age:21 hours  Reason for consult: Initial assessment; NICU baby; Infant < 6lbs; Late-preterm 34-36.6wks  SUBJECTIVE Visited with family of 6 hours old LPI NICU female; Ms. Reason is a P2 and has some experienced breastfeeding and pumping; her first baby was also in NICU for a week. She started pumping this afternoon and already getting small volumes of colostrum, praised her for her efforts. Assisted with hand expression on the L side but no colostrum was noted, reassured patient about the normalcy of these pattern. Reviewed pumping schedule, lactogenesis II and anticipatory guidelines.  OBJECTIVE Infant data: Mother's Current Feeding Choice: -- (NPO)  Infant feeding assessment No data recorded  Maternal data: G2P1102  Vaginal, Spontaneous Hand Expression Comments: no colostrum noted at this time Significant Breast History:: (+) breast changes during the pregnancy Current breast feeding challenges:: NICU admission Previous breastfeeding challenges?: Infant separation (1st baby was also in NICU) Does the patient have breastfeeding experience prior to this delivery?: Yes How long did the patient breastfeed?: 1.5 months Pumping frequency: initiated pumping at 5 hours post-partum Pumped volume: 10 mL Flange Size: 24 Risk factor for low milk supply:: prematurity, infant separation, maternal blood loss of 658 cc.  WIC Program: Yes WIC Referral Sent?: Yes What county?: [place]  ASSESSMENT Infant: Feeding Status: NPO  Maternal: Milk volume: Normal Breast are soft and tissue is highly compressible  INTERVENTIONS/PLAN Interventions: Interventions: Breast feeding basics reviewed; Breast massage; Hand express; Coconut oil; DEBP; Education; [employer] brochure Tools: Pump; Flanges; Coconut oil Pump Education: Setup, frequency, and cleaning; Milk  Storage  Plan: Encouraged pumping every 3 hours, ideally 8 pumping sessions/24 hours Breast massage, hand expression and coconut oil were also encouraged prior pumping  FOB present. All questions and concerns answered, family to contact [facility] services PRN.  Consult Status: NICU follow-up NICU Follow-up type: New admission follow up   Alyssa Howell S Philis Nettle 08/05/2022, 7:16 PM

## 2022-08-05 NOTE — Discharge Summary (Signed)
Postpartum Discharge Summary  Date of Service updated***     Patient Name: Alyssa Howell DOB: Oct 10, 2000 MRN: [id number]  Date of admission: 08/04/2022 Delivery date:08/05/2022  Delivering provider: Myrtie Hawk  Date of discharge: 08/05/2022  Admitting diagnosis: Preterm premature rupture of membranes [O42.919] Intrauterine pregnancy: [redacted]w[redacted]d     Secondary diagnosis:  Principal Problem:   Vaginal delivery Active Problems:   Marijuana use   Depression, recurrent   Supervision of other normal pregnancy, antepartum   Preterm premature rupture of membranes  Additional problems: ***    Discharge diagnosis: Preterm Pregnancy Delivered                                              Post partum procedures:{Postpartum procedures:23558} Augmentation: Pitocin, Cytotec, and IP Foley Complications: None  Hospital course: Onset of Labor With Vaginal Delivery      22 y.o. yo G2P1001 at [redacted]w[redacted]d was admitted in PROM and Latent Labor on 08/04/2022. Labor course was complicated by none  Membrane Rupture Time/Date: 9:30 AM ,08/04/2022   Delivery Method:Vaginal, Spontaneous  Episiotomy: None  Lacerations:  None  Patient had a postpartum course complicated by ***.  She is ambulating, tolerating a regular diet, passing flatus, and urinating well. Patient is discharged home in stable condition on 08/05/22.  Newborn Data: Birth date:08/05/2022  Birth time:12:52 PM  Gender:Female  Living status:Living  Apgars:4 ,8  Weight:2590 g   Magnesium Sulfate received: {Mag received:30440022} BMZ received: No Rhophylac:N/A MMR:N/A T-DaP:Given prenatally Flu: No Transfusion:{Transfusion received:30440034}  Physical exam  Vitals:   08/05/22 1405 08/05/22 1410 08/05/22 1415 08/05/22 1420  BP:   (!) 118/59   Pulse:   89   Resp:      Temp:      TempSrc:      SpO2: 93% 98%  99%  Weight:      Height:       General: {Exam; general:21111117} Lochia: {Desc;  appropriate/inappropriate:30686::"appropriate"} Uterine Fundus: {Desc; firm/soft:30687} Incision: {Exam; incision:21111123} DVT Evaluation: {Exam; dvt:2111122} Labs: Lab Results  Component Value Date   WBC 11.5 (H) 08/04/2022   HGB 10.5 (L) 08/04/2022   HCT 31.5 (L) 08/04/2022   MCV 95.5 08/04/2022   PLT 263 08/04/2022      Latest Ref Rng & Units 06/15/2022    8:38 AM  CMP  Glucose 70 - 99 mg/dL 74   BUN 6 - 20 mg/dL 10   Creatinine 0.94 - 1.00 mg/dL 7.09   Sodium 628 - 366 mmol/L 137   Potassium 3.5 - 5.2 mmol/L 4.2   Chloride 96 - 106 mmol/L 103   CO2 20 - 29 mmol/L 20   Calcium 8.7 - 10.2 mg/dL 9.0   Total Protein 6.0 - 8.5 g/dL 6.5   Total Bilirubin 0.0 - 1.2 mg/dL <2.9   Alkaline Phos 44 - 121 IU/L 151   AST 0 - 40 IU/L 11   ALT 0 - 32 IU/L 13    Edinburgh Score:    06/02/2021    5:33 PM  Edinburgh Postnatal Depression Scale Screening Tool  I have been able to laugh and see the funny side of things. 0  I have looked forward with enjoyment to things. 0  I have blamed myself unnecessarily when things went wrong. 2  I have been anxious or worried for no good reason. 0  I have felt scared or panicky for  no good reason. 1  Things have been getting on top of me. 1  I have been so unhappy that I have had difficulty sleeping. 0  I have felt sad or miserable. 0  I have been so unhappy that I have been crying. 1  The thought of harming myself has occurred to me. 0  Edinburgh Postnatal Depression Scale Total 5     After visit meds:  Allergies as of 08/05/2022       Reactions   Abilify [aripiprazole] Other (See Comments)   Seizures     Med Rec must be completed prior to using this [facility]***        Discharge home in stable condition Infant Feeding: {Baby feeding:23562} Infant Disposition:{CHL IP OB HOME WITH ZOXWRU:04540} Discharge instruction: per After Visit Summary and Postpartum booklet. Activity: Advance as tolerated. Pelvic rest for 6 weeks.  Diet:  {OB JWJX:91478295} Future Appointments: Future Appointments  Date Time Provider Department Center  08/09/2022  8:30 AM [facility] NURSE [facility]  08/09/2022  8:45 AM [facility] US6 [facility]  08/10/2022 10:35 AM Raelyn Mora, CNM [facility] None  08/14/2022  3:30 PM Desenglau, Shireen Quan, PT [facility] None  08/17/2022 10:55 AM Corlis Hove, NP [facility] None   Follow up Visit: Message sent to [facility] 4/13  Please schedule this patient for a In person postpartum visit in 6 weeks with the following provider: Any provider. Additional Postpartum F/U:Postpartum Depression checkup  High risk pregnancy complicated by:  Depression , THC use Delivery mode:  Vaginal, Spontaneous  Anticipated Birth Control:   patch @ 6wk   08/05/2022 Myrtie Hawk, DO

## 2022-08-05 NOTE — Progress Notes (Signed)
Patient sitting up for epidural placement. Fluid bolus given. Anesthesia to room and consents signed. Patient tolerated procedure and reposition supine with right hip tilt. Vitals remain stable.

## 2022-08-05 NOTE — Plan of Care (Signed)

## 2022-08-05 NOTE — Anesthesia Preprocedure Evaluation (Signed)
Anesthesia Evaluation  Patient identified by MRN, date of birth, ID band Patient awake    Reviewed: Allergy & Precautions, Patient's Chart, lab work & pertinent test results  Airway Mallampati: II  TM Distance: >3 FB Neck ROM: Full    Dental no notable dental hx.    Pulmonary neg pulmonary ROS   Pulmonary exam normal breath sounds clear to auscultation       Cardiovascular negative cardio ROS Normal cardiovascular exam Rhythm:Regular Rate:Normal     Neuro/Psych Seizures - (remote hx as child, no meds), Well Controlled,  PSYCHIATRIC DISORDERS  Depression       GI/Hepatic Neg liver ROS,GERD  Controlled,,  Endo/Other  Obesity BMI 36  Renal/GU negative Renal ROS  negative genitourinary   Musculoskeletal negative musculoskeletal ROS (+)    Abdominal  (+) + obese  Peds negative pediatric ROS (+)  Hematology  (+) Blood dyscrasia, anemia Hb 10.5, plt 263   Anesthesia Other Findings   Reproductive/Obstetrics (+) Pregnancy                             Anesthesia Physical Anesthesia Plan  ASA: 2  Anesthesia Plan: Epidural   Post-op Pain Management:    Induction:   PONV Risk Score and Plan: 2  Airway Management Planned: Natural Airway  Additional Equipment: None  Intra-op Plan:   Post-operative Plan:   Informed Consent: I have reviewed the patients History and Physical, chart, labs and discussed the procedure including the risks, benefits and alternatives for the proposed anesthesia with the patient or authorized representative who has indicated his/her understanding and acceptance.       Plan Discussed with:   Anesthesia Plan Comments:        Anesthesia Quick Evaluation

## 2022-08-05 NOTE — Progress Notes (Signed)
Labor Progress Note  Alyssa Howell is a 22 y.o. G2P1001 at [redacted]w[redacted]d presented for PROM at 4/12 0100  S: Doing well. Has epidural and does not feel contractions  O:  BP 108/68   Pulse 67   Temp 97.9 F (36.6 C) (Oral)   Resp 18   Ht 5\' 5"  (1.651 m)   Wt 97.9 kg   LMP 10/22/2021 (Exact Date)   SpO2 98%   BMI 35.91 kg/m  EFM: 130 bpm /Moderate variability/ 10x10 accels/ None decels Toco: q 5-10 min  CVE: Dilation: 5 Effacement (%): 60 Station: -2 Presentation: Vertex Exam by:: Aura Dials RN   A&P: 22 y.o. G2P1001 [redacted]w[redacted]d  here for PROM as above  #Labor: Progressing well. Start pitocin to increase contraction frequency and intensity #Pain: Family/Friend support and Epidural #FWB: CAT 1 #GBS not done- PCN in labor  Myrtie Hawk, DO FMOB Fellow, Faculty practice [facility], Center for [facility] 08/05/22  9:44 AM

## 2022-08-06 LAB — CBC
HCT: 26.7 % — ABNORMAL LOW (ref 36.0–46.0)
Hemoglobin: 9 g/dL — ABNORMAL LOW (ref 12.0–15.0)
MCH: 32.1 pg (ref 26.0–34.0)
MCHC: 33.7 g/dL (ref 30.0–36.0)
MCV: 95.4 fL (ref 80.0–100.0)
Platelets: 208 10*3/uL (ref 150–400)
RBC: 2.8 MIL/uL — ABNORMAL LOW (ref 3.87–5.11)
RDW: 12.9 % (ref 11.5–15.5)
WBC: 15.2 10*3/uL — ABNORMAL HIGH (ref 4.0–10.5)
nRBC: 0 % (ref 0.0–0.2)

## 2022-08-06 NOTE — Progress Notes (Signed)
Post Partum Day 1 Subjective: no complaints, up ad lib, voiding, tolerating PO, + flatus, and crampy when pumping  Objective: Blood pressure 112/61, pulse 81, temperature 98.5 F (36.9 C), temperature source Oral, resp. rate 16, height 5\' 5"  (1.651 m), weight 97.9 kg, last menstrual period 10/22/2021, SpO2 100 %, unknown if currently breastfeeding.  Physical Exam:  General: alert, cooperative, and no distress Lochia: appropriate Uterine Fundus: firm Incision:  DVT Evaluation: No evidence of DVT seen on physical exam.  Recent Labs    08/04/22 1736  HGB 10.5*  HCT 31.5*    Assessment/Plan: Plan for discharge tomorrow and Breastfeeding Baby will be here a few weeks likely  LOS: 2 days   Lazaro Arms, MD 08/06/2022, 7:29 AM

## 2022-08-06 NOTE — Lactation Note (Signed)
This note was copied from a baby's chart.  NICU Lactation Consultation Note  Patient Name: Boy Linsay Arno [id number] Date: 08/06/2022 Age:21 hours  Reason for consult: Follow-up assessment; NICU baby; Infant < 6lbs; Late-preterm 34-36.6wks  SUBJECTIVE Visited with family of 21 hours old LPI NICU female; Ms. Alfonzo Beers is a P2 and reports she's pumping consistently and already getting 32 ml of EBM but not every time she pumps. Reviewed normalcy patterns for the first 3-5 days prior the onset of lactogenesis II and pump settings in case she continues getting +20 ml of EBM combined. She voiced discomfort on her L side, resized her flanges to # 21. Revised pumping schedule and anticipatory guidelines.   OBJECTIVE Infant data: Mother's Current Feeding Choice: Breast Milk and Donor Milk  Infant feeding assessment Scale for Readiness: 5   Maternal data: G2P1102  Vaginal, Spontaneous Hand Expression Comments: no colostrum noted at this time Significant Breast History:: (+) breast changes during the pregnancy Current breast feeding challenges:: NICU admission Previous breastfeeding challenges?: Infant separation (1st baby was also in NICU) Does the patient have breastfeeding experience prior to this delivery?: Yes How long did the patient breastfeed?: 1.5 months Pumping frequency: 5 times/24 hours Pumped volume: 32 mL Flange Size: 21 (Resized to # 21 flanges on 08/06/2022) Risk factor for low milk supply:: prematurity, infant separation, maternal blood loss of 658 cc.  WIC Program: Yes WIC Referral Sent?: Yes What county?: [place]  ASSESSMENT Infant: Feeding Status: Scheduled 9-12-3-6  Maternal: Milk volume: Normal  INTERVENTIONS/PLAN Interventions: Interventions: Breast feeding basics reviewed; Coconut oil; DEBP; Education Tools: Pump; Flanges; Coconut oil Pump Education: Setup, frequency, and cleaning; Milk Storage  Plan: Encouraged pumping every 3 hours, ideally 8 pumping  sessions/24 hours; she'll try the # 21 flanges Breast massage, hand expression and coconut oil were also encouraged prior pumping She'll switch her pump settings from initiation to expression mode once she starts getting 20 ml of EBM combined, at least in 2 consecutive pumping sessions   FOB present. All questions and concerns answered, family to contact [facility] services PRN.  Consult Status: NICU follow-up NICU Follow-up type: Maternal D/C visit   Karolyne Timmons Venetia Constable 08/06/2022, 1:37 PM

## 2022-08-06 NOTE — Anesthesia Postprocedure Evaluation (Signed)
Anesthesia Post Note  Patient: Alyssa Howell  Procedure(s) Performed: AN AD HOC LABOR EPIDURAL     Patient location during evaluation: Mother Baby Anesthesia Type: Epidural Level of consciousness: awake and alert Pain management: pain level controlled Vital Signs Assessment: post-procedure vital signs reviewed and stable Respiratory status: spontaneous breathing, nonlabored ventilation and respiratory function stable Cardiovascular status: stable Postop Assessment: no headache, no backache, epidural receding, no apparent nausea or vomiting, patient able to bend at knees, able to ambulate and adequate PO intake Anesthetic complications: no   No notable events documented.  Last Vitals:  Vitals:   08/06/22 0416 08/06/22 0757  BP: 112/61 122/71  Pulse: 81 90  Resp: 16 16  Temp: 36.9 C 36.7 C  SpO2: 100% 100%    Last Pain:  Vitals:   08/06/22 0757  TempSrc: Oral  PainSc:    Pain Goal: Patients Stated Pain Goal: 3 (08/06/22 0500)                 Laban Emperor

## 2022-08-07 ENCOUNTER — Ambulatory Visit (HOSPITAL_COMMUNITY): Payer: Self-pay

## 2022-08-07 LAB — CULTURE, BETA STREP (GROUP B ONLY)

## 2022-08-07 MED ORDER — IBUPROFEN 600 MG PO TABS: 600.0000 mg | ORAL_TABLET | Freq: Four times a day (QID) | ORAL | 0 refills | Status: DC

## 2022-08-07 NOTE — Lactation Note (Signed)
This note was copied from a baby's chart.  NICU Lactation Consultation Note  Patient Name: Alyssa Howell [id number] Date: 08/07/2022 Age:22 years  Reason for consult: Follow-up assessment; NICU baby; Late-preterm 34-36.6wks; Infant < 6lbs; Mother's request  SUBJECTIVE  LC in to assist Mom with a hand's free pumping band, size large.  Mom using 21 mm flanges.  Encouraged STS when able to, Mom just did STS, and pumping both breasts on initiation setting after. Mom was able to obtain her [facility] pump for home use, today. Reviewed engorgement prevention and treatment.  OBJECTIVE Infant data: Mother's Current Feeding Choice: Breast Milk and Donor Milk  Infant feeding assessment Scale for Readiness: 5   Maternal data: G2P1102  Vaginal, Spontaneous Pumping frequency: every 3 hrs consistently Pumped volume: 3 mL Flange Size: 21  WIC Program: Yes WIC Referral Sent?: Yes What county?: [place] Pump: WIC Pump  ASSESSMENT Infant: Feeding Status: NPO  Maternal: Milk volume: Normal  INTERVENTIONS/PLAN Interventions: Interventions: Breast feeding basics reviewed; Skin to skin; Breast massage; Hand express; DEBP; Education Discharge Education: Engorgement and breast care Tools: Pump; Flanges; Hands-free pumping top Pump Education: Setup, frequency, and cleaning; Milk Storage  Plan: Consult Status: NICU follow-up NICU Follow-up type: Verify onset of copious milk; Verify absence of engorgement   Alyssa Howell 08/07/2022, 4:16 PM

## 2022-08-07 NOTE — Lactation Note (Addendum)
This note was copied from a baby's chart.  NICU Lactation Consultation Note  Patient Name: Alyssa Howell [id number] Date: 08/07/2022 Age:21 hours  Reason for consult: Follow-up assessment; NICU baby; Late-preterm 34-36.6wks; Infant < 6lbs  SUBJECTIVE  LC met with P2 Mom of baby Alyssa in the NICU, briefly as she was being discharged.  Mom plans to go home to see her first baby and then come back up to baby's room.  Mom reports expressing between 10-30 ml.  She is trying to be consistent.  Hand's free pumping band provided and RN to call upon Mom's return for [facility] to assist with this.  Mom has an appt with WIC on 4/17, plans to use her hand pump when not in baby's room where she will use the [employer].  OBJECTIVE Infant data: Mother's Current Feeding Choice: Breast Milk and Donor Milk  Infant feeding assessment Scale for Readiness: 5   Maternal data: G2P1102  Vaginal, Spontaneous Pumping frequency: pumping every 3 hrs, trying to be consistent Pumped volume: 10 mL (to 30 ml) Flange Size: 21  WIC Program: Yes WIC Referral Sent?: Yes What county?: [place]  ASSESSMENT Infant: Feeding Status: NPO  Maternal: Milk volume: Normal  INTERVENTIONS/PLAN Interventions: Interventions: Breast feeding basics reviewed; Coconut oil; DEBP; Education Discharge Education: Engorgement and breast care Tools: Pump; Flanges; Hands-free pumping top Pump Education: Setup, frequency, and cleaning; Milk Storage  Plan: Consult Status: NICU follow-up NICU Follow-up type: Verify onset of copious milk; Verify absence of engorgement; Maternal D/C visit   [name] 08/07/2022, 9:20 AM

## 2022-08-08 LAB — GC/CHLAMYDIA PROBE AMP (~~LOC~~) NOT AT ARMC
Chlamydia: NEGATIVE
Comment: NEGATIVE
Comment: NORMAL
Neisseria Gonorrhea: NEGATIVE

## 2022-08-08 LAB — SURGICAL PATHOLOGY

## 2022-08-09 ENCOUNTER — Ambulatory Visit: Payer: Medicaid Other

## 2022-08-10 ENCOUNTER — Encounter: Payer: Medicaid Other | Admitting: Obstetrics and Gynecology

## 2022-08-11 ENCOUNTER — Inpatient Hospital Stay (HOSPITAL_COMMUNITY): Payer: Medicaid Other

## 2022-08-11 ENCOUNTER — Encounter (HOSPITAL_COMMUNITY): Payer: Self-pay | Admitting: Obstetrics and Gynecology

## 2022-08-11 ENCOUNTER — Inpatient Hospital Stay (HOSPITAL_COMMUNITY)
Admission: AD | Admit: 2022-08-11 | Discharge: 2022-08-13 | DRG: 769 | Disposition: A | Discharge status: Home / Self Care | Payer: Medicaid Other | Attending: Obstetrics and Gynecology | Admitting: Obstetrics and Gynecology

## 2022-08-11 DIAGNOSIS — O8612 Endometritis following delivery: Secondary | ICD-10-CM | POA: Diagnosis present

## 2022-08-11 DIAGNOSIS — Z3480 Encounter for supervision of other normal pregnancy, unspecified trimester: Secondary | ICD-10-CM

## 2022-08-11 DIAGNOSIS — D62 Acute posthemorrhagic anemia: Secondary | ICD-10-CM | POA: Insufficient documentation

## 2022-08-11 DIAGNOSIS — O98213 Gonorrhea complicating pregnancy, third trimester: Principal | ICD-10-CM

## 2022-08-11 HISTORY — DX: Anemia, unspecified: D64.9

## 2022-08-11 LAB — TYPE AND SCREEN

## 2022-08-11 MED ORDER — ONDANSETRON HCL 4 MG/2ML IJ SOLN
INTRAMUSCULAR | Status: AC
  Filled 2022-08-11: qty 2

## 2022-08-11 MED ORDER — LACTATED RINGERS IV BOLUS
1000.0000 mL | Freq: Once | INTRAVENOUS | Status: AC
  Administered 2022-08-11: 1000 mL via INTRAVENOUS

## 2022-08-11 MED ORDER — HYDROMORPHONE HCL 1 MG/ML IJ SOLN
1.0000 mg | Freq: Once | INTRAMUSCULAR | Status: AC
  Administered 2022-08-11: 1 mg via INTRAVENOUS
  Filled 2022-08-11: qty 1

## 2022-08-11 MED ORDER — ONDANSETRON HCL 4 MG/2ML IJ SOLN
4.0000 mg | Freq: Once | INTRAMUSCULAR | Status: AC
  Administered 2022-08-11: 4 mg via INTRAVENOUS

## 2022-08-11 MED ORDER — ACETAMINOPHEN-CAFFEINE 500-65 MG PO TABS
1.0000 | ORAL_TABLET | Freq: Once | ORAL | Status: AC
  Administered 2022-08-12: 1 via ORAL
  Filled 2022-08-11: qty 1

## 2022-08-11 NOTE — MAU Provider Note (Signed)
History     CSN: [id number]  Arrival date and time: 08/11/22 2016   Event Date/Time   First Provider Initiated Contact with Patient 08/11/22 2317      Chief Complaint  Patient presents with   Abdominal Pain   Vaginal Pain   generalized weakness        Vaginal Bleeding    Reports passing multiple clots with urination and in shower that vary in size-up to golf ball size Saturating pads every 2 hours-dark and bright red on peri pad Reports this since delivery with worsening over the week   Alyssa Howell , a  22 y.o. G2P1102 at 5 days Postpartum (vaginal delivery) presents to MAU with complaints of fever, abdominal pain and increased vaginal bleeding and passing clots. Patient reports that today she has just "not felt well." She states she has been fatigued with no appetite, and intermittent shooting abdominal pain. She reports taking  Ibuprofen last dose at 5pm without relief. Currently rating pain 10/10. She states her abdomen is "very tender and sore, more than normal." She states that she also feels weak and reports almost dropping her baby today in NICU.   She states that her vaginal bleeding is heavier, bright red and reports passing 2-3 golf ball to small orange- sized clots today. She states she passed "several" in the shower and one in the toilet today that "covered the bottom of the bowl." She states that she is saturating a pad every 2 hours.   She reports also running a fever of 102.1 this afternoon. Reports she took a shower to break the fever. She denies abnormal vaginal discharge, urinary symptoms and back pain.          Abdominal Pain Associated symptoms include a fever. Pertinent negatives include no constipation, diarrhea, dysuria, headaches, nausea or vomiting.  Vaginal Pain The patient's pertinent negatives include no pelvic pain or vaginal discharge. Associated symptoms include abdominal pain and a fever. Pertinent negatives include no back pain, chills,  constipation, diarrhea, dysuria, headaches, nausea or vomiting.  Vaginal Bleeding The patient's pertinent negatives include no pelvic pain or vaginal discharge. Associated symptoms include abdominal pain and a fever. Pertinent negatives include no back pain, chills, constipation, diarrhea, dysuria, headaches, nausea or vomiting.    {GYN/OB [id number]  Past Medical History:  Diagnosis Date   ADHD    Chlamydia 2022   Depression    Kidney stones    Seizures    x 1 in 5th grade after taking ability. No other episodes.   Vesico-ureteral reflux     Past Surgical History:  Procedure Laterality Date   CYSTOSCOPY/URETEROSCOPY/HOLMIUM LASER/STENT PLACEMENT Right 08/02/2020   Procedure: CYSTOSCOPY/URETEROSCOPY/HOLMIUM LASER/STENT PLACEMENT;  Surgeon: Vanna Scotland, MD;  Location: [facility] ORS;  Service: Urology;  Laterality: Right;   TONSILLECTOMY      Family History  Problem Relation Age of Onset   Hepatitis C Mother    Valvular heart disease Mother    Other Father        no contact in over 46yrs   Cancer Maternal Grandmother    Breast cancer Maternal Grandmother    Diabetes Other    Heart attack Other    Asthma Neg Hx    Heart disease Neg Hx    Hypertension Neg Hx     Social History   Tobacco Use   Smoking status: Never   Smokeless tobacco: Never  Vaping Use   Vaping Use: Never used  Substance Use Topics   Alcohol use: Not  Currently   Drug use: Not Currently    Types: Marijuana    Comment: last used the end of September 2022 as of 02/07/2021    Allergies:  Allergies  Allergen Reactions   Abilify [Aripiprazole] Other (See Comments)    Seizures    Medications Prior to Admission  Medication Sig Dispense Refill Last Dose   ibuprofen (ADVIL) 600 MG tablet Take 1 tablet (600 mg total) by mouth every 6 (six) hours. 30 tablet 0 08/11/2022 at 1700   Prenatal Vit-Fe Fumarate-FA (PRENATAL MULTIVITAMIN) TABS tablet Take 1 tablet by mouth daily at 12 noon. 90 tablet 3  08/11/2022   ascorbic acid (VITAMIN C) 500 MG tablet Take 1 tablet (500 mg total) by mouth every other day. Take with iron pill (Patient not taking: Reported on 07/12/2022) 30 tablet 3    cyclobenzaprine (FLEXERIL) 5 MG tablet Take 1 tablet (5 mg total) by mouth 3 (three) times daily as needed for up to 30 doses for muscle spasms. 30 tablet 0    Magnesium Oxide -Mg Supplement (MAG-OXIDE) 200 MG TABS Take 2 tablets (400 mg total) by mouth at bedtime. If that amount causes loose stools in the am, switch to  daily at bedtime. 60 tablet 3     Review of Systems  Constitutional:  Positive for fatigue and fever. Negative for chills.  Eyes:  Negative for pain and visual disturbance.  Respiratory:  Negative for apnea, shortness of breath and wheezing.   Cardiovascular:  Negative for chest pain and palpitations.  Gastrointestinal:  Positive for abdominal pain. Negative for constipation, diarrhea, nausea and vomiting.  Genitourinary:  Positive for vaginal bleeding and vaginal pain. Negative for difficulty urinating, dysuria, pelvic pain and vaginal discharge.  Musculoskeletal:  Negative for back pain.  Neurological:  Positive for dizziness, weakness and light-headedness. Negative for seizures and headaches.  Psychiatric/Behavioral:  Negative for suicidal ideas.    Physical Exam   Blood pressure 118/69, pulse (!) 120, temperature 99.8 F (37.7 C), temperature source Axillary, resp. rate 18, height  (1.676 m), weight 94 kg, last menstrual period 10/22/2021, SpO2 96 %, unknown if currently breastfeeding.  Physical Exam Vitals and nursing note reviewed. Exam conducted with a chaperone present.  Constitutional:      General: She is not in acute distress.    Appearance: Normal appearance. She is ill-appearing.  HENT:     Head: Normocephalic.  Cardiovascular:     Rate and Rhythm: Tachycardia present.  Pulmonary:     Effort: Pulmonary effort is normal.  Abdominal:     Palpations: Abdomen is  soft.     Tenderness: There is abdominal tenderness. There is guarding. There is no right CVA tenderness or left CVA tenderness.     Comments: Uterus firm 2 below. Barely palpable   Genitourinary:    Vagina: Bleeding present.     Comments: Current pad saturated with brown and streaks of dark red blood (applied 4hours ago)  Musculoskeletal:     Cervical back: Normal range of motion.  Skin:    General: Skin is warm and dry.     Capillary Refill: Capillary refill takes 2 to 3 seconds.  Neurological:     Mental Status: She is alert and oriented to person, place, and time.  Psychiatric:        Mood and Affect: Mood normal.     MAU Course  Procedures Orders Placed This Encounter  Procedures   US Pelvis Complete   CBC with Differential/Platelet   Comprehensive  metabolic panel   Type and screen   Insert peripheral IV   Meds ordered this encounter  Medications   lactated ringers bolus 1,000 mL   HYDROmorphone (DILAUDID) injection 1 mg   acetaminophen-caffeine (EXCEDRIN TENSION HEADACHE) 500-65 MG per tablet 1 tablet   ondansetron (ZOFRAN) injection 4 mg   ondansetron (ZOFRAN) 4 MG/2ML injection    Daphine Deutscher, Dea F: cabinet override   Patient Vitals for the past 24 hrs:  BP Temp Temp src Pulse Resp SpO2 Height Weight  08/11/22 2235 -- -- -- -- -- 96 % -- --  08/11/22 2230 -- -- -- -- -- 97 % -- --  08/11/22 2225 -- -- -- -- -- 96 % -- --  08/11/22 2220 -- -- -- -- -- 98 % -- --  08/11/22 2215 -- -- -- -- -- 98 % -- --  08/11/22 2208 -- 99.8 F (37.7 C) Axillary -- -- -- -- --  08/11/22 2123 -- -- -- -- -- 99 % -- --  08/11/22 2115 118/69 99 F (37.2 C) Oral (!) 120 18 -- -- --  08/11/22 2055 120/68 99.1 F (37.3 C) Oral (!) 129 18 -- 5\' 6"  (1.676 m) 94 kg   CNM reviewed delivery notes. EBL 658-manual uterine sweep for brisk bleeding. Placenta intact per delivery note.   MDM - Patient tachycardic and very warm to touch. Currently afebrile, but IV fluid bolus ordered.  - IV  dilaudid ordered.  -   Assessment and Plan  ***  Claudette Head 08/11/2022, 11:17 PM

## 2022-08-11 NOTE — MAU Note (Addendum)
Pt says she del vag- 08-05-2022  Baby is in NICU Pt came from NICU  At 7pm- When she was at home - Temp 102.5 before she took a shower  She feels  sharp pain in abd  and vag since yesterday afternoon - now 9/10- took 600 Ibuprofen at 5pm - some relief  Has blood clots- golf ball size -

## 2022-08-12 ENCOUNTER — Encounter (HOSPITAL_COMMUNITY): Payer: Self-pay | Admitting: Obstetrics and Gynecology

## 2022-08-12 ENCOUNTER — Inpatient Hospital Stay (HOSPITAL_COMMUNITY): Payer: Medicaid Other | Admitting: Anesthesiology

## 2022-08-12 ENCOUNTER — Inpatient Hospital Stay (HOSPITAL_COMMUNITY): Payer: Medicaid Other

## 2022-08-12 ENCOUNTER — Encounter (HOSPITAL_COMMUNITY)
Admission: AD | Disposition: A | Discharge status: Home / Self Care | Payer: Self-pay | Source: Home / Self Care | Attending: Obstetrics and Gynecology

## 2022-08-12 ENCOUNTER — Other Ambulatory Visit: Payer: Self-pay

## 2022-08-12 DIAGNOSIS — Z3A00 Weeks of gestation of pregnancy not specified: Secondary | ICD-10-CM

## 2022-08-12 DIAGNOSIS — N939 Abnormal uterine and vaginal bleeding, unspecified: Secondary | ICD-10-CM | POA: Diagnosis not present

## 2022-08-12 DIAGNOSIS — O8612 Endometritis following delivery: Secondary | ICD-10-CM | POA: Diagnosis present

## 2022-08-12 HISTORY — PX: DILATION AND CURETTAGE OF UTERUS: SHX78

## 2022-08-12 LAB — COMPREHENSIVE METABOLIC PANEL
ALT: 17 U/L (ref 0–44)
AST: 16 U/L (ref 15–41)
Albumin: 2.7 g/dL — ABNORMAL LOW (ref 3.5–5.0)
Alkaline Phosphatase: 125 U/L (ref 38–126)
Anion gap: 11 (ref 5–15)
BUN: 15 mg/dL (ref 6–20)
CO2: 22 mmol/L (ref 22–32)
Calcium: 9 mg/dL (ref 8.9–10.3)
Chloride: 106 mmol/L (ref 98–111)
Creatinine, Ser: 0.77 mg/dL (ref 0.44–1.00)
GFR, Estimated: >60 mL/min (ref >60)
Glucose, Bld: 93 mg/dL (ref 70–99)
Potassium: 3.7 mmol/L (ref 3.5–5.1)
Sodium: 139 mmol/L (ref 135–145)
Total Bilirubin: 0.4 mg/dL (ref 0.3–1.2)
Total Protein: 6.4 g/dL — ABNORMAL LOW (ref 6.5–8.1)

## 2022-08-12 LAB — CBC WITH DIFFERENTIAL/PLATELET
Abs Immature Granulocytes: 0.15 10*3/uL — ABNORMAL HIGH (ref 0.00–0.07)
Basophils Absolute: 0.1 10*3/uL (ref 0.0–0.1)
Basophils Relative: 1 %
Eosinophils Absolute: 0.6 10*3/uL — ABNORMAL HIGH (ref 0.0–0.5)
Eosinophils Relative: 4 %
HCT: 29.1 % — ABNORMAL LOW (ref 36.0–46.0)
Hemoglobin: 9.7 g/dL — ABNORMAL LOW (ref 12.0–15.0)
Immature Granulocytes: 1 %
Lymphocytes Relative: 18 %
Lymphs Abs: 2.9 10*3/uL (ref 0.7–4.0)
MCH: 31.4 pg (ref 26.0–34.0)
MCHC: 33.3 g/dL (ref 30.0–36.0)
MCV: 94.2 fL (ref 80.0–100.0)
Monocytes Absolute: 1.2 10*3/uL — ABNORMAL HIGH (ref 0.1–1.0)
Monocytes Relative: 8 %
Neutro Abs: 10.9 10*3/uL — ABNORMAL HIGH (ref 1.7–7.7)
Neutrophils Relative %: 68 %
Platelets: 402 10*3/uL — ABNORMAL HIGH (ref 150–400)
RBC: 3.09 MIL/uL — ABNORMAL LOW (ref 3.87–5.11)
RDW: 13 % (ref 11.5–15.5)
WBC: 15.8 10*3/uL — ABNORMAL HIGH (ref 4.0–10.5)
nRBC: 0 % (ref 0.0–0.2)

## 2022-08-12 LAB — TYPE AND SCREEN: ABO/RH(D): A POS

## 2022-08-12 SURGERY — DILATION AND CURETTAGE
Anesthesia: Monitor Anesthesia Care

## 2022-08-12 MED ORDER — CHLORHEXIDINE GLUCONATE 0.12 % MT SOLN
OROMUCOSAL | Status: AC
  Administered 2022-08-12: 15 mL via OROMUCOSAL
  Filled 2022-08-12: qty 15

## 2022-08-12 MED ORDER — FENTANYL CITRATE (PF) 250 MCG/5ML IJ SOLN
INTRAMUSCULAR | Status: DC | PRN
  Administered 2022-08-12 (×2): 25 ug via INTRAVENOUS

## 2022-08-12 MED ORDER — FENTANYL CITRATE (PF) 250 MCG/5ML IJ SOLN
INTRAMUSCULAR | Status: AC
  Filled 2022-08-12: qty 5

## 2022-08-12 MED ORDER — ACETAMINOPHEN 10 MG/ML IV SOLN
1000.0000 mg | Freq: Once | INTRAVENOUS | Status: DC | PRN
  Administered 2022-08-12: 1000 mg via INTRAVENOUS

## 2022-08-12 MED ORDER — LIDOCAINE 2% (20 MG/ML) 5 ML SYRINGE
INTRAMUSCULAR | Status: AC
  Filled 2022-08-12: qty 5

## 2022-08-12 MED ORDER — PHENYLEPHRINE 80 MCG/ML (10ML) SYRINGE FOR IV PUSH (FOR BLOOD PRESSURE SUPPORT)
PREFILLED_SYRINGE | INTRAVENOUS | Status: DC | PRN
  Administered 2022-08-12: 80 ug via INTRAVENOUS

## 2022-08-12 MED ORDER — METHYLERGONOVINE MALEATE 0.2 MG/ML IJ SOLN
INTRAMUSCULAR | Status: AC
  Filled 2022-08-12: qty 1

## 2022-08-12 MED ORDER — ACETAMINOPHEN 160 MG/5ML PO SOLN: 325.0000 mg | ORAL | Status: DC | PRN

## 2022-08-12 MED ORDER — OXYCODONE HCL 5 MG PO TABS: 5.0000 mg | ORAL_TABLET | Freq: Once | ORAL | Status: DC | PRN

## 2022-08-12 MED ORDER — LACTATED RINGERS IV SOLN: INTRAVENOUS | Status: DC

## 2022-08-12 MED ORDER — AMISULPRIDE (ANTIEMETIC) 5 MG/2ML IV SOLN: 10.0000 mg | Freq: Once | INTRAVENOUS | Status: DC | PRN

## 2022-08-12 MED ORDER — ORAL CARE MOUTH RINSE: 15.0000 mL | Freq: Once | OROMUCOSAL | Status: AC

## 2022-08-12 MED ORDER — METHYLERGONOVINE MALEATE 0.2 MG/ML IJ SOLN
INTRAMUSCULAR | Status: DC | PRN
  Administered 2022-08-12: .2 mg via INTRAMUSCULAR

## 2022-08-12 MED ORDER — ACETAMINOPHEN 325 MG PO TABS: 325.0000 mg | ORAL_TABLET | ORAL | Status: DC | PRN

## 2022-08-12 MED ORDER — ONDANSETRON HCL 4 MG/2ML IJ SOLN
INTRAMUSCULAR | Status: DC | PRN
  Administered 2022-08-12: 4 mg via INTRAVENOUS

## 2022-08-12 MED ORDER — TRANEXAMIC ACID-NACL 1000-0.7 MG/100ML-% IV SOLN
INTRAVENOUS | Status: AC
  Filled 2022-08-12: qty 100

## 2022-08-12 MED ORDER — IBUPROFEN 600 MG PO TABS
600.0000 mg | ORAL_TABLET | Freq: Once | ORAL | Status: AC
  Filled 2022-08-12: qty 1

## 2022-08-12 MED ORDER — ACETAMINOPHEN 10 MG/ML IV SOLN
INTRAVENOUS | Status: AC
  Filled 2022-08-12: qty 100

## 2022-08-12 MED ORDER — LIDOCAINE 2% (20 MG/ML) 5 ML SYRINGE
INTRAMUSCULAR | Status: DC | PRN
  Administered 2022-08-12: 40 mg via INTRAVENOUS

## 2022-08-12 MED ORDER — FENTANYL CITRATE (PF) 100 MCG/2ML IJ SOLN
25.0000 ug | INTRAMUSCULAR | Status: DC | PRN
  Administered 2022-08-12 (×2): 50 ug via INTRAVENOUS

## 2022-08-12 MED ORDER — ALBUMIN HUMAN 5 % IV SOLN: INTRAVENOUS | Status: DC | PRN

## 2022-08-12 MED ORDER — IBUPROFEN 600 MG PO TABS
600.0000 mg | ORAL_TABLET | Freq: Four times a day (QID) | ORAL | Status: DC | PRN
  Administered 2022-08-12 – 2022-08-13 (×4): 600 mg via ORAL
  Filled 2022-08-12 (×3): qty 1

## 2022-08-12 MED ORDER — ONDANSETRON HCL 4 MG PO TABS: 4.0000 mg | ORAL_TABLET | Freq: Four times a day (QID) | ORAL | Status: DC | PRN

## 2022-08-12 MED ORDER — METHYLERGONOVINE MALEATE 0.2 MG PO TABS
0.2000 mg | ORAL_TABLET | Freq: Three times a day (TID) | ORAL | Status: AC
  Administered 2022-08-12 (×3): 0.2 mg via ORAL
  Filled 2022-08-12 (×4): qty 1

## 2022-08-12 MED ORDER — PHENYLEPHRINE 80 MCG/ML (10ML) SYRINGE FOR IV PUSH (FOR BLOOD PRESSURE SUPPORT)
PREFILLED_SYRINGE | INTRAVENOUS | Status: AC
  Filled 2022-08-12: qty 10

## 2022-08-12 MED ORDER — MIDAZOLAM HCL 5 MG/5ML IJ SOLN
INTRAMUSCULAR | Status: DC | PRN
  Administered 2022-08-12: 2 mg via INTRAVENOUS

## 2022-08-12 MED ORDER — PROMETHAZINE HCL 25 MG/ML IJ SOLN: 6.2500 mg | INTRAMUSCULAR | Status: DC | PRN

## 2022-08-12 MED ORDER — ONDANSETRON HCL 4 MG/2ML IJ SOLN
INTRAMUSCULAR | Status: AC
  Filled 2022-08-12: qty 2

## 2022-08-12 MED ORDER — ONDANSETRON HCL 4 MG/2ML IJ SOLN
4.0000 mg | Freq: Four times a day (QID) | INTRAMUSCULAR | Status: DC | PRN
  Administered 2022-08-12: 4 mg via INTRAVENOUS
  Filled 2022-08-12: qty 2

## 2022-08-12 MED ORDER — ARTIFICIAL TEARS OPHTHALMIC OINT
TOPICAL_OINTMENT | OPHTHALMIC | Status: AC
  Filled 2022-08-12: qty 3.5

## 2022-08-12 MED ORDER — OXYCODONE HCL 5 MG/5ML PO SOLN: 5.0000 mg | Freq: Once | ORAL | Status: DC | PRN

## 2022-08-12 MED ORDER — OXYCODONE-ACETAMINOPHEN 5-325 MG PO TABS
1.0000 | ORAL_TABLET | Freq: Four times a day (QID) | ORAL | Status: DC | PRN
  Administered 2022-08-12 (×3): 1 via ORAL
  Administered 2022-08-12 – 2022-08-13 (×3): 2 via ORAL
  Filled 2022-08-12: qty 2
  Filled 2022-08-12 (×3): qty 1
  Filled 2022-08-12 (×2): qty 2

## 2022-08-12 MED ORDER — MIDAZOLAM HCL 2 MG/2ML IJ SOLN
INTRAMUSCULAR | Status: AC
  Filled 2022-08-12: qty 2

## 2022-08-12 MED ORDER — CHLORHEXIDINE GLUCONATE 0.12 % MT SOLN: 15.0000 mL | Freq: Once | OROMUCOSAL | Status: AC

## 2022-08-12 MED ORDER — PROPOFOL 500 MG/50ML IV EMUL
INTRAVENOUS | Status: DC | PRN
  Administered 2022-08-12: 200 ug/kg/min via INTRAVENOUS

## 2022-08-12 MED ORDER — PROPOFOL 10 MG/ML IV BOLUS
INTRAVENOUS | Status: AC
  Filled 2022-08-12: qty 20

## 2022-08-12 MED ORDER — PROPOFOL 10 MG/ML IV BOLUS
INTRAVENOUS | Status: DC | PRN
  Administered 2022-08-12: 50 mg via INTRAVENOUS
  Administered 2022-08-12: 40 mg via INTRAVENOUS

## 2022-08-12 MED ORDER — 0.9 % SODIUM CHLORIDE (POUR BTL) OPTIME
TOPICAL | Status: DC | PRN
  Administered 2022-08-12: 1000 mL

## 2022-08-12 MED ORDER — TRANEXAMIC ACID-NACL 1000-0.7 MG/100ML-% IV SOLN
INTRAVENOUS | Status: DC | PRN
  Administered 2022-08-12: 1000 mg via INTRAVENOUS

## 2022-08-12 MED ORDER — PIPERACILLIN-TAZOBACTAM 3.375 G IVPB
3.3750 g | Freq: Three times a day (TID) | INTRAVENOUS | Status: DC
  Administered 2022-08-12 – 2022-08-13 (×4): 3.375 g via INTRAVENOUS
  Filled 2022-08-12 (×4): qty 50

## 2022-08-12 MED ORDER — FENTANYL CITRATE (PF) 100 MCG/2ML IJ SOLN
INTRAMUSCULAR | Status: AC
  Filled 2022-08-12: qty 2

## 2022-08-12 MED ORDER — SODIUM CHLORIDE 0.9 % IV SOLN: INTRAVENOUS | Status: DC

## 2022-08-12 SURGICAL SUPPLY — 15 items
CANISTER SUCT 3000ML PPV (MISCELLANEOUS) ×1 IMPLANT
CATH ROBINSON RED A/P 16FR (CATHETERS) ×1 IMPLANT
CNTNR URN SCR LID CUP LEK RST (MISCELLANEOUS) ×1 IMPLANT
CONT SPEC 4OZ STRL OR WHT (MISCELLANEOUS) ×1
GLOVE SURG ORTHO 8.0 STRL STRW (GLOVE) ×1 IMPLANT
GOWN STRL REUS W/ TWL LRG LVL3 (GOWN DISPOSABLE) ×1 IMPLANT
GOWN STRL REUS W/ TWL XL LVL3 (GOWN DISPOSABLE) ×1 IMPLANT
GOWN STRL REUS W/TWL LRG LVL3 (GOWN DISPOSABLE) ×1
GOWN STRL REUS W/TWL XL LVL3 (GOWN DISPOSABLE) ×1
KIT TURNOVER KIT B (KITS) ×1 IMPLANT
PACK VAGINAL MINOR WOMEN LF (CUSTOM PROCEDURE TRAY) ×1 IMPLANT
PAD ABD 8X10 STRL (GAUZE/BANDAGES/DRESSINGS) IMPLANT
PAD OB MATERNITY 4.3X12.25 (PERSONAL CARE ITEMS) ×1 IMPLANT
TOWEL GREEN STERILE FF (TOWEL DISPOSABLE) ×2 IMPLANT
UNDERPAD 30X36 HEAVY ABSORB (UNDERPADS AND DIAPERS) ×1 IMPLANT

## 2022-08-12 NOTE — Op Note (Signed)
Alyssa Howell PROCEDURE DATE: 08/12/2022  PREOPERATIVE DIAGNOSIS: postpartum retained products of conception POSTOPERATIVE DIAGNOSIS: The same PROCEDURE:     Suction Dilation and Curettage SURGEON:  Mariel Aloe, Md  INDICATIONS: 22 y.o. [phone] with retained products of conception and endometritis needing surgical removal.  Risks of surgery were discussed with the patient including but not limited to: bleeding which may require transfusion; infection which may require antibiotics; injury to uterus or surrounding organs; need for additional procedures including laparotomy or laparoscopy; possibility of intrauterine scarring which may impair future fertility; and other postoperative/anesthesia complications. Written informed consent was obtained.    FINDINGS:  A 12 week size uterus, small amounts of possible retained placenta sent to pathology.  ANESTHESIA: General-LMA, paracervical block. INTRAVENOUS FLUIDS:  700 ml of LR ESTIMATED BLOOD LOSS:  300 ml. UOP: 200 ml Albumin 250 ml SPECIMENS:  Products of conception sent to pathology COMPLICATIONS:  None immediate.  PROCEDURE DETAILS:   She was then taken to the operating room where general anesthesia was administered and was found to be adequate.  After an adequate timeout was performed, she was placed in the dorsal lithotomy position and examined; then prepped and draped in the sterile manner.   Her bladder was catheterized for 200 ml of clear, yellow urine. A vaginal speculum was then placed in the patient's vagina and a single tooth tenaculum was applied to the anterior lip of the cervix.   The cervix was gently dilated to accommodate an 8 french  suction curette that was gently advanced to the uterine fundus. Abdominal ultrasound was initiated at this time to image the endometrial stripe.  The suction device was then activated and curette slowly rotated to clear the retained products of conception.  Suction curettage was done until complete  emptying of the uterus was confirmed.  A gentle ultrasound guided sharp curettage was performed and more small particles of tissue were removed.   The endometrial stripe image improved as the tissue was removed.  Pt received 0.2 mg of IM methergine due to moderate bleeding.  The patient remained stable. There was minimal bleeding noted and the tenaculum removed with good hemostasis noted.   All instruments were removed from the patient's vagina.  Sponge and instrument counts were correct times two  The patient tolerated the procedure well and was taken to the recovery area extubated, awake, and in stable condition.  The patient will return to the antepartum unit for continued antibiotics and monitoring.  Mariel Aloe, MD, FACOG Obstetrician & Gynecologist, [facility], [facility]

## 2022-08-12 NOTE — H&P (Signed)
Obstetrics & Gynecology H&P   Date of Admission: 08/12/2022   Requesting Provider: MAU  Primary OBGYN: [facility] Primary Care Provider: Westley Chandler  Reason for Admission: retained POCs  History of Present Illness: Ms. Haff is a 22 y.o. [phone] (Patient's last menstrual period was 10/22/2021 (exact date).), with the above CC. PMHx is significant for nothing  Patient is s/p 4/13 SVD/intact perineum at 34wks due to PPROM IOL. No issues with delivery or third stage reported. Patient presented to MAU from the NICU for pain, bleeding and fevers  Patient tachy to the 120s and low grade temp up to 99.8 and with some bleeding/lochia but not heavy. U/s showed 3.5cm endometrial stripe with vascularity. WBC stable at 15 and Hgb stable at high 9.7 vs 9.0 at last admission. Patient given 1L fluid bolus and HR in the 100s and temp at 99.2  ROS: A 12-point review of systems was performed and negative, except as stated in the above HPI.  OBGYN History: As per HPI. OB History  Gravida Para Term Preterm AB Living  SAB IAB Ectopic Multiple Live Births        0 2    # Outcome Date GA Lbr Len/2nd Weight Sex Delivery Anes PTL Lv  2 Preterm 08/05/22 [redacted]w[redacted]d 26:50 / 00:32 2590 g M Vag-Spont EPI  LIV  1 Term 03/2021 [redacted]w[redacted]d  3585 g M Vag-Spont   LIV     Past Medical History: Past Medical History:  Diagnosis Date   ADHD    Chlamydia 2022   Depression    Kidney stones    Seizures    x 1 in 5th grade after taking ability. No other episodes.   Vesico-ureteral reflux     Past Surgical History: Past Surgical History:  Procedure Laterality Date   CYSTOSCOPY/URETEROSCOPY/HOLMIUM LASER/STENT PLACEMENT Right 08/02/2020   Procedure: CYSTOSCOPY/URETEROSCOPY/HOLMIUM LASER/STENT PLACEMENT;  Surgeon: Vanna Scotland, MD;  Location: [facility] ORS;  Service: Urology;  Laterality: Right;   TONSILLECTOMY      Family History:  Family History  Problem Relation Age of Onset   Hepatitis C Mother     Valvular heart disease Mother    Other Father        no contact in over 66yrs   Cancer Maternal Grandmother    Breast cancer Maternal Grandmother    Diabetes Other    Heart attack Other    Asthma Neg Hx    Heart disease Neg Hx    Hypertension Neg Hx     Social History:  Social History   Socioeconomic History   Marital status: Single    Spouse name: Not on file   Number of children: Not on file   Years of education: Not on file   Highest education level: Not on file  Occupational History   Not on file  Tobacco Use   Smoking status: Never   Smokeless tobacco: Never  Vaping Use   Vaping Use: Never used  Substance and Sexual Activity   Alcohol use: Not Currently   Drug use: Not Currently    Types: Marijuana    Comment: last used the end of September 2022 as of 02/07/2021   Sexual activity: Not Currently    Partners: Male  Other Topics Concern   Not on file  Social History Narrative   From age [age] the patient lived with adoptive parents, grandparents and foster families.  At age 53 her mom gain custody.  Her name is  pronounced [name] .  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  She is not sure what she wants to do with her life.  Sexually active.  Does not use condoms 7 partners in 2020.   Social Determinants of Health   Financial Resource Strain: Not on file  Food Insecurity: No Food Insecurity (08/04/2022)   Hunger Vital Sign    Worried About Running Out of Food in the Last Year: Never true    Ran Out of Food in the Last Year: Never true  Transportation Needs: No Transportation Needs (08/04/2022)   PRAPARE - Administrator, Civil Service (Medical): No    Lack of Transportation (Non-Medical): No  Physical Activity: Not on file  Stress: Not on file  Social Connections: Not on file  Intimate Partner Violence: Not At Risk (08/04/2022)   Humiliation, Afraid, Rape, and Kick questionnaire    Fear of Current or Ex-Partner: No    Emotionally  Abused: No    Physically Abused: No    Sexually Abused: No    Allergy: Allergies  Allergen Reactions   Abilify [Aripiprazole] Other (See Comments)    Seizures    Current Outpatient Medications: Medications Prior to Admission  Medication Sig Dispense Refill Last Dose   ibuprofen (ADVIL) 600 MG tablet Take 1 tablet (600 mg total) by mouth every 6 (six) hours. 30 tablet 0 08/11/2022 at 1700   Prenatal Vit-Fe Fumarate-FA (PRENATAL MULTIVITAMIN) TABS tablet Take 1 tablet by mouth daily at 12 noon. 90 tablet 3 08/11/2022   ascorbic acid (VITAMIN C) 500 MG tablet Take 1 tablet (500 mg total) by mouth every other day. Take with iron pill (Patient not taking: Reported on 07/12/2022) 30 tablet 3    cyclobenzaprine (FLEXERIL) 5 MG tablet Take 1 tablet (5 mg total) by mouth 3 (three) times daily as needed for up to 30 doses for muscle spasms. 30 tablet 0    Magnesium Oxide -Mg Supplement (MAG-OXIDE) 200 MG TABS Take 2 tablets (400 mg total) by mouth at bedtime. If that amount causes loose stools in the am, switch to 200mg  daily at bedtime. 60 tablet 3      Hospital Medications: Current Facility-Administered Medications  Medication Dose Route Frequency Provider Last Rate Last Admin   0.9 %  sodium chloride infusion   Intravenous Continuous [place] Bing, MD       ibuprofen (ADVIL) tablet 600 mg  600 mg Oral Q6H PRN [place] Bing, MD       ibuprofen (ADVIL) tablet 600 mg  600 mg Oral Once [place] Bing, MD       ondansetron [facility]) 4 MG/2ML injection            ondansetron (ZOFRAN) tablet 4 mg  4 mg Oral Q6H PRN [place] Bing, MD       Or   ondansetron (ZOFRAN) injection 4 mg  4 mg Intravenous Q6H PRN [place] Bing, MD       oxyCODONE-acetaminophen (PERCOCET/ROXICET) 5-325 MG per tablet 1-2 tablet  1-2 tablet Oral Q6H PRN [place] Bing, MD       piperacillin-tazobactam (ZOSYN) IVPB 3.375 g  3.375 g Intravenous Q8H [place] Bing, MD         Physical Exam:  Current Vital  Signs 24h Vital Sign Ranges  T 99.2 F (37.3 C) Temp  Avg: 99.3 F (37.4 C)  Min: 99 F (37.2 C)  Max: 99.8 F (37.7 C)  BP 120/79 BP  Min: 116/41  Max: 120/79  HR 100 Pulse  Avg: 118  Min: 100  Max: 129  RR 18 Resp  Avg: 18  Min: 18  Max: 18  SaO2 96 %   SpO2  Avg: 97.1 %  Min: 96 %  Max: 99 %       24 Hour I/O Current Shift I/O  Time Ins Outs No intake/output data recorded. No intake/output data recorded.   Patient Vitals for the past 24 hrs:  BP Temp Temp src Pulse Resp SpO2 Height Weight  08/12/22 0057 120/79 99.2 F (37.3 C) Oral 100 18 -- -- --  08/11/22 2246 (!) 116/41 -- -- (!) 123 -- -- -- --  08/11/22 2245 -- -- -- -- -- 96 % -- --  08/11/22 2240 -- -- -- -- -- 97 % -- --  08/11/22 2235 -- -- -- -- -- 96 % -- --  08/11/22 2230 -- -- -- -- -- 97 % -- --  08/11/22 2225 -- -- -- -- -- 96 % -- --  08/11/22 2220 -- -- -- -- -- 98 % -- --  08/11/22 2215 -- -- -- -- -- 98 % -- --  08/11/22 2208 -- 99.8 F (37.7 C) Axillary -- -- -- -- --  08/11/22 2123 -- -- -- -- -- 99 % -- --  08/11/22 2115 118/69 99 F (37.2 C) Oral (!) 120 18 -- -- --  08/11/22 2055 120/68 99.1 F (37.3 C) Oral (!) 129 18 -- 5\' 6"  (1.676 m) 94 kg    Body mass index is 33.44 kg/m. General appearance: Well nourished, well developed female in no acute distress.  Neck:  Supple, normal appearance, and no thyromegaly  Cardiovascular: S1, S2 normal, no murmur, rub or gallop, regular rate and rhythm Respiratory:  Clear to auscultation bilateral. Normal respiratory effort Abdomen: soft, nd, minimally ttp, no peritoneal s/s Neuro/Psych:  Normal mood and affect.  Skin:  Warm and dry.  Extremities: no clubbing, cyanosis, or edema.  Lymphatic:  No inguinal lymphadenopathy.   Pelvic exam from CNM MAU note Genitourinary:    Vagina: Bleeding present.     Comments: Current pad saturated with brown and streaks of dark red blood (applied 4hours ago)   Laboratory: Recent Labs  Lab 08/06/22 0719  08/11/22 2318  WBC 15.2* 15.8*  HGB 9.0* 9.7*  HCT 26.7* 29.1*  PLT 208 402*   Recent Labs  Lab 08/11/22 2318  NA 139  K 3.7  CL 106  CO2 22  BUN 15  CREATININE 0.77  CALCIUM 9.0  PROT 6.4*  BILITOT 0.4  ALKPHOS 125  ALT 17  AST 16  GLUCOSE 93   No results for input(s): "APTT", "INR", "PTT" in the last 168 hours.  Invalid input(s): "DRHAPTT" Recent Labs  Lab 08/11/22 2318  ABORH A POS    Imaging:  Narrative & Impression  CLINICAL DATA:  Postpartum bleeding and fever.   EXAM: TRANSABDOMINAL AND TRANSVAGINAL ULTRASOUND OF PELVIS   TECHNIQUE: Both transabdominal and transvaginal ultrasound examinations of the pelvis were performed. Transabdominal technique was performed for global imaging of the pelvis including uterus, ovaries, adnexal regions, and pelvic cul-de-sac. It was necessary to proceed with endovaginal exam following the transabdominal exam to visualize the uterus, endometrium, bilateral ovaries and bilateral adnexa.   COMPARISON:  None Available.   FINDINGS: Uterus   Measurements: 13.4 cm x 7.8 cm x 9.7 cm = volume: 34.3 mL. No fibroids or other mass visualized.   Endometrium   Thickness: 34.3 mm. The endometrium  is heterogeneous in appearance with endometrial vascularity noted on color Doppler evaluation.   Right ovary   Measurements: 4.2 cm x 1.7 cm x 1.6 cm = volume: 6.0 mL. Normal appearance/no adnexal mass.   Left ovary   Measurements: 5.6 cm x 1.9 cm x 2.9 cm = volume: 16.5 mL. Normal appearance/no adnexal mass.   Other findings   A trace amount of pelvic free fluid is seen.   IMPRESSION: Thickened endometrium with additional findings consistent with retained products of conception.     Electronically Signed   By: Aram Candela M.D.   On: 08/12/2022 00:22      Assessment: Ms. Polzin is a 22 y.o. [phone] with retained POCs; pt stable Plan: Zosyn ordered and recommend suction d&c later today and she is amenable  to plan; patient tentatively posted for 1030 with Dr. Donavan Foil NPO except meds, MIVF, SCDs, bedrest with bathroom privileges. Lactation consulted since she is breast feeding.   Total time taking care of the patient was 30 minutes, with greater than 50% of the time spent in face to face interaction with the patient.  Cornelia Copa MD Attending Center for [facility] GYN Consult Phone: [phone] (M-F, 0800-1700) & [phone] (Off hours, weekends, holidays)

## 2022-08-12 NOTE — Transfer of Care (Signed)
Immediate Anesthesia Transfer of Care Note  Patient: Alyssa Howell  Procedure(s) Performed: DILATATION AND CURETTAGE, ULTRASOUND GUIDED  Patient Location: PACU  Anesthesia Type:MAC  Level of Consciousness: drowsy and patient cooperative  Airway & Oxygen Therapy: Patient Spontanous Breathing and Patient connected to nasal cannula oxygen  Post-op Assessment: Report given to RN and Post -op Vital signs reviewed and stable  Post vital signs: Reviewed and stable  Last Vitals:  Vitals Value Taken Time  BP 105/68 08/12/22 1139  Temp    Pulse 81 08/12/22 1143  Resp 19 08/12/22 1143  SpO2 97 % 08/12/22 1143  Vitals shown include unvalidated device data.  Last Pain:  Vitals:   08/12/22 1022  TempSrc:   PainSc: 9       Patients Stated Pain Goal: 3 (08/12/22 0900)  Complications: No notable events documented.

## 2022-08-12 NOTE — Progress Notes (Signed)
Pharmacy Antibiotic Note  Alyssa Howell is a 22 y.o. female admitted on 08/11/2022 with sepsis.  Pharmacy has been consulted for zosyn dosing.  Vaginal delivery 7 days ago now with vaginal bleeding, passing golf ball size clots, abdominal pain, tachycardic, and febrile.   Plan: Zosyn 3.375g IV q8h (4 hour infusion).  Height:  (167.6 cm) Weight: 94 kg (207 lb 3.2 oz) IBW/kg (Calculated) : 59.3  Temp (24hrs), Avg:99.3 F (37.4 C), Min:99 F (37.2 C), Max:99.8 F (37.7 C)  Recent Labs  Lab 08/06/22 0719 08/11/22 2318  WBC 15.2* 15.8*  CREATININE  --  0.77    Estimated Creatinine Clearance: 128.5 mL/min (by C-G formula based on SCr of 0.77 mg/dL).    Allergies  Allergen Reactions   Abilify [Aripiprazole] Other (See Comments)    Seizures     Thank you for allowing pharmacy to be a part of this patient's care.  Loyola Mast 08/12/2022 1:06 AM

## 2022-08-12 NOTE — MAU Note (Deleted)
Hot pack provided to pt for pain mid R back that she says now radiates around to her uterus. Dorathy Daft and Edd Arbour CNM notified

## 2022-08-12 NOTE — Anesthesia Preprocedure Evaluation (Signed)
Anesthesia Evaluation  Patient identified by MRN, date of birth, ID band Patient awake    Reviewed: Allergy & Precautions, NPO status , Patient's Chart, lab work & pertinent test results  Airway Mallampati: III  TM Distance: >3 FB Neck ROM: Full    Dental  (+) Teeth Intact, Dental Advisory Given   Pulmonary neg pulmonary ROS   breath sounds clear to auscultation       Cardiovascular  Rhythm:Regular Rate:Normal     Neuro/Psych Seizures -, Well Controlled,  PSYCHIATRIC DISORDERS  Depression       GI/Hepatic Neg liver ROS,GERD  ,,  Endo/Other  negative endocrine ROS    Renal/GU Renal disease     Musculoskeletal negative musculoskeletal ROS (+)    Abdominal   Peds  Hematology negative hematology ROS (+)   Anesthesia Other Findings   Reproductive/Obstetrics                             Anesthesia Physical Anesthesia Plan  ASA: 2  Anesthesia Plan: MAC   Post-op Pain Management: Minimal or no pain anticipated   Induction: Intravenous  PONV Risk Score and Plan: 0 and Propofol infusion  Airway Management Planned: Natural Airway and Simple Face Mask  Additional Equipment: None  Intra-op Plan:   Post-operative Plan:   Informed Consent: I have reviewed the patients History and Physical, chart, labs and discussed the procedure including the risks, benefits and alternatives for the proposed anesthesia with the patient or authorized representative who has indicated his/her understanding and acceptance.       Plan Discussed with: CRNA  Anesthesia Plan Comments:        Anesthesia Quick Evaluation

## 2022-08-12 NOTE — Lactation Note (Signed)
This note was copied from a baby's chart.  NICU Lactation Consultation Note  Patient Name: Alyssa Howell [id number] Date: 08/12/2022 Age:22 years  Reason for consult: Follow-up assessment; NICU baby; 1st time breastfeeding; Late-preterm 34-36.6wks  SUBJECTIVE  LC visited with P2 Mom of LPTI that was recently readmitted to [facility] due to pain and vaginal bleeding.  Ultrasound revealed retained placenta and Mom about to go to OR for D&C.    LC provided Mom with milk labels and washed all the pump parts for Mom.  Breasts are not engorged as Mom has been consistently pumping and expressing 120 ml per session.    Reminded Mom to ask for help with pumping and washing of pump parts upon return to room after her surgery.  OBJECTIVE Infant data: Mother's Current Feeding Choice: Breast Milk and Donor Milk  Infant feeding assessment Scale for Readiness: 2   Maternal data: G2P1102  Vaginal, Spontaneous Pumping frequency: Every 3 hrs Pumped volume: 120 mL Flange Size: 21  WIC Program: Yes WIC Referral Sent?: Yes What county?: [place] Pump: WIC Pump  ASSESSMENT Infant: Feeding Status: Scheduled 9-12-3-6  Maternal: Milk volume: Normal  INTERVENTIONS/PLAN Interventions: Interventions: Breast feeding basics reviewed; Skin to skin; Breast massage; Hand express; DEBP Tools: Pump; Flanges; Hands-free pumping top Pump Education: Setup, frequency, and cleaning; Milk Storage  Plan: Consult Status: NICU follow-up NICU Follow-up type: Weekly NICU follow up   [name] 08/12/2022, 10:03 AM

## 2022-08-12 NOTE — MAU Note (Signed)
Pt reports ultrasound caused abdominal pain to become more severe-rates pain at 8 on 0-10 scale-Shay Suzie Portela CNM updated.Pt sitting up using breast pump. Pt states she feels that her "fever broke" reports waking up with hospital gown soaked with sweat. New gown provided

## 2022-08-12 NOTE — Anesthesia Procedure Notes (Signed)
Procedure Name: MAC Date/Time: 08/12/2022 10:50 AM  Performed by: Adria Dill, CRNAPre-anesthesia Checklist: Patient identified, Emergency Drugs available, Suction available and Patient being monitored Patient Re-evaluated:Patient Re-evaluated prior to induction Oxygen Delivery Method: Nasal cannula Preoxygenation: Pre-oxygenation with 100% oxygen Induction Type: IV induction Placement Confirmation: positive ETCO2 and breath sounds checked- equal and bilateral Dental Injury: Teeth and Oropharynx as per pre-operative assessment

## 2022-08-12 NOTE — Anesthesia Postprocedure Evaluation (Signed)
Anesthesia Post Note  Patient: Alyssa Howell  Procedure(s) Performed: DILATATION AND CURETTAGE, ULTRASOUND GUIDED     Patient location during evaluation: PACU Anesthesia Type: MAC Level of consciousness: awake and alert Pain management: pain level controlled Vital Signs Assessment: post-procedure vital signs reviewed and stable Respiratory status: spontaneous breathing, nonlabored ventilation, respiratory function stable and patient connected to nasal cannula oxygen Cardiovascular status: stable and blood pressure returned to baseline Postop Assessment: no apparent nausea or vomiting Anesthetic complications: no  No notable events documented.  Last Vitals:  Vitals:   08/12/22 1215 08/12/22 1236  BP: 110/71 108/75  Pulse: 69 64  Resp: 15 16  Temp: (!) 36.4 C 36.6 C  SpO2: 100% 100%    Last Pain:  Vitals:   08/12/22 1236  TempSrc: Oral  PainSc:                  [name]

## 2022-08-12 NOTE — MAU Note (Signed)
Pt up to bathroom-reported pad and panties saturated with red blood - denies clots- pad had been unchanged since 1930.

## 2022-08-12 NOTE — Progress Notes (Signed)
Gynecology Progress Note  Admission Date: 08/11/2022 Current Date: 08/12/2022 8:52 AM  Alyssa Howell is a 22 y.o. 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  NPO status observed.  Subjective:  Pt seen doing well.  She still notes some abdominal soreness to touch, but denies fever and chills.  Discussed ultrasound guided dilation and curettage in detail with the patient.  Risks and benefits given including bleeding, infection, involvement of other organs as well as uterine perforation.  Consent has been signed and patient awaits procedure.  Objective:   Vitals:   08/12/22 0250 08/12/22 0315 08/12/22 0410 08/12/22 0726  BP: 128/71 112/68 128/70 111/74  Pulse: 84 65 78 80  Resp: 18 16 17 18   Temp: 97.9 F (36.6 C)  97.9 F (36.6 C) 97.7 F (36.5 C)  TempSrc: Oral  Oral Oral  SpO2: 99% 97% 98% 99%  Weight:      Height:        Temp:  [97.7 F (36.5 C)-99.8 F (37.7 C)] 97.7 F (36.5 C) (04/20 0726) Pulse Rate:  [65-129] 80 (04/20 0726) Resp:  [16-18] 18 (04/20 0726) BP: (111-128)/(41-79) 111/74  (04/20 0726) SpO2:  [96 %-99 %] 99 % (04/20 0726) Weight:  [94 kg] 94 kg (04/19 2055) No intake/output data recorded. No intake/output data recorded. No intake or output data in the 24 hours ending 08/12/22 0852   Current Vital Signs 24h Vital Sign Ranges  T 97.7 F (36.5 C) Temp  Avg: 98.7 F (37.1 C)  Min: 97.7 F (36.5 C)  Max: 99.8 F (37.7 C)  BP 111/74 BP  Min: 111/74  Max: 128/70  HR 80 Pulse  Avg: 97.4  Min: 65  Max: 129  RR 18 Resp  Avg: 17.5  Min: 16  Max: 18  SaO2 99 % Room Air SpO2  Avg: 97.6 %  Min: 96 %  Max: 99 %       24 Hour I/O Current Shift I/O  Time Ins Outs No intake/output data recorded. No intake/output data recorded.   Patient Vitals for the past 12 hrs:  BP Temp Temp src Pulse Resp SpO2 Height Weight  08/12/22 0726 111/74 97.7 F (36.5 C) Oral 80 18 99 % -- --  08/12/22 0410 128/70 97.9 F (36.6 C) Oral 78 17 98 % -- --  08/12/22 0315 112/68 -- -- 65 16 97 % -- --  08/12/22 0250 128/71 97.9 F (36.6 C) Oral 84 18 99 % -- --  08/12/22 0100 -- -- -- -- -- 99 % -- --  08/12/22 0057 120/79 99.2 F (37.3 C) Oral 100 18 -- -- --  08/11/22 2246 (!) 116/41 -- -- (!) 123 17 -- -- --  08/11/22 2245 -- -- -- -- -- 96 % -- --  08/11/22 2240 -- -- -- -- -- 97 % -- --  08/11/22 2235 -- -- -- -- -- 96 % -- --  08/11/22 2230 -- -- -- -- -- 97 % -- --  08/11/22 2225 -- -- -- -- -- 96 % -- --  08/11/22 2220 -- -- -- -- -- 98 % -- --  08/11/22 2215 -- -- -- -- -- 98 % -- --  08/11/22 2208 -- 99.8 F (37.7 C) Axillary -- -- -- -- --  08/11/22 2123 -- -- -- -- -- 99 % -- --  08/11/22 2115 118/69 99 F (37.2 C) Oral (!) 120 18 -- -- --  08/11/22 2055 120/68 99.1 F (37.3 C) Oral (!) 129 18 --  (1.676 m) 94 kg     Patient Vitals for the past 24 hrs:  BP Temp Temp src Pulse Resp SpO2 Height Weight  08/12/22 0726 111/74 97.7 F (36.5 C) Oral 80 18 99 % -- --  08/12/22 0410 128/70 97.9 F (36.6 C) Oral 78 17 98 % -- --  08/12/22 0315 112/68 -- -- 65 16  97 % -- --  08/12/22 0250 128/71 97.9 F (36.6 C) Oral 84 18 99 % -- --  08/12/22 0100 -- -- -- -- -- 99 % -- --  08/12/22 0057 120/79 99.2 F (37.3 C) Oral 100 18 -- -- --  08/11/22 2246 (!) 116/41 -- -- (!) 123 17 -- -- --  08/11/22 2245 -- -- -- -- -- 96 % -- --  08/11/22 2240 -- -- -- -- -- 97 % -- --  08/11/22 2235 -- -- -- -- -- 96 % -- --  08/11/22 2230 -- -- -- -- -- 97 % -- --  08/11/22 2225 -- -- -- -- -- 96 % -- --  08/11/22 2220 -- -- -- -- -- 98 % -- --  08/11/22 2215 -- -- -- -- -- 98 % -- --  08/11/22 2208 -- 99.8 F (37.7 C) Axillary -- -- -- -- --  08/11/22 2123 -- -- -- -- -- 99 % -- --  08/11/22 2115 118/69 99 F (37.2 C) Oral (!) 120 18 -- -- --  08/11/22 2055 120/68 99.1 F (37.3 C) Oral (!) 129 18 --  (1.676 m) 94 kg    Physical exam: General appearance: alert, cooperative, appears stated age, and no distress Abdomen: abnormal findings:  moderate tenderness in the lower abdomen GU: No gross VB Lungs: clear to auscultation bilaterally Heart: regular rate and rhythm Extremities: no lower extremity edema Skin: WNL, cool to touch, no diaphoresis Psych: appropriate Neurologic: Grossly normal  Medications Current Facility-Administered Medications  Medication Dose Route Frequency Provider Last Rate Last Admin   0.9 %  sodium chloride infusion   Intravenous Continuous [place] Bing, MD 125 mL/hr at 08/12/22 0148 New Bag at 08/12/22 0148   ibuprofen (ADVIL) tablet 600 mg  600 mg Oral Q6H PRN [place] Bing, MD   600 mg at 08/12/22 0150   ondansetron (ZOFRAN) 4 MG/2ML injection            ondansetron (ZOFRAN) tablet 4 mg  4 mg Oral Q6H PRN [place] Bing, MD       Or   ondansetron (ZOFRAN) injection 4 mg  4 mg Intravenous Q6H PRN [place] Bing, MD       oxyCODONE-acetaminophen (PERCOCET/ROXICET) 5-325 MG per tablet 1-2 tablet  1-2 tablet Oral Q6H PRN [place],  Billey Gosling, MD   1 tablet at 08/12/22 0600   piperacillin-tazobactam (ZOSYN) IVPB 3.375 g   3.375 g Intravenous Leonor Liv, MD 12.5 mL/hr at 08/12/22 0149 3.375 g at 08/12/22 0149      Labs  Recent Labs  Lab 08/06/22 0719 08/11/22 2318  WBC 15.2* 15.8*  HGB 9.0* 9.7*  HCT 26.7* 29.1*  PLT 208 402*    Recent Labs  Lab 08/11/22 2318  NA 139  K 3.7  CL 106  CO2 22  BUN 15  CREATININE 0.77  CALCIUM 9.0  PROT 6.4*  BILITOT 0.4  ALKPHOS 125  ALT 17  AST 16  GLUCOSE 93    Radiology Previous ultrasound reviewed which showed thickened endometrium with blood flow  Assessment & Plan:  Retained products of conception Pt is scheduled for dilation and curettage to remove POC and possible nidus of infection.  Continue IV zosyn post procedure. If WBC and pain improved post procedure consider potential d/c on 08/13/22  Code Status: Full Code  Total time taking care of the patient was 20 minutes, with greater than 50% of the time spent in face to face interaction with the patient.  Mariel Aloe, MD Attending Center for [facility])

## 2022-08-13 ENCOUNTER — Encounter (HOSPITAL_COMMUNITY): Payer: Self-pay | Admitting: Obstetrics and Gynecology

## 2022-08-13 DIAGNOSIS — D62 Acute posthemorrhagic anemia: Secondary | ICD-10-CM | POA: Insufficient documentation

## 2022-08-13 LAB — CBC WITH DIFFERENTIAL/PLATELET
Abs Immature Granulocytes: 0.08 10*3/uL — ABNORMAL HIGH (ref 0.00–0.07)
Basophils Absolute: 0.1 10*3/uL (ref 0.0–0.1)
Basophils Relative: 1 %
Eosinophils Absolute: 0.6 10*3/uL — ABNORMAL HIGH (ref 0.0–0.5)
Eosinophils Relative: 6 %
HCT: 25.8 % — ABNORMAL LOW (ref 36.0–46.0)
Hemoglobin: 8.4 g/dL — ABNORMAL LOW (ref 12.0–15.0)
Immature Granulocytes: 1 %
Lymphocytes Relative: 20 %
Lymphs Abs: 2.2 10*3/uL (ref 0.7–4.0)
MCH: 30.9 pg (ref 26.0–34.0)
MCHC: 32.6 g/dL (ref 30.0–36.0)
MCV: 94.9 fL (ref 80.0–100.0)
Monocytes Absolute: 0.9 10*3/uL (ref 0.1–1.0)
Monocytes Relative: 8 %
Neutro Abs: 7.1 10*3/uL (ref 1.7–7.7)
Neutrophils Relative %: 64 %
Platelets: 349 10*3/uL (ref 150–400)
RBC: 2.72 MIL/uL — ABNORMAL LOW (ref 3.87–5.11)
RDW: 13.1 % (ref 11.5–15.5)
WBC: 10.9 10*3/uL — ABNORMAL HIGH (ref 4.0–10.5)
nRBC: 0 % (ref 0.0–0.2)

## 2022-08-13 MED ORDER — FERROUS GLUCONATE 324 (38 FE) MG PO TABS: 324.0000 mg | ORAL_TABLET | ORAL | 3 refills | Status: DC

## 2022-08-13 MED ORDER — OXYCODONE-ACETAMINOPHEN 5-325 MG PO TABS: 1.0000 | ORAL_TABLET | Freq: Four times a day (QID) | ORAL | 0 refills | Status: DC | PRN

## 2022-08-13 MED ORDER — FERROUS GLUCONATE 324 (38 FE) MG PO TABS: 324.0000 mg | ORAL_TABLET | ORAL | Status: DC

## 2022-08-13 MED ORDER — IBUPROFEN 600 MG PO TABS: 600.0000 mg | ORAL_TABLET | Freq: Four times a day (QID) | ORAL | 2 refills | Status: DC | PRN

## 2022-08-13 MED ORDER — DOCUSATE SODIUM 100 MG PO CAPS: 100.0000 mg | ORAL_CAPSULE | Freq: Two times a day (BID) | ORAL | Status: DC

## 2022-08-13 NOTE — Discharge Summary (Signed)
Discharge Summary   Admit Date: 08/11/2022 Discharge Date: 08/13/2022 Discharging Service: OBGYN  Primary OBGYN: [facility] Admitting Physician: Paulina Bing, MD  Discharge Physician: Mariel Aloe, MD  Referring Provider: n/a  Primary Care Provider: Westley Chandler, MD  Admission Diagnoses: Retained placenta after delivery without hemorrhage but with other complication [O73.0]   Discharge Diagnoses: Same  Consult Orders: CONSULT TO LACTATION   Surgeries/Procedures Performed: Ultrasound guided suction dilation and curettage  Hospital Course: Pt was admitted 7 days postpartum with tachycardia and low grade temperature.  Pt had also noted increased bleeding.  Pelvic ultrasound showed increased endometrial stripe with increased vascularity.  Pt underwent ultrasound guided D and C to remove the retained products.  Recovery has been uncomplicated and patient was discharged home on POD 1  Discharge Exam:  Vitals:   08/12/22 2003 08/13/22 0114 08/13/22 0415 08/13/22 0817  BP: 118/63 122/64 105/61 116/75  Pulse: 87 80 95 98  Resp: 16 17 18    Temp: 97.7 F (36.5 C) 98 F (36.7 C) 97.9 F (36.6 C) 98.9 F (37.2 C)  TempSrc: Oral Oral Oral Oral  SpO2: 100% 100% 97% 100%  Weight:      Height:        Temp:  [97.5 F (36.4 C)-98.9 F (37.2 C)] 98.9 F (37.2 C) (04/21 0817) Pulse Rate:  [57-99] 98 (04/21 0817) Resp:  [11-21] 18 (04/21 0415) BP: (105-128)/(61-75) 116/75 (04/21 0817) SpO2:  [93 %-100 %] 100 % (04/21 0817) Weight:  [93.9 kg] 93.9 kg (04/20 1015) I/O last 3 completed shifts: In: 1150 [I.V.:700; IV Piggyback:450] Out: 1550 [Urine:1250; Blood:300] No intake/output data recorded.  Intake/Output Summary (Last 24 hours) at 08/13/2022 1012 Last data filed at 08/12/2022 2015 Gross per 24 hour  Intake 1150 ml  Output 1550 ml  Net -400 ml     Current Vital Signs 24h Vital Sign Ranges  T 98.9 F (37.2 C) Temp  Avg: 98 F (36.7 C)  Min:  97.5 F (36.4 C)  Max: 98.9 F (37.2 C)  BP 116/75 BP  Min: 105/61  Max: 128/72  HR 98 Pulse  Avg: 74.2  Min: 57  Max: 99  RR 18 Resp  Avg: 16.6  Min: 11  Max: 21  SaO2 100 % Room Air SpO2  Avg: 97.2 %  Min: 93 %  Max: 100 %       24 Hour I/O Current Shift I/O  Time Ins Outs 04/20 0701 - 04/21 0700 In: 1150 [I.V.:700] Out: 1550 [Urine:1250] No intake/output data recorded.   Patient Vitals for the past 12 hrs:  BP Temp Temp src Pulse Resp SpO2  08/13/22 0817 116/75 98.9 F (37.2 C) Oral 98 -- 100 %  08/13/22 0415 105/61 97.9 F (36.6 C) Oral 95 18 97 %  08/13/22 0114 122/64 98 F (36.7 C) Oral 80 17 100 %    Patient Vitals for the past 6 hrs:  BP Temp Temp src Pulse Resp SpO2  08/13/22 0817 116/75 98.9 F (37.2 C) Oral 98 -- 100 %  08/13/22 0415 105/61 97.9 F (36.6 C) Oral 95 18 97 %     Patient Vitals for the past 24 hrs:  BP Temp Temp src Pulse Resp SpO2 Height Weight  08/13/22 0817 116/75 98.9 F (37.2 C) Oral 98 -- 100 % -- --  08/13/22 0415 105/61 97.9 F (36.6 C) Oral 95 18 97 % -- --  08/13/22 0114 122/64 98 F (36.7 C) Oral 80 17 100 % -- --  08/12/22 2003 118/63 97.7 F (36.5 C) Oral 87 16 100 % -- --  08/12/22 1551 128/72 98.3 F (36.8 C) Oral 93 16 99 % -- --  08/12/22 1325 115/63 -- -- 78 17 -- -- --  08/12/22 1236 108/75 97.9 F (36.6 C) Oral 64 16 100 % -- --  08/12/22 1219 -- -- -- 75 16 98 % -- --  08/12/22 1218 -- -- -- 72 18 96 % -- --  08/12/22 1217 -- -- -- 75 (!) 21 97 % -- --  08/12/22 1216 -- -- -- 64 15 96 % -- --  08/12/22 1215 110/71 (!) 97.5 F (36.4 C) -- 69 15 100 % -- --  08/12/22 1213 -- -- -- 68 11 99 % -- --  08/12/22 1212 -- -- -- 66 15 99 % -- --  08/12/22 1211 -- -- -- 63 16 97 % -- --  08/12/22 1210 -- -- -- 62 15 96 % -- --  08/12/22 1209 -- -- -- (!) 57 19 97 % -- --  08/12/22 1208 -- -- -- 62 19 97 % -- --  08/12/22 1207 -- -- -- 63 16 97 % -- --  08/12/22 1206 -- -- -- (!) 59 17 97 % -- --  08/12/22 1205 -- -- --  (!) 58 13 97 % -- --  08/12/22 1204 -- -- -- 75 17 95 % -- --  08/12/22 1203 -- -- -- 77 15 93 % -- --  08/12/22 1202 -- -- -- 89 20 95 % -- --  08/12/22 1201 -- -- -- 69 11 94 % -- --  08/12/22 1200 119/71 -- -- 96 (!) 21 95 % -- --  08/12/22 1145 112/61 -- -- 99 (!) 21 97 % -- --  08/12/22 1139 105/68 97.7 F (36.5 C) -- -- -- -- -- --  08/12/22 1015 113/62 97.7 F (36.5 C) -- 64 17 96 %  (1.676 m) 93.9 kg       Latest Ref Rng & Units 08/13/2022    5:00 AM 08/11/2022   11:18 PM 08/06/2022    7:19 AM  CBC  WBC 4.0 - 10.5 K/uL 10.9  15.8  15.2   Hemoglobin 12.0 - 15.0 g/dL 8.4  9.7  9.0   Hematocrit 36.0 - 46.0 % 25.8  29.1  26.7   Platelets 150 - 400 K/uL 349  402  208      General appearance: Well nourished, well developed female in no acute distress.  Neck:  Supple, normal appearance, and no thyromegaly  Cardiovascular: regular rate and rhythm Respiratory:  Clear to auscultation bilateral. Normal respiratory effort Abdomen: positive bowel sounds and no masses, hernias; mildly tender lower abdomen to palpation, non distended Breasts: not examined. Neuro/Psych:  Normal mood and affect.  Skin:  Warm and dry.  Lymphatic:  No inguinal lymphadenopathy.   Pelvic exam: deferred  Discharge Disposition:  Home   Discharge Medications: Allergies as of 08/13/2022       Reactions   Abilify [aripiprazole] Other (See Comments)   Seizures        Medication List     STOP taking these medications    ascorbic acid 500 MG tablet Commonly known as: VITAMIN C   cyclobenzaprine 5 MG tablet Commonly known as: FLEXERIL   Mag-Oxide 200 MG Tabs Generic drug: Magnesium Oxide -Mg Supplement       TAKE these medications    ferrous gluconate 324 MG tablet Commonly known as:  FERGON Take 1 tablet (324 mg total) by mouth every other day. Start taking on: August 14, 2022   ibuprofen 600 MG tablet Commonly known as: ADVIL Take 1 tablet (600 mg total) by mouth every 6 (six)  hours as needed (mild pain). What changed:  when to take this reasons to take this   oxyCODONE-acetaminophen 5-325 MG tablet Commonly known as: PERCOCET/ROXICET Take 1-2 tablets by mouth every 6 (six) hours as needed (moderate to severe pain).   prenatal multivitamin Tabs tablet Take 1 tablet by mouth daily at 12 noon.         Future Appointments  Date Time Provider Department Center  08/21/2022  1:00 PM Gwyndolyn Saxon, [place] [facility] None  09/22/2022 10:15 AM Hermina Staggers, [place] [facility] None   F/u in 1-2 weeks at [facility], MD Attending [facility])

## 2022-08-13 NOTE — Plan of Care (Signed)
Discharged home with AVS and instructions. Verbalize understading

## 2022-08-14 ENCOUNTER — Ambulatory Visit: Payer: Medicaid Other | Admitting: Physical Therapy

## 2022-08-15 LAB — SURGICAL PATHOLOGY

## 2022-08-16 ENCOUNTER — Telehealth: Payer: Self-pay

## 2022-08-16 ENCOUNTER — Telehealth (HOSPITAL_COMMUNITY): Payer: Self-pay | Admitting: *Deleted

## 2022-08-16 ENCOUNTER — Inpatient Hospital Stay (HOSPITAL_COMMUNITY)
Admission: AD | Admit: 2022-08-16 | Discharge: 2022-08-16 | Disposition: A | Discharge status: Home / Self Care | Payer: Medicaid Other | Attending: Obstetrics & Gynecology | Admitting: Obstetrics & Gynecology

## 2022-08-16 ENCOUNTER — Encounter (HOSPITAL_COMMUNITY): Payer: Self-pay | Admitting: Obstetrics & Gynecology

## 2022-08-16 DIAGNOSIS — N939 Abnormal uterine and vaginal bleeding, unspecified: Secondary | ICD-10-CM | POA: Insufficient documentation

## 2022-08-16 DIAGNOSIS — R109 Unspecified abdominal pain: Secondary | ICD-10-CM | POA: Insufficient documentation

## 2022-08-16 DIAGNOSIS — O99019 Anemia complicating pregnancy, unspecified trimester: Secondary | ICD-10-CM

## 2022-08-16 DIAGNOSIS — O9089 Other complications of the puerperium, not elsewhere classified: Secondary | ICD-10-CM | POA: Diagnosis not present

## 2022-08-16 DIAGNOSIS — R42 Dizziness and giddiness: Secondary | ICD-10-CM | POA: Diagnosis not present

## 2022-08-16 LAB — COMPREHENSIVE METABOLIC PANEL
ALT: 13 U/L (ref 0–44)
AST: 20 U/L (ref 15–41)
Albumin: 3 g/dL — ABNORMAL LOW (ref 3.5–5.0)
Alkaline Phosphatase: 105 U/L (ref 38–126)
Anion gap: 11 (ref 5–15)
BUN: 12 mg/dL (ref 6–20)
CO2: 24 mmol/L (ref 22–32)
Calcium: 9.6 mg/dL (ref 8.9–10.3)
Chloride: 105 mmol/L (ref 98–111)
Creatinine, Ser: 0.76 mg/dL (ref 0.44–1.00)
GFR, Estimated: >60 mL/min (ref >60)
Glucose, Bld: 84 mg/dL (ref 70–99)
Potassium: 4.1 mmol/L (ref 3.5–5.1)
Sodium: 140 mmol/L (ref 135–145)
Total Bilirubin: <0.1 mg/dL — ABNORMAL LOW (ref 0.3–1.2)
Total Protein: 6.7 g/dL (ref 6.5–8.1)

## 2022-08-16 LAB — CBC
HCT: 30 % — ABNORMAL LOW (ref 36.0–46.0)
Hemoglobin: 9.4 g/dL — ABNORMAL LOW (ref 12.0–15.0)
MCH: 30.3 pg (ref 26.0–34.0)
MCHC: 31.3 g/dL (ref 30.0–36.0)
MCV: 96.8 fL (ref 80.0–100.0)
Platelets: 530 10*3/uL — ABNORMAL HIGH (ref 150–400)
RBC: 3.1 MIL/uL — ABNORMAL LOW (ref 3.87–5.11)
RDW: 12.9 % (ref 11.5–15.5)
WBC: 10.5 10*3/uL (ref 4.0–10.5)
nRBC: 0 % (ref 0.0–0.2)

## 2022-08-16 LAB — RAPID URINE DRUG SCREEN, HOSP PERFORMED
Amphetamines: NOT DETECTED
Barbiturates: NOT DETECTED
Benzodiazepines: NOT DETECTED
Cocaine: NOT DETECTED
Opiates: NOT DETECTED
Tetrahydrocannabinol: NOT DETECTED

## 2022-08-16 MED ORDER — ACETAMINOPHEN-CAFFEINE 500-65 MG PO TABS
2.0000 | ORAL_TABLET | Freq: Once | ORAL | Status: AC
  Administered 2022-08-16: 2 via ORAL
  Filled 2022-08-16: qty 2

## 2022-08-16 NOTE — MAU Note (Signed)
Alyssa Howell is a 22 y.o. at Unknown here in MAU reporting: (hx preterm delivery 4/13 PPROM, had D&C on 4/20 for retained POC0.  Is feeling really dizzy, appears pale.  States is changing soaked pads almost hourly still.   Having really bad RUQ pain, and chest pain (rt side), has a HA, is taking her Ibuprofen, just not going away. Vision is blurred and seeing black dots. No issues with swelling. Pt is on iron.  Onset of complaint: started today, HA for 2 days Pain score: HA 10; abd 7, chest 7 Vitals:   08/16/22 1138 08/16/22 1142  BP:  121/77  Pulse:  99  Resp:  16  Temp:  98.4 F (36.9 C)  SpO2: 99% 99%      Lab orders placed from triage:

## 2022-08-16 NOTE — MAU Provider Note (Signed)
History     CSN: [id number]  Arrival date and time: 08/16/22 1126   Event Date/Time   First Provider Initiated Contact with Patient 08/16/22 1201      Chief Complaint  Patient presents with   Dizziness    Visual changes    visual changes   Abdominal Pain   Chest Pain   HPI Alyssa Howell is a 22 y.o. [id number] postpartum and post-op patient. She is s/p preterm vaginal birth on 08/05/2022. She is s/p D&C for retained POC on 08/12/2022. She presents to MAU today with chief complaint of heavy vaginal bleeding. Patient reports donning a pad at around 0230 this morning. When she woke up 3 hours later she had saturated her clothing and her bedding. She visualized small clots at that time. On arrival to MAU her bleeding is scant.  Patient also c/o persistent headache. New onset yesterday. She has attempted management with Tylenol, last taken early this morning but has not experienced relief. She reports anterior bilateral pain with pain score 10/10 on arrival to MAU.  Patient also c/o vomiting. This occurred first thing this morning. She was able to tolerate a sandwich afterwards. She is not feeling nauseated on arrival to MAU.  Patient also c/o visual changes, generalized chest pain and abdominal cramping. These are all new onset complaints which she noticed while driving her partner to work around 0530 this morning.  She denies SOB, palpitations, weakness and syncope.   OB History     Gravida  2   Para  2   Term  1   Preterm  1   AB      Living  2      SAB      IAB      Ectopic      Multiple  0   Live Births  2           Past Medical History:  Diagnosis Date   ADHD    Anemia    Chlamydia 2022   Depression    Kidney stones    Seizures    x 1 in 5th grade after taking ability. No other episodes.   Vesico-ureteral reflux     Past Surgical History:  Procedure Laterality Date   CYSTOSCOPY/URETEROSCOPY/HOLMIUM LASER/STENT PLACEMENT Right 08/02/2020    Procedure: CYSTOSCOPY/URETEROSCOPY/HOLMIUM LASER/STENT PLACEMENT;  Surgeon: Vanna Scotland, MD;  Location: [facility] ORS;  Service: Urology;  Laterality: Right;   DILATION AND CURETTAGE OF UTERUS N/A 08/12/2022   Procedure: DILATATION AND CURETTAGE, ULTRASOUND GUIDED;  Surgeon: Warden Fillers, MD;  Location: [facility];  Service: Gynecology;  Laterality: N/A;   FOOT SURGERY Bilateral    metal screws in toes,2020   TONSILLECTOMY      Family History  Problem Relation Age of Onset   Hepatitis C Mother    Valvular heart disease Mother    Other Father        no contact in over 31yrs   Cancer Maternal Grandmother    Breast cancer Maternal Grandmother    Diabetes Other    Heart attack Other    Asthma Neg Hx    Heart disease Neg Hx    Hypertension Neg Hx     Social History   Tobacco Use   Smoking status: Never   Smokeless tobacco: Never  Vaping Use   Vaping Use: Never used  Substance Use Topics   Alcohol use: Not Currently   Drug use: Not Currently    Types: Marijuana  Comment: last used the end of September 2022 as of 02/07/2021    Allergies:  Allergies  Allergen Reactions   Abilify [Aripiprazole] Other (See Comments)    Seizures    Medications Prior to Admission  Medication Sig Dispense Refill Last Dose   ferrous gluconate (FERGON) 324 MG tablet Take 1 tablet (324 mg total) by mouth every other day. 30 tablet 3 08/15/2022   ibuprofen (ADVIL) 600 MG tablet Take 1 tablet (600 mg total) by mouth every 6 (six) hours as needed (mild pain). 30 tablet 2 08/16/2022 at 0800   oxyCODONE-acetaminophen (PERCOCET/ROXICET) 5-325 MG tablet Take 1-2 tablets by mouth every 6 (six) hours as needed (moderate to severe pain). 20 tablet 0 08/15/2022   Prenatal Vit-Fe Fumarate-FA (PRENATAL MULTIVITAMIN) TABS tablet Take 1 tablet by mouth daily at 12 noon. 90 tablet 3 08/15/2022    Review of Systems  Constitutional:  Positive for fatigue.  Gastrointestinal:  Positive for abdominal pain and vomiting.   Neurological:  Positive for dizziness and headaches.  All other systems reviewed and are negative.  Physical Exam   Blood pressure 121/77, pulse 99, temperature 98.4 F (36.9 C), temperature source Oral, resp. rate 16, SpO2 99 %, currently breastfeeding.  Physical Exam Vitals and nursing note reviewed. Exam conducted with a chaperone present.  Constitutional:      Appearance: She is well-developed. She is ill-appearing.  Cardiovascular:     Rate and Rhythm: Normal rate and regular rhythm.  Pulmonary:     Effort: Pulmonary effort is normal.     Breath sounds: Normal breath sounds.  Chest:     Comments: Breast exam declined by patient Abdominal:     General: Abdomen is flat. Bowel sounds are normal.     Palpations: Abdomen is soft.     Tenderness: There is no abdominal tenderness. There is no right CVA tenderness or left CVA tenderness.  Genitourinary:    Comments: Pelvic exam: External genitalia normal, vaginal walls pink and well rugated, cervix visually closed, no lesions noted.Scant blood-tinged discharge throughout vault.   Skin:    General: Skin is warm.     Capillary Refill: Capillary refill takes less than 2 seconds.  Neurological:     Mental Status: She is alert and oriented to person, place, and time.  Psychiatric:        Mood and Affect: Mood normal.        Behavior: Behavior normal.     MAU Course  Procedures: speculum exam  MDM  --Pertinent negatives: currently without leukocytosis, abdominal tenderness, nonintractable pain --Hgb 8.4 day of D&C. Now 9.4 on PO Fe. Continue as prescribed  Orders Placed This Encounter  Procedures   Rapid urine drug screen (hospital performed)   CBC   Comprehensive metabolic panel   Diet NPO time specified   ED EKG   Type and screen [facility]   Insert peripheral IV   Saline lock IV   Discharge patient   Patient Vitals for the past 24 hrs:  BP Temp Temp src Pulse Resp SpO2  08/16/22 1142 121/77  98.4 F (36.9 C) Oral 99 16 99 %  08/16/22 1138 -- -- -- -- -- 99 %   Results for orders placed or performed during the hospital encounter of 08/16/22 (from the past 24 hour(s))  Rapid urine drug screen (hospital performed)     Status: None   Collection Time: 08/16/22 12:04 PM  Result Value Ref Range   Opiates NONE DETECTED NONE DETECTED  Cocaine NONE DETECTED NONE DETECTED   Benzodiazepines NONE DETECTED NONE DETECTED   Amphetamines NONE DETECTED NONE DETECTED   Tetrahydrocannabinol NONE DETECTED NONE DETECTED   Barbiturates NONE DETECTED NONE DETECTED  CBC     Status: Abnormal   Collection Time: 08/16/22 12:04 PM  Result Value Ref Range   WBC 10.5 4.0 - 10.5 K/uL   RBC 3.10 (L) 3.87 - 5.11 MIL/uL   Hemoglobin 9.4 (L) 12.0 - 15.0 g/dL   HCT 16.1 (L) 09.6 - 04.5 %   MCV 96.8 80.0 - 100.0 fL   MCH 30.3 26.0 - 34.0 pg   MCHC 31.3 30.0 - 36.0 g/dL   RDW 40.9 81.1 - 91.4 %   Platelets 530 (H) 150 - 400 K/uL   nRBC 0.0 0.0 - 0.2 %  Comprehensive metabolic panel     Status: Abnormal   Collection Time: 08/16/22 12:04 PM  Result Value Ref Range   Sodium 140 135 - 145 mmol/L   Potassium 4.1 3.5 - 5.1 mmol/L   Chloride 105 98 - 111 mmol/L   CO2 24 22 - 32 mmol/L   Glucose, Bld 84 70 - 99 mg/dL   BUN 12 6 - 20 mg/dL   Creatinine, Ser 7.82 0.44 - 1.00 mg/dL   Calcium 9.6 8.9 - 95.6 mg/dL   Total Protein 6.7 6.5 - 8.1 g/dL   Albumin 3.0 (L) 3.5 - 5.0 g/dL   AST 20 15 - 41 U/L   ALT 13 0 - 44 U/L   Alkaline Phosphatase 105 38 - 126 U/L   Total Bilirubin <0.1 (L) 0.3 - 1.2 mg/dL   GFR, Estimated >21 >30 mL/min   Anion gap 11 5 - 15   Meds ordered this encounter  Medications   acetaminophen-caffeine (EXCEDRIN TENSION HEADACHE) 500-65 MG per tablet 2 tablet   Assessment and Plan  --22 y.o. [id number] s/p vaginal birth (08/05/22) and D&C (08/12/22) --Sleeping 1 hour after Excedrin Tension --Bleeding appropriate for postpartum stage --No acute complaints or findings on physical  exam --Advised increasing PO intake and periods of rest per standard postpartum guidance --Discharge home in stable condition with return precautions  Calvert Cantor, MSA, MSN, CNM 08/16/2022, 4:20 PM

## 2022-08-16 NOTE — Telephone Encounter (Signed)
Returned call, pt states that she has been having an ongoing headache 8/10 unrelieved by Ibuprofen, nausea and vomiting, and blurry vision and seeing black spots, all of which is getting worse. Advised pt to report to the hospital immediately, pt agreed.

## 2022-08-16 NOTE — Telephone Encounter (Signed)
Attempted hospital discharge follow-up phone call. Message received - voicemail full. Deforest Hoyles, RN, 08/16/22, [phone]

## 2022-08-17 ENCOUNTER — Encounter: Payer: Medicaid Other | Admitting: Student

## 2022-08-21 ENCOUNTER — Ambulatory Visit (INDEPENDENT_AMBULATORY_CARE_PROVIDER_SITE_OTHER): Payer: Medicaid Other | Admitting: Licensed Clinical Social Worker

## 2022-08-21 DIAGNOSIS — F53 Postpartum depression: Secondary | ICD-10-CM

## 2022-08-23 ENCOUNTER — Encounter: Payer: Self-pay | Admitting: Obstetrics and Gynecology

## 2022-08-23 ENCOUNTER — Ambulatory Visit (INDEPENDENT_AMBULATORY_CARE_PROVIDER_SITE_OTHER): Payer: Medicaid Other | Admitting: Obstetrics and Gynecology

## 2022-08-23 VITALS — BP 113/76 | HR 94 | Ht 65.0 in | Wt 204.0 lb

## 2022-08-23 DIAGNOSIS — Z30430 Encounter for insertion of intrauterine contraceptive device: Secondary | ICD-10-CM | POA: Diagnosis not present

## 2022-08-23 DIAGNOSIS — Z9889 Other specified postprocedural states: Secondary | ICD-10-CM

## 2022-08-23 MED ORDER — PARAGARD INTRAUTERINE COPPER IU IUD
1.0000 | INTRAUTERINE_SYSTEM | Freq: Once | INTRAUTERINE | Status: AC
Start: 2022-08-23 — End: 2022-08-23
  Administered 2022-08-23: 1 via INTRAUTERINE

## 2022-08-23 NOTE — Progress Notes (Addendum)
22 y.o presents for 2 weeks Post Op check.  Reports no concerns today.   Baby is in NICU.   Pt would like to get an IUD for [facility]  UPT today is Negative   Administrations This Visit     paragard intrauterine copper IUD 1 each     Admin Date 08/23/2022 Action Given Dose 1 each Route Intrauterine Administered By Maretta Bees, RMA

## 2022-08-23 NOTE — Addendum Note (Signed)
Addended by: Maretta Bees on: 08/23/2022 03:14 PM   Modules accepted: Orders

## 2022-08-23 NOTE — Progress Notes (Signed)
Alyssa Howell present for post op visit. Pt had SVD at 34 weeks due to PROM. Complicated by retained POC Underwent D & C on 08/12/22. Sent by in MAU on 08/16/22 for vaginal bleeding Pt has not complaints today. Bleeding has stopped. Denies any bowel or bladder dysfunction Desires ParaGard IUD  PE AF VSS Lungs clear Heart RRR Abd soft + BS     GYNECOLOGY CLINIC PROCEDURE NOTE   IUD Insertion Procedure Note Patient identified, informed consent performed, consent signed.   Discussed risks of irregular bleeding, cramping, infection, malpositioning or misplacement of the IUD outside the uterus which may require further procedure such as laparoscopy. Time out was performed.  Urine pregnancy test negative.  Speculum placed in the vagina.  Cervix visualized.  Cleaned with Betadine x 2.  Grasped anteriorly with a single tooth tenaculum.  Uterus sounded to 7 cm.  ParaGard IUD placed per manufacturer's recommendations.  Strings trimmed to 3 cm. Tenaculum was removed, good hemostasis noted.  Patient tolerated procedure well.   Patient was given post-procedure instructions.  She was advised to have backup contraception for one week.  Patient was also asked to check IUD strings periodically and follow up in 4 weeks for IUD check and PP visit  A/P Post op visit        IUD insertion  Doing well. F/U with routine PP visit and IUD string check

## 2022-08-25 ENCOUNTER — Ambulatory Visit (HOSPITAL_COMMUNITY): Payer: Self-pay

## 2022-08-25 NOTE — BH Specialist Note (Signed)
Integrated Behavioral Health via Telemedicine Visit  08/25/2022 Alyssa Howell [id number]  Number of Integrated Behavioral Health Clinician visits: 1 Session Start time:  1:00pm Session End time: 1:34pm Total time in minutes: 34 mins via mychart video   Referring Provider: Florene Route NP Patient/Family location: Home  [facility] Provider location: [place] All persons participating in visit: Alyssa Howell and LCSW A Stanely Sexson  Types of Service: Individual psychotherapy and Video visit  I connected with Alyssa Howell and/or Alyssa Howell's n/a via  Telephone or Engineer, civil (consulting)  (Video is Caregility application) and verified that I am speaking with the correct person using two identifiers. Discussed confidentiality: Yes   I discussed the limitations of telemedicine and the availability of in person appointments.  Discussed there is a possibility of technology failure and discussed alternative modes of communication if that failure occurs.  I discussed that engaging in this telemedicine visit, they consent to the provision of behavioral healthcare and the services will be billed under their insurance.  Patient and/or legal guardian expressed understanding and consented to Telemedicine visit: Yes   Presenting Concerns: Patient and/or family reports the following symptoms/concerns: postpartum depression  Duration of problem: approx 2 weeks  ; Severity of problem: mild  Patient and/or Family's Strengths/Protective Factors: Concrete supports in place (healthy food, safe environments, etc.)  Goals Addressed: Patient will:  Reduce symptoms of: depression and stress   Increase knowledge and/or ability of: coping skills and stress reduction   Demonstrate ability to: Increase healthy adjustment to current life circumstances and Increase adequate support systems for patient/family  Progress towards Goals: Ongoing  Interventions: Interventions utilized:  Supportive  Counseling Standardized Assessments completed: Alyssa Howell Postnatal Depression  Patient and/or Family Response: Alyssa Howell reports newborn son is currently in NICU. According to Alyssa Howell, she experience depressed mood, guilt, feeling overwhelmed, no support and stress. 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 Alyssa Howell reports feelings of guilt because her older son was also a NICU graduate.   Assessment: Patient currently experiencing postpartum depression.   Patient may benefit from integrated behavioral health.  Plan: Follow up with behavioral health clinician on : 09/20/2022 Referral(s): Integrated [employer] (In Clinic) 3. Discussed various coping skills such as journal writing, mindfulness, and self care  I discussed the assessment and treatment plan with the patient and/or parent/guardian. They were provided an opportunity to ask questions and all were answered. They agreed with the plan and demonstrated an understanding of the instructions.   They were advised to call back or seek an in-person evaluation if the symptoms worsen or if the condition fails to improve as anticipated.  Alyssa Saxon, LCSW

## 2022-08-25 NOTE — Lactation Note (Signed)
This note was copied from a baby's chart.  NICU Lactation Consultation Note  Patient Name: Alyssa Howell [id number] Date: 08/25/2022 Age:21 wk.o.  Reason for consult: Weekly NICU follow-up; NICU baby; Early term 29-38.6wks  SUBJECTIVE Visited with family of 73 1/36 weeks old AGA NICU female; Alyssa Howell is a P2 and reports her volumes have slightly increased since she had the D&C procedure for retained placenta, she's feeling much better now. She has also been taking baby to breast but he's still experiencing persistent tachypnea due to parainfluenza virus; feedings were stopped if HR was ANL. Discharge planning underway, asked her to call for latch assistance if needed. Reviewed pumping schedule, feeding cues and strategies to increase supply.   OBJECTIVE Infant data: Mother's Current Feeding Choice: Breast Milk and Formula  Infant feeding assessment Scale for Readiness: 2 Scale for Quality: 5 (feeding d/c due to tachypnea)   Maternal data: [id number]  Vaginal, Spontaneous Pumping frequency: 6-7 times/24 hours Pumped volume: 90 mL (90-150 ml) Flange Size: 21 Risk factor for low milk supply:: prematurity, infant separation, maternal blood loss of 658 cc. at birth and retained placenta on 08/11/2022  WIC Program: Yes WIC Referral Sent?: Yes What county?: [place] Pump: WIC Pump  ASSESSMENT Infant: Feeding Status: Scheduled 9-12-3-6  Maternal: Milk volume: Normal  INTERVENTIONS/PLAN Interventions: Interventions: Breast feeding basics reviewed; DEBP; Education Tools: Pump; Flanges Pump Education: Setup, frequency, and cleaning; Milk Storage  Plan: Encouraged to continue pumping every 2-3 hours, ideally 8 pumping sessions/24 hours; she'll try not going >6 hours without pumping at night She'll continue taking baby to breast on feeding cues around feeding time and will watch for stressors and signs of tachypnea She'll call for latch assistance prior discharge if/when needed    No other support person at this time. All questions and concerns answered, family to contact [facility] services PRN.  Consult Status: NICU follow-up NICU Follow-up type: Weekly NICU follow up; Assist with IDF-2 (Mother does not need to pre-pump before breastfeeding)   Edwina Grossberg Venetia Constable 08/25/2022, 12:50 PM

## 2022-08-29 ENCOUNTER — Ambulatory Visit (INDEPENDENT_AMBULATORY_CARE_PROVIDER_SITE_OTHER): Payer: Medicaid Other | Admitting: Student

## 2022-08-29 ENCOUNTER — Emergency Department (HOSPITAL_COMMUNITY): Payer: Medicaid Other

## 2022-08-29 ENCOUNTER — Encounter: Payer: Self-pay | Admitting: Student

## 2022-08-29 ENCOUNTER — Encounter (HOSPITAL_COMMUNITY): Payer: Self-pay

## 2022-08-29 ENCOUNTER — Emergency Department (HOSPITAL_COMMUNITY)
Admission: EM | Admit: 2022-08-29 | Discharge: 2022-08-29 | Disposition: A | Discharge status: Home / Self Care | Payer: Medicaid Other | Attending: Emergency Medicine | Admitting: Emergency Medicine

## 2022-08-29 VITALS — BP 102/62 | HR 74 | Ht 65.0 in | Wt 202.0 lb

## 2022-08-29 DIAGNOSIS — R519 Headache, unspecified: Secondary | ICD-10-CM | POA: Diagnosis not present

## 2022-08-29 DIAGNOSIS — Z0389 Encounter for observation for other suspected diseases and conditions ruled out: Secondary | ICD-10-CM | POA: Diagnosis not present

## 2022-08-29 DIAGNOSIS — O99355 Diseases of the nervous system complicating the puerperium: Secondary | ICD-10-CM | POA: Diagnosis not present

## 2022-08-29 DIAGNOSIS — G43809 Other migraine, not intractable, without status migrainosus: Secondary | ICD-10-CM | POA: Insufficient documentation

## 2022-08-29 DIAGNOSIS — O9081 Anemia of the puerperium: Secondary | ICD-10-CM | POA: Insufficient documentation

## 2022-08-29 DIAGNOSIS — R000 Tachycardia, unspecified: Secondary | ICD-10-CM | POA: Insufficient documentation

## 2022-08-29 DIAGNOSIS — O9089 Other complications of the puerperium, not elsewhere classified: Secondary | ICD-10-CM | POA: Diagnosis present

## 2022-08-29 LAB — CBC WITH DIFFERENTIAL/PLATELET
Abs Immature Granulocytes: 0.03 10*3/uL (ref 0.00–0.07)
Basophils Absolute: 0.1 10*3/uL (ref 0.0–0.1)
Basophils Relative: 1 %
Eosinophils Absolute: 0.7 10*3/uL — ABNORMAL HIGH (ref 0.0–0.5)
Eosinophils Relative: 6 %
HCT: 32.6 % — ABNORMAL LOW (ref 36.0–46.0)
Hemoglobin: 10 g/dL — ABNORMAL LOW (ref 12.0–15.0)
Immature Granulocytes: 0 %
Lymphocytes Relative: 33 %
Lymphs Abs: 3.4 10*3/uL (ref 0.7–4.0)
MCH: 28.8 pg (ref 26.0–34.0)
MCHC: 30.7 g/dL (ref 30.0–36.0)
MCV: 93.9 fL (ref 80.0–100.0)
Monocytes Absolute: 0.7 10*3/uL (ref 0.1–1.0)
Monocytes Relative: 7 %
Neutro Abs: 5.6 10*3/uL (ref 1.7–7.7)
Neutrophils Relative %: 53 %
Platelets: 484 10*3/uL — ABNORMAL HIGH (ref 150–400)
RBC: 3.47 MIL/uL — ABNORMAL LOW (ref 3.87–5.11)
RDW: 13.1 % (ref 11.5–15.5)
WBC: 10.5 10*3/uL (ref 4.0–10.5)
nRBC: 0 % (ref 0.0–0.2)

## 2022-08-29 LAB — BASIC METABOLIC PANEL
Anion gap: 12 (ref 5–15)
BUN: 16 mg/dL (ref 6–20)
CO2: 23 mmol/L (ref 22–32)
Calcium: 8.9 mg/dL (ref 8.9–10.3)
Chloride: 100 mmol/L (ref 98–111)
Creatinine, Ser: 0.89 mg/dL (ref 0.44–1.00)
GFR, Estimated: >60 mL/min (ref >60)
Glucose, Bld: 96 mg/dL (ref 70–99)
Potassium: 3.9 mmol/L (ref 3.5–5.1)
Sodium: 135 mmol/L (ref 135–145)

## 2022-08-29 LAB — I-STAT BETA HCG BLOOD, ED (MC, WL, AP ONLY): I-stat hCG, quantitative: <5 m[IU]/mL (ref <5)

## 2022-08-29 MED ORDER — DIPHENHYDRAMINE HCL 50 MG/ML IJ SOLN
25.0000 mg | Freq: Once | INTRAMUSCULAR | Status: AC
  Administered 2022-08-29: 25 mg via INTRAVENOUS
  Filled 2022-08-29: qty 1

## 2022-08-29 MED ORDER — METOCLOPRAMIDE HCL 5 MG/ML IJ SOLN
10.0000 mg | Freq: Once | INTRAMUSCULAR | Status: AC
  Administered 2022-08-29: 10 mg via INTRAVENOUS
  Filled 2022-08-29: qty 2

## 2022-08-29 MED ORDER — MAGNESIUM SULFATE 2 GM/50ML IV SOLN
2.0000 g | Freq: Once | INTRAVENOUS | Status: AC
  Administered 2022-08-29: 2 g via INTRAVENOUS
  Filled 2022-08-29: qty 50

## 2022-08-29 MED ORDER — GADOBUTROL 1 MMOL/ML IV SOLN
8.0000 mL | Freq: Once | INTRAVENOUS | Status: AC | PRN
  Administered 2022-08-29: 8 mL via INTRAVENOUS

## 2022-08-29 MED ORDER — IBUPROFEN 400 MG PO TABS
600.0000 mg | ORAL_TABLET | Freq: Once | ORAL | Status: AC
  Administered 2022-08-29: 600 mg via ORAL
  Filled 2022-08-29: qty 1

## 2022-08-29 MED ORDER — SODIUM CHLORIDE 0.9 % IV BOLUS
1000.0000 mL | Freq: Once | INTRAVENOUS | Status: AC
  Administered 2022-08-29: 1000 mL via INTRAVENOUS

## 2022-08-29 NOTE — Patient Instructions (Addendum)
I am sending you to the emergency room to be evaluated for your headache.  I am concerned with the duration and lack of resolution with Tylenol and ibuprofen that this could be something more serious since you are still within the postpartum period.  I am recommending an MRI with venography to visualize the blood vessels of your brain to rule out any blood clots.   I will have a note on epic (our electronic medical record) detailing our discussion and my concerns.  Once you have been evaluated by the emergency room physician, you can always come back reschedule an office visit so we can continue this discussion once we rule out a more serious problem.

## 2022-08-29 NOTE — Assessment & Plan Note (Addendum)
With this patient being 2 months postpartum and having constant symptoms without any relief with conservative treatment since delivery, I am most concerned about a central venous sinus thrombosis or other brain abnormality.  Clinically she is stable and her neuro exam is WNL.  Shared decision making with the patient on whether or not to schedule MRI outpatient versus going to the ED.  She opted to go to the emergency room to be evaluated with head imaging today.  I am recommending an MRI with venography to rule out CVT.  Instructed the patient that once she has been cleared with head imaging, she can return to the office for continued counseling for her headaches.

## 2022-08-29 NOTE — ED Provider Notes (Signed)
[place] EMERGENCY DEPARTMENT AT [facility] Provider Note   CSN: [id number] Arrival date & time: 08/29/22  1656     History  Chief Complaint  Patient presents with   Headache    Jimeka Ramdass is a 22 y.o. female with past medical history significant for acid reflux, depression, with recent pregnancy and delivery just 3 weeks ago presents with concern for worsening headache, blurred vision, seeing dots for the last 3 weeks.  Patient was sent by her PCP to rule out dural venous thrombosis, stroke, or other abnormality.  No history of eclampsia, preeclampsia.  No complications of birth history.  No previous history of migraines.   Headache      Home Medications Prior to Admission medications   Medication Sig Start Date End Date Taking? Authorizing Provider  ferrous gluconate (FERGON) 324 MG tablet Take 1 tablet (324 mg total) by mouth every other day. 08/14/22   Warden Fillers, MD  ibuprofen (ADVIL) 600 MG tablet Take 1 tablet (600 mg total) by mouth every 6 (six) hours as needed (mild pain). 08/13/22   Warden Fillers, MD  paragard intrauterine copper IUD IUD 1 each by Intrauterine route once.    [provider]  Prenatal Vit-Fe Fumarate-FA (PRENATAL MULTIVITAMIN) TABS tablet Take 1 tablet by mouth daily at 12 noon. 12/28/21   Sabino Dick, DO      Allergies    Abilify [aripiprazole]    Review of Systems   Review of Systems  Neurological:  Positive for headaches.  All other systems reviewed and are negative.   Physical Exam Updated Vital Signs BP 104/68 (BP Location: Right Arm)   Pulse 96   Temp 97.9 F (36.6 C) (Oral)   Resp 16   Ht 5\' 5"  (1.651 m)   Wt 91.6 kg   SpO2 98%   BMI 33.60 kg/m  Physical Exam Vitals and nursing note reviewed.  Constitutional:      General: She is not in acute distress.    Appearance: Normal appearance.  HENT:     Head: Normocephalic and atraumatic.  Eyes:     General:        Right eye: No discharge.         Left eye: No discharge.  Cardiovascular:     Rate and Rhythm: Regular rhythm. Tachycardia present.     Heart sounds: No murmur heard.    No friction rub. No gallop.  Pulmonary:     Effort: Pulmonary effort is normal.     Breath sounds: Normal breath sounds.  Abdominal:     General: Bowel sounds are normal.     Palpations: Abdomen is soft.  Skin:    General: Skin is warm and dry.     Capillary Refill: Capillary refill takes less than 2 seconds.  Neurological:     Mental Status: She is alert and oriented to person, place, and time.     Comments: Cranial nerves II through XII grossly intact.  Intact finger-nose, intact heel-to-shin.  Romberg negative, gait normal.  Alert and oriented x3.  Moves all 4 limbs spontaneously, normal coordination.  No pronator drift.  Intact strength 5 out of 5 bilateral upper and lower extremities.    Psychiatric:        Mood and Affect: Mood normal.        Behavior: Behavior normal.     ED Results / Procedures / Treatments   Labs (all labs ordered are listed, but only abnormal results are displayed)  Labs Reviewed  CBC WITH DIFFERENTIAL/PLATELET - Abnormal; Notable for the following components:      Result Value   RBC 3.47 (*)    Hemoglobin 10.0 (*)    HCT 32.6 (*)    Platelets 484 (*)    Eosinophils Absolute 0.7 (*)    All other components within normal limits  BASIC METABOLIC PANEL  I-STAT BETA HCG BLOOD, ED ([facility], [facility], [facility] ONLY)    EKG None  Radiology MR MRV HEAD W WO CONTRAST  Result Date: 08/29/2022 CLINICAL DATA:  Dural venous sinus thrombosis suspected EXAM: MR VENOGRAM HEAD WITHOUT AND WITH CONTRAST TECHNIQUE: Angiographic images of the intracranial venous structures were acquired using MRV technique without and with intravenous contrast. CONTRAST:  8mL GADAVIST GADOBUTROL 1 MMOL/ML IV SOLN COMPARISON:  None Available. FINDINGS: Superior sagittal sinus: Normal. Straight sinus: Normal. Inferior sagittal sinus, vein of Galen and internal  cerebral veins: Normal. Transverse sinuses: Normal. Sigmoid sinuses: Normal. Visualized jugular veins: Normal. IMPRESSION: Normal intracranial MRV. Electronically Signed   By: Deatra Robinson M.D.   On: 08/29/2022 19:11    Procedures Procedures    Medications Ordered in ED Medications  sodium chloride 0.9 % bolus 1,000 mL (0 mLs Intravenous Stopped 08/29/22 2011)  metoCLOPramide (REGLAN) injection 10 mg (10 mg Intravenous Given 08/29/22 1903)  diphenhydrAMINE (BENADRYL) injection 25 mg (25 mg Intravenous Given 08/29/22 1901)  magnesium sulfate IVPB 2 g 50 mL (0 g Intravenous Stopped 08/29/22 2011)  ibuprofen (ADVIL) tablet 600 mg (600 mg Oral Given 08/29/22 1857)  gadobutrol (GADAVIST) 1 MMOL/ML injection 8 mL (8 mLs Intravenous Contrast Given 08/29/22 1832)    ED Course/ Medical Decision Making/ A&P                             Medical Decision Making Amount and/or Complexity of Data Reviewed Labs: ordered. Radiology: ordered.  Risk Prescription drug management.   This patient is a 22 y.o. female  who presents to the ED for concern of headache, visual changes.   Differential diagnoses prior to evaluation: The emergent differential diagnosis includes, but is not limited to,  Stroke, increased ICP, meningitis, CVA, intracranial tumor, venous sinus thrombosis, migraine, cluster headache, hypertension, drug related, head injury, tension headache, sinusitis, dental abscess, otitis media, TMJ, additionally as patient is only 1 month postdelivery considered eclampsia, CSF leak headache. This is not an exhaustive differential.   Past Medical History / Co-morbidities: Notably with recent pregnancy, delivery around 3 weeks ago, epidural administered at that time  Additional history: Chart reviewed. Pertinent results include: History of depression, remote history of seizures, no history of preeclampsia, eclampsia, no complications to birth  Physical Exam: Physical exam performed. The pertinent  findings include: On my exam patient with no focal neurologic deficits.  Mildly elevated diastolic blood pressure on arrival, diastolic at 98, mild tachycardia as well.  Her blood pressure is improved, normotensive on reevaluation.  Lab Tests/Imaging studies: I personally interpreted labs/imaging and the pertinent results include: CBC notable for mild anemia, hemoglobin 10.  BMP unremarkable..  Independently interpreted MRI MRV head without contrast which shows no evidence of acute intracranial abnormality.  I agree with the radiologist interpretation.   Medications: I ordered medication including magnesium, fluid bolus, Reglan, Benadryl, ibuprofen for migraine-like symptoms.  I have reviewed the patients home medicines and have made adjustments as needed.   Disposition: After consideration of the diagnostic results and the patients response to treatment, I feel that on  reevaluation patient reports his symptoms improved, had considered a possibility of ongoing CSF leak, but given length of time since her epidural I think this is less likely, overall symptoms seem consistent with migraine, especially with resolution after migraine cocktail.Marland Kitchen   emergency department workup does not suggest an emergent condition requiring admission or immediate intervention beyond what has been performed at this time. The plan is: as above. The patient is safe for discharge and has been instructed to return immediately for worsening symptoms, change in symptoms or any other concerns.  Final Clinical Impression(s) / ED Diagnoses Final diagnoses:  Other migraine without status migrainosus, not intractable    Rx / DC Orders ED Discharge Orders     None         Olene Floss, PA-C 08/30/22 1221    Elayne Snare K, DO 09/01/22 [phone]

## 2022-08-29 NOTE — Progress Notes (Signed)
    SUBJECTIVE:   CHIEF COMPLAINT / HPI:   Alyssa Howell is a 22 y.o. female  presenting for a constant headache since delivering on 4/12.  Patient reports since delivery she has had a constant headache with occasional sharp pains every day.  She has tried Tylenol 600 mg and Tylenol which has not caused any relief.    She was evaluated at the [facility] and received Excedrin which did seem to help but quickly wore off when she returned home.  She has occasional blurry vision and black dots with nausea and some vomiting.  She denies recent falls or ataxia.  No seizure activity reported.    PERTINENT  PMH / PSH: Reviewed   OBJECTIVE:   BP 102/62   Pulse 74   Ht 5\' 5"  (1.651 m)   Wt 202 lb (91.6 kg)   SpO2 98%   BMI 33.61 kg/m   Uncomfortable-appearing, no acute distress Cardio: Regular rate, regular rhythm, no murmurs on exam. Pulm: Clear, no wheezing, no crackles. No increased work of breathing Abdominal: bowel sounds present, soft, non-tender, non-distended Extremities: no peripheral edema    Neuro: CN II: PERRL CN III, IV,VI: EOMI CV V: Normal sensation in V1, V2, V3 CVII: Symmetric smile and brow raise CN VIII: Normal hearing CN IX,X: Symmetric palate raise  CN XI: 5/5 shoulder shrug CN XII: Symmetric tongue protrusion  UE and LE strength 5/5 2+ UE and LE reflexes  Normal sensation in UE and LE bilaterally  No ataxia with finger to nose, normal heel to shin  Negative Rhomberg   ASSESSMENT/PLAN:   Headache With this patient being 2 months postpartum and having constant symptoms without any relief with conservative treatment since delivery, I am most concerned about a central venous sinus thrombosis or other brain abnormality.  Clinically she is stable and her neuro exam is WNL.  Shared decision making with the patient on whether or not to schedule MRI outpatient versus going to the ED.  She opted to go to the emergency room to be evaluated with head imaging today.  I am  recommending an MRI with venography to rule out CVT.  Instructed the patient that once she has been cleared with head imaging, she can return to the office for continued counseling for her headaches.     Glendale Chard, DO [facility]

## 2022-08-29 NOTE — ED Triage Notes (Signed)
Pt was sent by PCP to get an MRI to r/o stroke due to being three weeks post partum and has had a HA since and blurred vision and pt states she sees "dots."

## 2022-09-02 ENCOUNTER — Emergency Department (HOSPITAL_COMMUNITY)
Admission: EM | Admit: 2022-09-02 | Discharge: 2022-09-02 | Disposition: A | Discharge status: Home / Self Care | Payer: Medicaid Other | Attending: Emergency Medicine | Admitting: Emergency Medicine

## 2022-09-02 ENCOUNTER — Other Ambulatory Visit: Payer: Self-pay

## 2022-09-02 ENCOUNTER — Emergency Department (HOSPITAL_COMMUNITY): Payer: Medicaid Other

## 2022-09-02 ENCOUNTER — Encounter (HOSPITAL_COMMUNITY): Payer: Self-pay

## 2022-09-02 DIAGNOSIS — R519 Headache, unspecified: Secondary | ICD-10-CM | POA: Diagnosis not present

## 2022-09-02 DIAGNOSIS — S0012XA Contusion of left eyelid and periocular area, initial encounter: Secondary | ICD-10-CM | POA: Insufficient documentation

## 2022-09-02 DIAGNOSIS — T7491XA Unspecified adult maltreatment, confirmed, initial encounter: Secondary | ICD-10-CM | POA: Diagnosis not present

## 2022-09-02 DIAGNOSIS — H5712 Ocular pain, left eye: Secondary | ICD-10-CM | POA: Diagnosis present

## 2022-09-02 DIAGNOSIS — S0993XA Unspecified injury of face, initial encounter: Secondary | ICD-10-CM | POA: Diagnosis not present

## 2022-09-02 DIAGNOSIS — S0990XA Unspecified injury of head, initial encounter: Secondary | ICD-10-CM | POA: Diagnosis not present

## 2022-09-02 LAB — PREGNANCY, URINE: Preg Test, Ur: NEGATIVE

## 2022-09-02 MED ORDER — KETOROLAC TROMETHAMINE 15 MG/ML IJ SOLN
15.0000 mg | Freq: Once | INTRAMUSCULAR | Status: AC
  Administered 2022-09-02: 15 mg via INTRAVENOUS
  Filled 2022-09-02: qty 1

## 2022-09-02 MED ORDER — PROCHLORPERAZINE EDISYLATE 10 MG/2ML IJ SOLN
10.0000 mg | INTRAMUSCULAR | Status: AC
  Administered 2022-09-02: 10 mg via INTRAVENOUS
  Filled 2022-09-02: qty 2

## 2022-09-02 MED ORDER — DEXAMETHASONE SODIUM PHOSPHATE 10 MG/ML IJ SOLN
10.0000 mg | Freq: Once | INTRAMUSCULAR | Status: AC
  Administered 2022-09-02: 10 mg via INTRAVENOUS
  Filled 2022-09-02: qty 1

## 2022-09-02 MED ORDER — TETRACAINE HCL 0.5 % OP SOLN
2.0000 [drp] | Freq: Once | OPHTHALMIC | Status: AC
  Administered 2022-09-02: 2 [drp] via OPHTHALMIC
  Filled 2022-09-02: qty 4

## 2022-09-02 MED ORDER — FLUORESCEIN SODIUM 1 MG OP STRP
1.0000 | ORAL_STRIP | Freq: Once | OPHTHALMIC | Status: AC
  Administered 2022-09-02: 1 via OPHTHALMIC
  Filled 2022-09-02: qty 1
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
## 2022-09-05 ENCOUNTER — Encounter: Payer: Self-pay | Admitting: Student

## 2022-09-05 ENCOUNTER — Ambulatory Visit (INDEPENDENT_AMBULATORY_CARE_PROVIDER_SITE_OTHER): Payer: Medicaid Other | Admitting: Student

## 2022-09-05 ENCOUNTER — Other Ambulatory Visit: Payer: Self-pay

## 2022-09-05 VITALS — BP 120/67 | HR 96 | Ht 65.0 in | Wt 205.6 lb

## 2022-09-05 DIAGNOSIS — R519 Headache, unspecified: Secondary | ICD-10-CM

## 2022-09-05 DIAGNOSIS — F419 Anxiety disorder, unspecified: Secondary | ICD-10-CM

## 2022-09-05 DIAGNOSIS — F32A Depression, unspecified: Secondary | ICD-10-CM

## 2022-09-05 DIAGNOSIS — F339 Major depressive disorder, recurrent, unspecified: Secondary | ICD-10-CM

## 2022-09-05 MED ORDER — SUMATRIPTAN SUCCINATE 50 MG PO TABS
50.0000 mg | ORAL_TABLET | ORAL | 0 refills | Status: AC | PRN
Start: 2022-09-05 — End: ?

## 2022-09-05 MED ORDER — SERTRALINE HCL 25 MG PO TABS
25.0000 mg | ORAL_TABLET | Freq: Every day | ORAL | 0 refills | Status: DC
Start: 2022-09-05 — End: 2022-11-17

## 2022-09-05 NOTE — Assessment & Plan Note (Signed)
Reassuring neuroimaging and response to migraine cocktails and patient is no longer breast-feeding.  Will prescribe Imitrex 50 mg to relieve migraines.  Neurology referral sent in ED.  Patient to follow-up with them outpatient

## 2022-09-05 NOTE — Patient Instructions (Addendum)
It was great to see you today!   Today we addressed: Anxiety/Depression: I am starting a medication called Zoloft. You can take this in the morning or at night depending on how it affects you.  Headaches: I am prescribing a medication called Imitrex. You can take 50-100 mg at the start of a headache. You can also try Magnesium over the counter for 3 days to see if that can also help control your migraines.   Future Appointments  Date Time Provider Department Center  09/14/2022 10:15 AM Glendale Chard, DO [facility]  09/20/2022  1:50 PM Leftwich-Kirby, Wilmer Floor, CNM [facility] None  09/22/2022 10:15 AM Hermina Staggers, MD [facility] None    Please arrive 15 minutes before your appointment to ensure smooth check in process.    Please call the clinic at [phone] if your symptoms worsen or you have any concerns.  Thank you for allowing me to participate in your care, Dr. Glendale Chard [facility]

## 2022-09-05 NOTE — Assessment & Plan Note (Signed)
Patient is interested in trying medication to treat her anxiety and depression.  Will start her on Zoloft 25 mg daily and follow-up within the next 1 to 2 weeks for medication tolerance and titration.  She is plugged in with a therapist and is scheduled to see them today.
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
## 2022-09-14 ENCOUNTER — Ambulatory Visit: Payer: Self-pay | Admitting: Student

## 2022-09-20 ENCOUNTER — Ambulatory Visit: Payer: Medicaid Other | Admitting: Advanced Practice Midwife

## 2022-09-20 NOTE — Progress Notes (Deleted)
   GYNECOLOGY CLINIC PROGRESS NOTE  History:  22 y.o. [id number] here at [facility] *** today for today for IUD string check; Paragard IUD was placed  08/23/22. No complaints about the IUD, no concerning side effects.  The following portions of the patient's history were reviewed and updated as appropriate: allergies, current medications, past family history, past medical history, past social history, past surgical history and problem list.    *********** Last pap smear on *** was normal, *** HRHPV.  Review of Systems:  Pertinent items are noted in HPI.   Objective:  Physical Exam currently breastfeeding. Gen: NAD Abd: Soft, nontender and nondistended Pelvic: Normal appearing external genitalia; normal appearing vaginal mucosa and cervix.  IUD strings visualized, about *** cm in length outside cervix.   Assessment & Plan:  Normal IUD check. Patient to keep IUD in place for 10 years; can come in for removal if she desires pregnancy within the next 10 years. Routine preventative health maintenance measures emphasized.  No follow-ups on file.   Sharen Counter, CNM 9:01 AM

## 2022-09-22 ENCOUNTER — Ambulatory Visit: Payer: Medicaid Other | Admitting: Obstetrics and Gynecology

## 2022-11-17 ENCOUNTER — Ambulatory Visit (HOSPITAL_COMMUNITY)
Admission: EM | Admit: 2022-11-17 | Discharge: 2022-11-17 | Disposition: A | Discharge status: Home / Self Care | Payer: Medicaid Other | Attending: Emergency Medicine | Admitting: Emergency Medicine

## 2022-11-17 ENCOUNTER — Encounter (HOSPITAL_COMMUNITY): Payer: Self-pay

## 2022-11-17 DIAGNOSIS — M62838 Other muscle spasm: Secondary | ICD-10-CM | POA: Diagnosis not present

## 2022-11-17 DIAGNOSIS — M25512 Pain in left shoulder: Secondary | ICD-10-CM | POA: Diagnosis not present

## 2022-11-17 MED ORDER — PREDNISONE 10 MG PO TABS: 40.0000 mg | ORAL_TABLET | Freq: Every day | ORAL | 0 refills | Status: AC

## 2022-11-17 MED ORDER — METHOCARBAMOL 500 MG PO TABS: 500.0000 mg | ORAL_TABLET | Freq: Two times a day (BID) | ORAL | 0 refills | Status: DC

## 2022-11-17 NOTE — ED Triage Notes (Signed)
Patient here today with c/o left side neck pain that radiates down to left shoulder and shoulder blade X 2 days. 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 Pain seems to be worsening. It hurts to turn her head and lift her left arm. Has tried massage and some cream but did not help.

## 2022-11-17 NOTE — Discharge Instructions (Addendum)
I believe you are having muscle spasms due to your repetitive movements at work and repetitive lifting.  Please rest, heat the area and use gentle stretching.  Massaging can help as well.  You can take the muscle relaxer twice daily, do not drink or drive on this medication. Take the steroids daily with breakfast.  For continued pain you can take 600 mg of ibuprofen every 8 hours with food. If your pain persist beyond the next week or so despite our interventions, please follow-up with an orthopedic for further evaluation.  Due to your insurance he may have to see your primary care provider before to obtain this referral.  Please return to clinic for any new or urgent symptoms.
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
## 2022-12-14 ENCOUNTER — Other Ambulatory Visit: Payer: Self-pay

## 2022-12-14 ENCOUNTER — Emergency Department (HOSPITAL_COMMUNITY)
Admission: EM | Admit: 2022-12-14 | Discharge: 2022-12-14 | Disposition: A | Discharge status: Home / Self Care | Payer: Medicaid Other | Attending: Emergency Medicine | Admitting: Emergency Medicine

## 2022-12-14 ENCOUNTER — Emergency Department (HOSPITAL_COMMUNITY): Payer: Medicaid Other

## 2022-12-14 ENCOUNTER — Encounter (HOSPITAL_COMMUNITY): Payer: Self-pay | Admitting: Emergency Medicine

## 2022-12-14 DIAGNOSIS — M79671 Pain in right foot: Secondary | ICD-10-CM | POA: Insufficient documentation

## 2022-12-14 DIAGNOSIS — Z4789 Encounter for other orthopedic aftercare: Secondary | ICD-10-CM | POA: Diagnosis not present

## 2022-12-14 NOTE — ED Provider Notes (Signed)
[place] EMERGENCY DEPARTMENT AT [facility] Provider Note   CSN: [id number] Arrival date & time: 12/14/22  1734     History  Chief Complaint  Patient presents with   Foot Pain    Alyssa Howell is a 22 y.o. female with a Paschal history significant for foot surgery in 2020, anemia, kidney stone, seizure presents today for evaluation of foot pain.  Patient reports that she has had pain in her right foot after stepping out of her [place] truck and felt a pop in her right foot a week ago.  She has a metal pin in the foot in previous surgery.  Patient has been ambulatory using the inner sides of her right foot.  She denies any loss of sensation to her toes.   Foot Pain    Past Medical History:  Diagnosis Date   ADHD    Anemia    Chlamydia 2022   Depression    Kidney stones    Seizures (HCC)    x 1 in 5th grade after taking ability. No other episodes.   Vesico-ureteral reflux    Past Surgical History:  Procedure Laterality Date   CYSTOSCOPY/URETEROSCOPY/HOLMIUM LASER/STENT PLACEMENT Right 08/02/2020   Procedure: CYSTOSCOPY/URETEROSCOPY/HOLMIUM LASER/STENT PLACEMENT;  Surgeon: Vanna Scotland, MD;  Location: [facility] ORS;  Service: Urology;  Laterality: Right;   DILATION AND CURETTAGE OF UTERUS N/A 08/12/2022   Procedure: DILATATION AND CURETTAGE, ULTRASOUND GUIDED;  Surgeon: Warden Fillers, MD;  Location: [facility] OR;  Service: Gynecology;  Laterality: N/A;   FOOT SURGERY Bilateral    metal screws in toes,2020   TONSILLECTOMY       Home Medications Prior to Admission medications   Medication Sig Start Date End Date Taking? Authorizing Provider  methocarbamol (ROBAXIN) 500 MG tablet Take 1 tablet (500 mg total) by mouth 2 (two) times daily. 11/17/22   Garrison, [place] N, FNP  paragard intrauterine copper IUD IUD 1 each by Intrauterine route once.    [provider]  SUMAtriptan (IMITREX) 50 MG tablet Take 1 tablet (50 mg total) by mouth every 2 (two) hours as  needed for migraine. May repeat in 2 hours if headache persists or recurs. Not exceeding 200 mg in 24 hours. 09/05/22   Glendale Chard, DO      Allergies    Abilify [aripiprazole]    Review of Systems   Review of Systems Negative except as per HPI.  Physical Exam Updated Vital Signs BP 125/66   Pulse 64   Temp 98.6 F (37 C) (Oral)   Resp 17   LMP 11/17/2022 (Exact Date)   SpO2 100%  Physical Exam Vitals and nursing note reviewed.  Constitutional:      Appearance: Normal appearance.  HENT:     Head: Normocephalic and atraumatic.     Mouth/Throat:     Mouth: Mucous membranes are moist.  Eyes:     General: No scleral icterus. Cardiovascular:     Rate and Rhythm: Normal rate and regular rhythm.     Pulses: Normal pulses.     Heart sounds: Normal heart sounds.  Pulmonary:     Effort: Pulmonary effort is normal.     Breath sounds: Normal breath sounds.  Abdominal:     General: Abdomen is flat.     Palpations: Abdomen is soft.     Tenderness: There is no abdominal tenderness.  Musculoskeletal:        General: No deformity.     Comments: Tenderness to palpation to the dorsal  aspect of the right foot near the fifth toes.  Well healing wound from previous foot surgery.  Skin:    General: Skin is warm.     Findings: No rash.  Neurological:     General: No focal deficit present.     Mental Status: She is alert.  Psychiatric:        Mood and Affect: Mood normal.     ED Results / Procedures / Treatments   Labs (all labs ordered are listed, but only abnormal results are displayed) Labs Reviewed - No data to display  EKG None  Radiology DG Foot Complete Right  Result Date: 12/14/2022 CLINICAL DATA:  Twisting injury and pain.  Prior surgery EXAM: RIGHT FOOT COMPLETE - 3+ VIEW COMPARISON:  12/19/2019 FINDINGS: Prior screw fixation of the distal fifth metatarsal. Hardware is intact, no acute fracture identified. IMPRESSION: No acute osseous abnormality. Electronically  Signed   By: Jeronimo Greaves M.D.   On: 12/14/2022 19:13    Procedures Procedures    Medications Ordered in ED Medications - No data to display  ED Course/ Medical Decision Making/ A&P                                 Medical Decision Making Amount and/or Complexity of Data Reviewed Radiology: ordered.   This patient presents to the ED for foot pain, this involves an extensive number of treatment options, and is a complaint that carries with a high risk of complications and morbidity.  The differential diagnosis includes fracture, dislocation, ligamentous injury.  This is not an exhaustive list.  Imaging studies: I ordered imaging studies, personally reviewed, interpreted imaging and agree with the radiologist's interpretations. The results include: Negative foot x-ray.  Problem list/ ED course/ Critical interventions/ Medical management: HPI: See above Vital signs within normal range and stable throughout visit. Laboratory/imaging studies significant for: See above. On physical examination, patient is afebrile and appears in no acute distress.  Tenderness to palpation to the dorsal aspect of the right foot near the fifth toes.  Well-healing wound from previous surgery.  No overlying skin erythema, doubt cellulitis.  No loss of sensation to the right toes.  He is able to bear weight on the right foot.  I have low suspicion for fracture, dislocation, significant ligamentous injury, septic arthritis, gout flare, new autoimmune arthropathy, or gonococcal arthropathy. I have reviewed the patient home medicines and have made adjustments as needed.  Cardiac monitoring/EKG: The patient was maintained on a cardiac monitor.  I personally reviewed and interpreted the cardiac monitor which showed an underlying rhythm of: sinus rhythm.  Additional history obtained: External records from outside source obtained and reviewed including: Chart review including previous notes, labs,  imaging.  Consultations obtained:  Disposition Continued outpatient therapy. Follow-up with podiatry or orthopedics recommended for reevaluation of symptoms. Treatment plan discussed with patient.  Pt acknowledged understanding was agreeable to the plan. Worrisome signs and symptoms were discussed with patient, and patient acknowledged understanding to return to the ED if they noticed these signs and symptoms. Patient was stable upon discharge.   This chart was dictated using voice recognition software.  Despite best efforts to proofread,  errors can occur which can change the documentation meaning.          Final Clinical Impression(s) / ED Diagnoses Final diagnoses:  Right foot pain    Rx / DC Orders ED Discharge Orders  None         Jeanelle Malling, [place] 12/14/22 2147    Tegeler, Canary Brim, MD 12/14/22 [phone]

## 2022-12-14 NOTE — ED Notes (Signed)
Patient verbalizes understanding of discharge instructions. Opportunity for questioning and answers were provided. Armband removed by staff, pt discharged from ED. Pt taken to ED entrance via wheel chair.  

## 2022-12-14 NOTE — Discharge Instructions (Addendum)
Your x-ray today did not show any evidence of fracture or dislocation.  Please take tylenol/ibuprofen for pain.  You can also use ice compression for symptom relief.  I recommend close follow-up with podiatry or orthopedics for reevaluation.  Please do not hesitate to return to emergency department if worrisome signs symptoms we discussed become apparent.

## 2022-12-14 NOTE — ED Triage Notes (Signed)
Pt with right foot pain after stepping out of her [employer] truck and feeling a "pop"  hx of metal pins in that foot in previous surgery.

## 2023-01-03 ENCOUNTER — Ambulatory Visit: Payer: Medicaid Other | Admitting: Podiatry

## 2023-01-22 ENCOUNTER — Other Ambulatory Visit (HOSPITAL_COMMUNITY)
Admission: RE | Admit: 2023-01-22 | Discharge: 2023-01-22 | Disposition: A | Discharge status: Home / Self Care | Payer: Medicaid Other | Source: Ambulatory Visit | Attending: Obstetrics and Gynecology | Admitting: Obstetrics and Gynecology

## 2023-01-22 ENCOUNTER — Ambulatory Visit (INDEPENDENT_AMBULATORY_CARE_PROVIDER_SITE_OTHER): Payer: Medicaid Other | Admitting: Obstetrics and Gynecology

## 2023-01-22 ENCOUNTER — Encounter: Payer: Self-pay | Admitting: Obstetrics and Gynecology

## 2023-01-22 VITALS — BP 112/75 | HR 91 | Wt 208.0 lb

## 2023-01-22 DIAGNOSIS — Z202 Contact with and (suspected) exposure to infections with a predominantly sexual mode of transmission: Secondary | ICD-10-CM | POA: Diagnosis not present

## 2023-01-22 DIAGNOSIS — Z30432 Encounter for removal of intrauterine contraceptive device: Secondary | ICD-10-CM | POA: Diagnosis not present

## 2023-01-22 NOTE — Progress Notes (Signed)
Pt states that she has severe pain with intercourse, and increase in vaginal bleeding- soaking super overnight products.   Pt would also like all std screening today.

## 2023-01-22 NOTE — Progress Notes (Signed)
    GYNECOLOGY CLINIC PROCEDURE NOTE  Alyssa Howell is a 22 y.o. [id number] here for IUD removal and STD testing.  IUD Removal  Patient identified, informed consent performed, consent signed.  Patient was in the dorsal lithotomy position, normal external genitalia was noted.  A speculum was placed in the patient's vagina, normal discharge was noted, no lesions. The cervix was visualized, no lesions, no abnormal discharge.  The strings of the IUD were grasped and pulled using ring forceps. The IUD was removed in its entirety.  ( Patient tolerated the procedure well.    Pt desires STD testing as well Pt plans absence for contraception at this time  Nettie Elm, MD, FACOG Attending Obstetrician & Gynecologist Center for [facility], [facility]

## 2023-01-23 ENCOUNTER — Other Ambulatory Visit: Payer: Self-pay

## 2023-01-23 DIAGNOSIS — B9689 Other specified bacterial agents as the cause of diseases classified elsewhere: Secondary | ICD-10-CM

## 2023-01-23 LAB — CERVICOVAGINAL ANCILLARY ONLY
Bacterial Vaginitis (gardnerella): POSITIVE — AB
Candida Glabrata: NEGATIVE
Candida Vaginitis: NEGATIVE
Chlamydia: NEGATIVE
Comment: NEGATIVE
Comment: NEGATIVE
Comment: NEGATIVE
Comment: NEGATIVE
Comment: NEGATIVE
Comment: NORMAL
Neisseria Gonorrhea: NEGATIVE
Trichomonas: NEGATIVE

## 2023-01-23 LAB — HEPATITIS B SURFACE ANTIGEN: Hepatitis B Surface Ag: NEGATIVE

## 2023-01-23 LAB — HIV ANTIBODY (ROUTINE TESTING W REFLEX): HIV Screen 4th Generation wRfx: NONREACTIVE

## 2023-01-23 LAB — RPR: RPR Ser Ql: NONREACTIVE

## 2023-01-23 LAB — HEPATITIS C ANTIBODY: Hep C Virus Ab: NONREACTIVE

## 2023-01-23 MED ORDER — METRONIDAZOLE 500 MG PO TABS
500.0000 mg | ORAL_TABLET | Freq: Two times a day (BID) | ORAL | 0 refills | Status: DC
Start: 2023-01-23 — End: 2023-09-01

## 2023-03-01 DIAGNOSIS — N760 Acute vaginitis: Secondary | ICD-10-CM | POA: Diagnosis not present

## 2023-03-01 DIAGNOSIS — N390 Urinary tract infection, site not specified: Secondary | ICD-10-CM | POA: Diagnosis not present

## 2023-03-01 DIAGNOSIS — E876 Hypokalemia: Secondary | ICD-10-CM | POA: Diagnosis not present

## 2023-03-01 DIAGNOSIS — R109 Unspecified abdominal pain: Secondary | ICD-10-CM | POA: Diagnosis not present

## 2023-03-07 ENCOUNTER — Encounter: Payer: Self-pay | Admitting: Family Medicine

## 2023-03-08 DIAGNOSIS — B009 Herpesviral infection, unspecified: Secondary | ICD-10-CM | POA: Diagnosis not present

## 2023-03-08 NOTE — Telephone Encounter (Signed)
Noted--patient seen in ER.  Terisa Starr, MD  Family Medicine Teaching Service

## 2023-04-19 DIAGNOSIS — Z5321 Procedure and treatment not carried out due to patient leaving prior to being seen by health care provider: Secondary | ICD-10-CM | POA: Diagnosis not present

## 2023-04-19 DIAGNOSIS — L989 Disorder of the skin and subcutaneous tissue, unspecified: Secondary | ICD-10-CM | POA: Diagnosis not present

## 2023-04-25 NOTE — L&D Delivery Note (Signed)
 Labor Progress Catalea Labrecque is a 23 y.o. female [phone] with IUP at [redacted]w[redacted]d admitted for IOL for maternal renal calculi, poor pain management, plan for treatment postpartum.  She progressed with augmentation to complete and pushed ~ 30 minutes to deliver.  Cord clamping delayed by 1-3 minutes then clamped by CNM and cut by FOB and pt together.  Placenta intact and spontaneous, bleeding minimal.  No laceration or repair.   Infant to NICU. Infant initially cried and was stimulated on patient's abdomen but became dusky and was taken to warmer shortly after cord cutting.  NICU NP to room to evaluate infant who is on CPAP administered by RN.  Infant to NICU once stable.  Pt stable at time of transfer to MB Unit. She plans on breast and formula feeding. She requests OP IUD for birth control.  Delivery Note At 8:25 AM a viable and healthy female was delivered via Vaginal, Spontaneous (Presentation: Middle Occiput Posterior).  APGAR: 7, 7; weight 5 lb 9.6 oz (2540 g).   Placenta status: Expressed, Intact.  Cord: 3 vessels with the following complications: None.    Anesthesia: Epidural Episiotomy: None Lacerations: None Suture Repair: n/a Est. Blood Loss (mL): 484  Mom to postpartum.  Baby to NICU.  Olam Boards 04/02/2024, 9:33 AM

## 2023-06-05 DIAGNOSIS — N760 Acute vaginitis: Secondary | ICD-10-CM | POA: Diagnosis not present

## 2023-06-05 DIAGNOSIS — B9689 Other specified bacterial agents as the cause of diseases classified elsewhere: Secondary | ICD-10-CM | POA: Diagnosis not present

## 2023-09-01 ENCOUNTER — Inpatient Hospital Stay (HOSPITAL_COMMUNITY)

## 2023-09-01 ENCOUNTER — Inpatient Hospital Stay (HOSPITAL_COMMUNITY)
Admission: AD | Admit: 2023-09-01 | Discharge: 2023-09-01 | Disposition: A | Discharge status: Home / Self Care | Attending: Obstetrics and Gynecology | Admitting: Obstetrics and Gynecology

## 2023-09-01 ENCOUNTER — Encounter (HOSPITAL_COMMUNITY): Payer: Self-pay | Admitting: Obstetrics and Gynecology

## 2023-09-01 DIAGNOSIS — O9A211 Injury, poisoning and certain other consequences of external causes complicating pregnancy, first trimester: Secondary | ICD-10-CM | POA: Diagnosis not present

## 2023-09-01 DIAGNOSIS — R1030 Lower abdominal pain, unspecified: Secondary | ICD-10-CM | POA: Diagnosis not present

## 2023-09-01 DIAGNOSIS — S0003XA Contusion of scalp, initial encounter: Secondary | ICD-10-CM

## 2023-09-01 DIAGNOSIS — S0990XA Unspecified injury of head, initial encounter: Secondary | ICD-10-CM | POA: Diagnosis not present

## 2023-09-01 DIAGNOSIS — T7491XA Unspecified adult maltreatment, confirmed, initial encounter: Secondary | ICD-10-CM

## 2023-09-01 DIAGNOSIS — Z3A10 10 weeks gestation of pregnancy: Secondary | ICD-10-CM

## 2023-09-01 DIAGNOSIS — O3680X0 Pregnancy with inconclusive fetal viability, not applicable or unspecified: Secondary | ICD-10-CM

## 2023-09-01 DIAGNOSIS — O9A311 Physical abuse complicating pregnancy, first trimester: Secondary | ICD-10-CM | POA: Insufficient documentation

## 2023-09-01 LAB — CBC
HCT: 37.6 % (ref 36.0–46.0)
Hemoglobin: 12.2 g/dL (ref 12.0–15.0)
MCH: 30.7 pg (ref 26.0–34.0)
MCHC: 32.4 g/dL (ref 30.0–36.0)
MCV: 94.7 fL (ref 80.0–100.0)
Platelets: 326 10*3/uL (ref 150–400)
RBC: 3.97 MIL/uL (ref 3.87–5.11)
RDW: 14.2 % (ref 11.5–15.5)
WBC: 12.4 10*3/uL — ABNORMAL HIGH (ref 4.0–10.5)
nRBC: 0 % (ref 0.0–0.2)

## 2023-09-01 LAB — WET PREP, GENITAL
Clue Cells Wet Prep HPF POC: NONE SEEN
Sperm: NONE SEEN
Trich, Wet Prep: NONE SEEN
WBC, Wet Prep HPF POC: <10 (ref <10)
Yeast Wet Prep HPF POC: NONE SEEN

## 2023-09-01 LAB — POCT PREGNANCY, URINE: Preg Test, Ur: POSITIVE — AB

## 2023-09-01 LAB — HCG, QUANTITATIVE, PREGNANCY: hCG, Beta Chain, Quant, S: 527 m[IU]/mL — ABNORMAL HIGH (ref <5)

## 2023-09-01 MED ORDER — ACETAMINOPHEN 500 MG PO TABS
1000.0000 mg | ORAL_TABLET | Freq: Once | ORAL | Status: AC
  Administered 2023-09-01: 1000 mg via ORAL
  Filled 2023-09-01: qty 2

## 2023-09-01 MED ORDER — PREPLUS 27-1 MG PO TABS: 1.0000 | ORAL_TABLET | Freq: Every day | ORAL | 13 refills | Status: DC
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
## 2023-09-03 LAB — GC/CHLAMYDIA PROBE AMP (~~LOC~~) NOT AT ARMC
Chlamydia: NEGATIVE
Comment: NEGATIVE
Comment: NORMAL
Neisseria Gonorrhea: NEGATIVE

## 2023-09-04 ENCOUNTER — Other Ambulatory Visit: Payer: Self-pay

## 2023-09-18 ENCOUNTER — Ambulatory Visit (INDEPENDENT_AMBULATORY_CARE_PROVIDER_SITE_OTHER): Admitting: *Deleted

## 2023-09-18 ENCOUNTER — Other Ambulatory Visit (INDEPENDENT_AMBULATORY_CARE_PROVIDER_SITE_OTHER): Payer: Self-pay

## 2023-09-18 VITALS — BP 115/68 | HR 91 | Wt 199.4 lb

## 2023-09-18 DIAGNOSIS — O0990 Supervision of high risk pregnancy, unspecified, unspecified trimester: Secondary | ICD-10-CM | POA: Diagnosis not present

## 2023-09-18 DIAGNOSIS — Z3A01 Less than 8 weeks gestation of pregnancy: Secondary | ICD-10-CM | POA: Diagnosis not present

## 2023-09-18 DIAGNOSIS — Z1331 Encounter for screening for depression: Secondary | ICD-10-CM | POA: Diagnosis not present

## 2023-09-18 DIAGNOSIS — Z3A12 12 weeks gestation of pregnancy: Secondary | ICD-10-CM | POA: Diagnosis not present

## 2023-09-18 DIAGNOSIS — O0991 Supervision of high risk pregnancy, unspecified, first trimester: Secondary | ICD-10-CM | POA: Diagnosis not present

## 2023-09-18 DIAGNOSIS — O3680X0 Pregnancy with inconclusive fetal viability, not applicable or unspecified: Secondary | ICD-10-CM

## 2023-09-18 MED ORDER — DOXYLAMINE-PYRIDOXINE 10-10 MG PO TBEC: 2.0000 | DELAYED_RELEASE_TABLET | Freq: Every day | ORAL | 5 refills | Status: DC

## 2023-09-18 MED ORDER — VITAFOL GUMMIES 3.33-0.333-34.8 MG PO CHEW: 3.0000 | CHEWABLE_TABLET | Freq: Every day | ORAL | 11 refills | Status: AC

## 2023-09-18 MED ORDER — PROMETHAZINE HCL 25 MG PO TABS: 25.0000 mg | ORAL_TABLET | Freq: Four times a day (QID) | ORAL | 1 refills | Status: DC | PRN

## 2023-09-18 NOTE — Patient Instructions (Signed)

## 2023-09-18 NOTE — Progress Notes (Signed)
 New OB Intake  I connected with Alyssa Howell  on 09/18/23 at  2:10 PM EDT by In Person Visit and verified that I am speaking with the correct person using two identifiers. Nurse is located at [facility] and pt is located at [facility].  I discussed the limitations, risks, security and privacy concerns of performing an evaluation and management service by telephone and the availability of in person appointments. I also discussed with the patient that there may be a patient responsible charge related to this service. The patient expressed understanding and agreed to proceed.  I explained I am completing New OB Intake today. We discussed EDD of 03/27/2024, by Last Menstrual Period. Pt is [id number]. I reviewed her allergies, medications and Medical/Surgical/OB history.    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 Discussed the nature of our practice with multiple providers including residents and students. Due to the size of the practice, the delivering provider may not be the same as those providing prenatal care.   Patient not asked about interest in water birth. Please follow up.  MyChart/Babyscripts MyChart access verified. I explained pt will have some visits in office and some virtually. Babyscripts instructions given and order placed. Patient verifies receipt of registration text/e-mail. Account successfully created and app downloaded. If patient is a candidate for Optimized scheduling, add to sticky note.   Blood Pressure Cuff/Weight Scale Pt has BP cuff at home. Explained  after first prenatal appt pt will check weekly and document in Babyscripts. Patient does not have weight scale; patient may purchase if they desire to track weight weekly in Babyscripts.  Anatomy US  Explained first scheduled US  will be around 19 weeks. Anatomy US  scheduled for TBD at TBD.  Is patient a candidate for Babyscripts Optimization? No, due to Risk Factors.   First visit review I reviewed new OB appt with patient. Explained pt will be seen by Dr. Dodie Frees at first visit. Discussed [place] genetic screening with patient. Requests Panorama. Routine prenatal labs OB Urine only collected at today's visit. Initial OB labs deferred to New OB appt.   Last Pap No results found for: "DIAGPAP"  Donette Furlong, RN 09/18/2023  1:59 PM

## 2023-09-20 LAB — CULTURE, OB URINE

## 2023-09-20 LAB — URINE CULTURE, OB REFLEX

## 2023-10-04 DIAGNOSIS — O219 Vomiting of pregnancy, unspecified: Secondary | ICD-10-CM | POA: Diagnosis not present

## 2023-10-04 DIAGNOSIS — Z3A01 Less than 8 weeks gestation of pregnancy: Secondary | ICD-10-CM | POA: Diagnosis not present

## 2023-10-04 DIAGNOSIS — J020 Streptococcal pharyngitis: Secondary | ICD-10-CM | POA: Diagnosis not present

## 2023-10-04 DIAGNOSIS — R1011 Right upper quadrant pain: Secondary | ICD-10-CM | POA: Diagnosis not present

## 2023-10-04 NOTE — ED Triage Notes (Addendum)
 [redacted] weeks pregnant complaining of nausea and vomiting due to pregnancy and productive white cough, sore throat, headache, body aches, fever, RUQ that radiates into her back.  Tylenol  1330.  G2P1.  GCS 15.  Denies vaginal bleeding, discharge, or urinary symptoms.   Phenergan  at home without relief.  Prenatal care out of town.  Son has strep throat.

## 2023-10-05 DIAGNOSIS — O219 Vomiting of pregnancy, unspecified: Secondary | ICD-10-CM | POA: Diagnosis not present

## 2023-10-05 DIAGNOSIS — Z3A01 Less than 8 weeks gestation of pregnancy: Secondary | ICD-10-CM | POA: Diagnosis not present

## 2023-10-05 DIAGNOSIS — J020 Streptococcal pharyngitis: Secondary | ICD-10-CM | POA: Diagnosis not present

## 2023-10-15 ENCOUNTER — Encounter: Admitting: Obstetrics and Gynecology

## 2023-10-15 DIAGNOSIS — O0990 Supervision of high risk pregnancy, unspecified, unspecified trimester: Secondary | ICD-10-CM

## 2023-10-15 DIAGNOSIS — Z3A11 11 weeks gestation of pregnancy: Secondary | ICD-10-CM

## 2023-10-24 DIAGNOSIS — Z3491 Encounter for supervision of normal pregnancy, unspecified, first trimester: Secondary | ICD-10-CM | POA: Diagnosis not present

## 2023-10-24 DIAGNOSIS — Z36 Encounter for antenatal screening for chromosomal anomalies: Secondary | ICD-10-CM | POA: Diagnosis not present

## 2023-10-24 DIAGNOSIS — Z8751 Personal history of pre-term labor: Secondary | ICD-10-CM | POA: Diagnosis not present

## 2023-10-24 DIAGNOSIS — Z3201 Encounter for pregnancy test, result positive: Secondary | ICD-10-CM | POA: Diagnosis not present

## 2023-10-24 DIAGNOSIS — Z3A12 12 weeks gestation of pregnancy: Secondary | ICD-10-CM | POA: Diagnosis not present

## 2023-10-24 DIAGNOSIS — Z31430 Encounter of female for testing for genetic disease carrier status for procreative management: Secondary | ICD-10-CM | POA: Diagnosis not present

## 2023-10-24 DIAGNOSIS — Z124 Encounter for screening for malignant neoplasm of cervix: Secondary | ICD-10-CM | POA: Diagnosis not present

## 2023-10-24 DIAGNOSIS — Z113 Encounter for screening for infections with a predominantly sexual mode of transmission: Secondary | ICD-10-CM | POA: Diagnosis not present

## 2023-10-29 ENCOUNTER — Inpatient Hospital Stay (HOSPITAL_COMMUNITY)
Admission: AD | Admit: 2023-10-29 | Discharge: 2023-10-29 | Disposition: A | Discharge status: Home / Self Care | Attending: Obstetrics and Gynecology | Admitting: Obstetrics and Gynecology

## 2023-10-29 ENCOUNTER — Inpatient Hospital Stay (HOSPITAL_COMMUNITY)

## 2023-10-29 ENCOUNTER — Encounter (HOSPITAL_COMMUNITY): Payer: Self-pay | Admitting: Obstetrics and Gynecology

## 2023-10-29 DIAGNOSIS — R197 Diarrhea, unspecified: Secondary | ICD-10-CM | POA: Diagnosis not present

## 2023-10-29 DIAGNOSIS — O26611 Liver and biliary tract disorders in pregnancy, first trimester: Secondary | ICD-10-CM | POA: Insufficient documentation

## 2023-10-29 DIAGNOSIS — R079 Chest pain, unspecified: Secondary | ICD-10-CM | POA: Diagnosis present

## 2023-10-29 DIAGNOSIS — O99611 Diseases of the digestive system complicating pregnancy, first trimester: Secondary | ICD-10-CM | POA: Diagnosis not present

## 2023-10-29 DIAGNOSIS — K219 Gastro-esophageal reflux disease without esophagitis: Secondary | ICD-10-CM | POA: Diagnosis not present

## 2023-10-29 DIAGNOSIS — Z3A12 12 weeks gestation of pregnancy: Secondary | ICD-10-CM

## 2023-10-29 DIAGNOSIS — O0990 Supervision of high risk pregnancy, unspecified, unspecified trimester: Secondary | ICD-10-CM

## 2023-10-29 DIAGNOSIS — R634 Abnormal weight loss: Secondary | ICD-10-CM | POA: Insufficient documentation

## 2023-10-29 DIAGNOSIS — O99891 Other specified diseases and conditions complicating pregnancy: Secondary | ICD-10-CM | POA: Insufficient documentation

## 2023-10-29 DIAGNOSIS — O0991 Supervision of high risk pregnancy, unspecified, first trimester: Secondary | ICD-10-CM | POA: Insufficient documentation

## 2023-10-29 DIAGNOSIS — M940 Chondrocostal junction syndrome [Tietze]: Secondary | ICD-10-CM | POA: Diagnosis not present

## 2023-10-29 DIAGNOSIS — R1011 Right upper quadrant pain: Secondary | ICD-10-CM | POA: Diagnosis not present

## 2023-10-29 DIAGNOSIS — O26891 Other specified pregnancy related conditions, first trimester: Secondary | ICD-10-CM

## 2023-10-29 DIAGNOSIS — K824 Cholesterolosis of gallbladder: Secondary | ICD-10-CM | POA: Insufficient documentation

## 2023-10-29 DIAGNOSIS — K828 Other specified diseases of gallbladder: Secondary | ICD-10-CM | POA: Diagnosis not present

## 2023-10-29 LAB — URINALYSIS, ROUTINE W REFLEX MICROSCOPIC
Bilirubin Urine: NEGATIVE
Glucose, UA: NEGATIVE mg/dL
Ketones, ur: NEGATIVE mg/dL
Nitrite: NEGATIVE
Protein, ur: NEGATIVE mg/dL
Specific Gravity, Urine: 1.024 (ref 1.005–1.030)
pH: 5 (ref 5.0–8.0)

## 2023-10-29 LAB — COMPREHENSIVE METABOLIC PANEL WITH GFR
ALT: 13 U/L (ref 0–44)
AST: 16 U/L (ref 15–41)
Albumin: 3.1 g/dL — ABNORMAL LOW (ref 3.5–5.0)
Alkaline Phosphatase: 57 U/L (ref 38–126)
Anion gap: 9 (ref 5–15)
BUN: 10 mg/dL (ref 6–20)
CO2: 22 mmol/L (ref 22–32)
Calcium: 9.1 mg/dL (ref 8.9–10.3)
Chloride: 107 mmol/L (ref 98–111)
Creatinine, Ser: 0.7 mg/dL (ref 0.44–1.00)
GFR, Estimated: >60 mL/min (ref >60)
Glucose, Bld: 86 mg/dL (ref 70–99)
Potassium: 4 mmol/L (ref 3.5–5.1)
Sodium: 138 mmol/L (ref 135–145)
Total Bilirubin: 0.4 mg/dL (ref 0.0–1.2)
Total Protein: 6.6 g/dL (ref 6.5–8.1)

## 2023-10-29 LAB — CBC
HCT: 33.2 % — ABNORMAL LOW (ref 36.0–46.0)
Hemoglobin: 11.1 g/dL — ABNORMAL LOW (ref 12.0–15.0)
MCH: 31.9 pg (ref 26.0–34.0)
MCHC: 33.4 g/dL (ref 30.0–36.0)
MCV: 95.4 fL (ref 80.0–100.0)
Platelets: 289 K/uL (ref 150–400)
RBC: 3.48 MIL/uL — ABNORMAL LOW (ref 3.87–5.11)
RDW: 13.6 % (ref 11.5–15.5)
WBC: 10.2 K/uL (ref 4.0–10.5)
nRBC: 0 % (ref 0.0–0.2)

## 2023-10-29 MED ORDER — FAMOTIDINE 20 MG PO TABS
40.0000 mg | ORAL_TABLET | Freq: Every day | ORAL | Status: DC
  Administered 2023-10-29: 40 mg via ORAL
  Filled 2023-10-29: qty 2

## 2023-10-29 MED ORDER — CYCLOBENZAPRINE HCL 10 MG PO TABS: 10.0000 mg | ORAL_TABLET | Freq: Once | ORAL | 0 refills | Status: AC

## 2023-10-29 MED ORDER — FAMOTIDINE 40 MG PO TABS
40.0000 mg | ORAL_TABLET | Freq: Every day | ORAL | 3 refills | Status: AC
Start: 2023-10-29 — End: ?

## 2023-10-29 MED ORDER — CYCLOBENZAPRINE HCL 5 MG PO TABS
10.0000 mg | ORAL_TABLET | Freq: Once | ORAL | Status: AC
  Administered 2023-10-29: 10 mg via ORAL
  Filled 2023-10-29: qty 2

## 2023-10-29 NOTE — Discharge Instructions (Signed)
 It was a pleasure taking care of you today.  I believe your symptoms are likely related to reflux as well as something called costochondritis.  For the reflux I have sent a medication called Pepcid  to your pharmacy which I want you to take daily.  For the costochondritis I have sent a muscle relaxer called Flexeril  which she can take as needed but I also recommend starting with Tylenol .  If the symptoms persist with your diarrhea and the acid reflux I recommend you seeing a gastroenterologist outpatient and you will need a referral from your primary care doctor for that.  If your symptoms worsen or you have any other concerns please return for further evaluation.  I hope you have a wonderful rest of your night!

## 2023-10-29 NOTE — MAU Note (Signed)
 Alyssa Howell is a 23 y.o. at [redacted]w[redacted]d here in MAU reporting: she is having chest pain/ discomfort that wrapped around her right breast and back. Went to urgent care in [place] [facility] told her she hmay have polyps on her gallbladder but did not tell her anything else and what to do about it. Pain had been having on and off for a few months. Pain is worse when she eats.  Pain is dull but then gets very sharp and intense after she eats.   LMP:  Onset of complaint: 1-2 months Pain score: 5-9 Vitals:   10/29/23 1830  BP: 112/64  Pulse: 94  Resp: 18  Temp: 98.7 F (37.1 C)     FHT: 153  Lab orders placed from triage: EKG, U/A

## 2023-10-29 NOTE — MAU Provider Note (Signed)
 Chief Complaint: Chest Pain   Event Date/Time   First Provider Initiated Contact with Patient 10/29/23 1912      SUBJECTIVE HPI: Alyssa Howell is a 23 y.o. [id number] at [redacted]w[redacted]d by early ultrasound who presents to maternity admissions reporting chest pain and diarrhea.  She has been having 5 months of chest pain primarily associated with eating she reports intense stabbing pain that causes her to catch her breath.  It is primarily substernal and radiates under her right breast to the back.  Initially she thought it was muscular in origin because she was moving around the time the pain began.  However, now she is uncertain.  She says this is the worst pain she has ever felt.  Worse than her kidney stones and labor.  No history of alcohol use.  She has previously had GERD and reports this feels different.  For about 5 months she has also been having Bristol stool type VI-VII bowel movements 3-5 times a day.  This is associated with eating she gets occasional left lower quadrant pain when she eats that is relieved when she defecates.  She notes 18 pounds of weight loss over this time.  Her stool color is dark and occasionally looks red.    Past Medical History:  Diagnosis Date   ADHD    Anemia    Chlamydia 2022   Depression    HSV-2 infection    per pt/ seen in ED [facility]   Kidney stones    Seizures (HCC)    x 1 in 5th grade after taking ability. No other episodes.   Vesico-ureteral reflux    Past Surgical History:  Procedure Laterality Date   CYSTOSCOPY/URETEROSCOPY/HOLMIUM LASER/STENT PLACEMENT Right 08/02/2020   Procedure: CYSTOSCOPY/URETEROSCOPY/HOLMIUM LASER/STENT PLACEMENT;  Surgeon: Penne Knee, MD;  Location: [facility] ORS;  Service: Urology;  Laterality: Right;   DILATION AND CURETTAGE OF UTERUS N/A 08/12/2022   Procedure: DILATATION AND CURETTAGE, ULTRASOUND GUIDED;  Surgeon: Zina Jerilynn LABOR, MD;  Location: [facility] OR;  Service: Gynecology;  Laterality: N/A;   FOOT SURGERY  Bilateral    metal screws in toes,2020   TONSILLECTOMY     Social History   Socioeconomic History   Marital status: Single    Spouse name: Not on file   Number of children: Not on file   Years of education: Not on file   Highest education level: Not on file  Occupational History   Not on file  Tobacco Use   Smoking status: Never   Smokeless tobacco: Never  Vaping Use   Vaping status: Never Used  Substance and Sexual Activity   Alcohol use: Not Currently   Drug use: Not Currently    Types: Marijuana    Comment: last used the end of September 2022 as of 02/07/2021   Sexual activity: Not Currently    Partners: Male  Other Topics Concern   Not on file  Social History Narrative   From age [age] the patient lived with adoptive parents, grandparents and foster families.  At age 18 her mom gain custody.  Her name is pronounced [name] .  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  She is not sure what she wants to do with her life.  Sexually active.  Does not use condoms 7 partners in 2020.   Social Drivers of Corporate investment banker Strain: Not on file  Food Insecurity: No Food Insecurity (08/12/2022)   Hunger Vital Sign    Worried About  Running Out of Food in the Last Year: Never true    Ran Out of Food in the Last Year: Never true  Transportation Needs: No Transportation Needs (08/12/2022)   PRAPARE - Administrator, Civil Service (Medical): No    Lack of Transportation (Non-Medical): No  Physical Activity: Not on file  Stress: Not on file  Social Connections: Not on file  Intimate Partner Violence: Not At Risk (08/12/2022)   Humiliation, Afraid, Rape, and Kick questionnaire    Fear of Current or Ex-Partner: No    Emotionally Abused: No    Physically Abused: No    Sexually Abused: No   No current facility-administered medications on file prior to encounter.   Current Outpatient Medications on File Prior to Encounter  Medication Sig  Dispense Refill   ondansetron  (ZOFRAN ) 4 MG tablet Take 4 mg by mouth every 8 (eight) hours as needed for nausea or vomiting.     Prenatal Vit-Fe Phos-FA-Omega (VITAFOL  GUMMIES) 3.33-0.333-34.8 MG CHEW Chew 3 tablets by mouth daily. 90 tablet 11   promethazine  (PHENERGAN ) 25 MG tablet Take 1 tablet (25 mg total) by mouth every 6 (six) hours as needed for nausea or vomiting. 30 tablet 1   Doxylamine -Pyridoxine  (DICLEGIS ) 10-10 MG TBEC Take 2 tablets by mouth at bedtime. If symptoms persist, add one tablet in the morning and one in the afternoon 100 tablet 5   Allergies  Allergen Reactions   Abilify [Aripiprazole] Other (See Comments)    Seizures    ROS:  Pertinent positives/negatives listed above.  I have reviewed patient's Past Medical Hx, Surgical Hx, Family Hx, Social Hx, medications and allergies.   Physical Exam  Patient Vitals for the past 24 hrs:  BP Temp Pulse Resp Height Weight  10/29/23 1830 112/64 98.7 F (37.1 C) 94 18 5' 6 (1.676 m) 87.5 kg   Constitutional: Well-developed, well-nourished female in no acute distress.  Cardiovascular: normal rate, no murmurs, rubs, gallops Respiratory: normal effort, CTAB GI: Abd soft, Pos BS x 4, moderate tenderness in the LUQ and RUQ, (-) murphy's sign, no rebound, mild guarding MS: Extremities nontender, no edema, tenderness over the lower half of the costochondral junctions bilaterally that replicates her chest pain.  Neurologic: Alert and oriented x 4.   Doppler: 153 bpm  LAB RESULTS Results for orders placed or performed during the hospital encounter of 10/29/23 (from the past 24 hours)  Urinalysis, Routine w reflex microscopic -Urine, Clean Catch     Status: Abnormal   Collection Time: 10/29/23  7:05 PM  Result Value Ref Range   Color, Urine YELLOW YELLOW   APPearance HAZY (A) CLEAR   Specific Gravity, Urine 1.024 1.005 - 1.030   pH 5.0 5.0 - 8.0   Glucose, UA NEGATIVE NEGATIVE mg/dL   Hgb urine dipstick SMALL (A) NEGATIVE    Bilirubin Urine NEGATIVE NEGATIVE   Ketones, ur NEGATIVE NEGATIVE mg/dL   Protein, ur NEGATIVE NEGATIVE mg/dL   Nitrite NEGATIVE NEGATIVE   Leukocytes,Ua SMALL (A) NEGATIVE   RBC / HPF 6-10 0 - 5 RBC/hpf   WBC, UA 11-20 0 - 5 WBC/hpf   Bacteria, UA FEW (A) NONE SEEN   Squamous Epithelial / HPF 6-10 0 - 5 /HPF   Mucus PRESENT    Ca Oxalate Crys, UA PRESENT   CBC     Status: Abnormal   Collection Time: 10/29/23  8:19 PM  Result Value Ref Range   WBC 10.2 4.0 - 10.5 K/uL   RBC 3.48 (  L) 3.87 - 5.11 MIL/uL   Hemoglobin 11.1 (L) 12.0 - 15.0 g/dL   HCT 66.7 (L) 63.9 - 53.9 %   MCV 95.4 80.0 - 100.0 fL   MCH 31.9 26.0 - 34.0 pg   MCHC 33.4 30.0 - 36.0 g/dL   RDW 86.3 88.4 - 84.4 %   Platelets 289 150 - 400 K/uL   nRBC 0.0 0.0 - 0.2 %  Comprehensive metabolic panel     Status: Abnormal   Collection Time: 10/29/23  8:19 PM  Result Value Ref Range   Sodium 138 135 - 145 mmol/L   Potassium 4.0 3.5 - 5.1 mmol/L   Chloride 107 98 - 111 mmol/L   CO2 22 22 - 32 mmol/L   Glucose, Bld 86 70 - 99 mg/dL   BUN 10 6 - 20 mg/dL   Creatinine, Ser 9.29 0.44 - 1.00 mg/dL   Calcium 9.1 8.9 - 89.6 mg/dL   Total Protein 6.6 6.5 - 8.1 g/dL   Albumin  3.1 (L) 3.5 - 5.0 g/dL   AST 16 15 - 41 U/L   ALT 13 0 - 44 U/L   Alkaline Phosphatase 57 38 - 126 U/L   Total Bilirubin 0.4 0.0 - 1.2 mg/dL   GFR, Estimated >39 >39 mL/min   Anion gap 9 5 - 15       IMAGING US  Abdomen Limited RUQ (LIVER/GB) Result Date: 10/29/2023 CLINICAL DATA:  Right upper quadrant pain and postprandial pain. EXAM: ULTRASOUND ABDOMEN LIMITED RIGHT UPPER QUADRANT COMPARISON:  None Available. FINDINGS: Gallbladder: No gallstones or wall thickening visualized (1.8 mm). Two nonshadowing echogenic gallbladder polyps are seen along the nondependent wall of the gallbladder lumen. The largest measures approximately 6.9 mm. No sonographic Murphy sign noted by sonographer. Common bile duct: Diameter: 2.7 mm Liver: No focal lesion  identified. Within normal limits in parenchymal echogenicity. Portal vein is patent on color Doppler imaging with normal direction of blood flow towards the liver. Other: None. IMPRESSION: Small, likely benign gallbladder polyps, as described above. No follow-up imaging is recommended. This recommendation follows ACR consensus guidelines: White Paper of the ACR Incidental Findings Committee II on Gallbladder and Biliary Findings. J Am Coll Radiol 2013:;10:953-956. Electronically Signed   By: Suzen Dials M.D.   On: 10/29/2023 20:29    MAU Management/MDM: Orders Placed This Encounter  Procedures   US  Abdomen Limited RUQ (LIVER/GB)   Urinalysis, Routine w reflex microscopic -Urine, Clean Catch   CBC   Comprehensive metabolic panel   Occult blood card to lab, stool   ED EKG   Discharge patient Discharge disposition: 01-Home or Self Care; Discharge patient date: 10/29/2023    Meds ordered this encounter  Medications   famotidine  (PEPCID ) tablet 40 mg   cyclobenzaprine  (FLEXERIL ) tablet 10 mg   cyclobenzaprine  (FLEXERIL ) 10 MG tablet    Sig: Take 1 tablet (10 mg total) by mouth once for 1 dose.    Dispense:  20 tablet    Refill:  0   famotidine  (PEPCID ) 40 MG tablet    Sig: Take 1 tablet (40 mg total) by mouth daily.    Dispense:  90 tablet    Refill:  3     - EKG wnl, chest pain unlikely to be cardiac in nature - TTP of the costochondral junction that replicates her pain; likely costochondritis given exam and history indicating worsening pain with movement, though potentially related to GERD given timing with meals - RUQ US  ordered - CBC, FOBT ordered given concern  for blood in her stool - CMP ordered - Care handed off to Steffan Rover, MD at 2000  ASSESSMENT 1. Costochondritis   2. Supervision of high risk pregnancy, antepartum   3. [redacted] weeks gestation of pregnancy   4. Gastroesophageal reflux disease, unspecified whether esophagitis present   5. Weight loss, unintentional    6. Diarrhea, unspecified type     PLAN Discharge home with strict return precautions. Allergies as of 10/29/2023       Reactions   Abilify [aripiprazole] Other (See Comments)   Seizures        Medication List     TAKE these medications    cyclobenzaprine  10 MG tablet Commonly known as: FLEXERIL  Take 1 tablet (10 mg total) by mouth once for 1 dose.   Doxylamine -Pyridoxine  10-10 MG Tbec Commonly known as: Diclegis  Take 2 tablets by mouth at bedtime. If symptoms persist, add one tablet in the morning and one in the afternoon   famotidine  40 MG tablet Commonly known as: PEPCID  Take 1 tablet (40 mg total) by mouth daily.   ondansetron  4 MG tablet Commonly known as: ZOFRAN  Take 4 mg by mouth every 8 (eight) hours as needed for nausea or vomiting.   promethazine  25 MG tablet Commonly known as: PHENERGAN  Take 1 tablet (25 mg total) by mouth every 6 (six) hours as needed for nausea or vomiting.   Vitafol  Gummies 3.33-0.333-34.8 MG Chew Chew 3 tablets by mouth daily.       Reagan Alena Morrison, MD Family Medicine PGY1 10/29/2023  8:07 PM   Update  Results from ultrasound showing likely benign gallbladder polyps.  On reevaluation the patient she reports she is still having pain around the right edge of her sternum which sometimes radiates to her right side.  Tender to palpation.  Likely costochondritis.  She may also have a component of acid reflux due to the fact that it worsens after eating and when she lays down.  Patient given Pepcid  and Flexeril .  CMP results came back within normal limits.  Prescription sent to patient's pharmacy and recommended she take Pepcid  daily.  Discussed with the patient that she may need to follow-up with GI outpatient and she should discuss this with primary OB provider for GI referral if symptoms persist.  Discussed tricked return precautions.  Patient discharged home.  Rudy Domek V Shaida Route, MD 10/29/2023  9:14 PM

## 2023-12-14 ENCOUNTER — Ambulatory Visit: Admitting: Advanced Practice Midwife

## 2023-12-14 ENCOUNTER — Encounter: Payer: Self-pay | Admitting: Advanced Practice Midwife

## 2023-12-14 ENCOUNTER — Other Ambulatory Visit (HOSPITAL_COMMUNITY)
Admission: RE | Admit: 2023-12-14 | Discharge: 2023-12-14 | Disposition: A | Discharge status: Home / Self Care | Source: Ambulatory Visit | Attending: Advanced Practice Midwife | Admitting: Advanced Practice Midwife

## 2023-12-14 VITALS — BP 122/67 | HR 89 | Wt 196.7 lb

## 2023-12-14 DIAGNOSIS — O0990 Supervision of high risk pregnancy, unspecified, unspecified trimester: Secondary | ICD-10-CM

## 2023-12-14 DIAGNOSIS — Z0283 Encounter for blood-alcohol and blood-drug test: Secondary | ICD-10-CM

## 2023-12-14 DIAGNOSIS — Z131 Encounter for screening for diabetes mellitus: Secondary | ICD-10-CM | POA: Diagnosis not present

## 2023-12-14 DIAGNOSIS — Z1379 Encounter for other screening for genetic and chromosomal anomalies: Secondary | ICD-10-CM

## 2023-12-14 DIAGNOSIS — Z3A19 19 weeks gestation of pregnancy: Secondary | ICD-10-CM | POA: Diagnosis not present

## 2023-12-14 DIAGNOSIS — O0992 Supervision of high risk pregnancy, unspecified, second trimester: Secondary | ICD-10-CM

## 2023-12-14 DIAGNOSIS — Z8279 Family history of other congenital malformations, deformations and chromosomal abnormalities: Secondary | ICD-10-CM | POA: Insufficient documentation

## 2023-12-14 DIAGNOSIS — Z124 Encounter for screening for malignant neoplasm of cervix: Secondary | ICD-10-CM | POA: Diagnosis not present

## 2023-12-14 DIAGNOSIS — Z113 Encounter for screening for infections with a predominantly sexual mode of transmission: Secondary | ICD-10-CM

## 2023-12-14 DIAGNOSIS — O09899 Supervision of other high risk pregnancies, unspecified trimester: Secondary | ICD-10-CM | POA: Insufficient documentation
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
## 2023-12-14 NOTE — Patient Instructions (Signed)
 Pregnancy: Healthy Eating While you're pregnant, your body needs extra nutrition for your growing baby. You also need more vitamins and minerals, such as folic acid, calcium, iron, and vitamin D. Eating a balanced diet is important for both you and your baby. Your need for extra calories will change during pregnancy. During the first 3 months of pregnancy, called the first trimester, you don't need more calories. During the second trimester, you'll need about 340 extra calories a day. During the third trimester, you'll need about 450 extra calories a day. If you're carrying more than one baby, talk with your health care provider or a dietitian to learn more about your specific eating needs. What are tips for eating healthy during pregnancy? Meal planning  Eating smaller meals throughout the day may help manage some side effects common in pregnancy, like heartburn and reflux. Eat a variety of foods. Be sure to include many types of fruits and vegetables. Two or more servings of fish are recommended each week. Choose fish that are lower in mercury, such as salmon and pollock. Limit foods that have "empty calories." These are foods that have little nutritional value, such as sweets, desserts, candies, and drinks with sugar in them. Drinks that have caffeine are OK to drink, but it's better to avoid caffeine. Limit your total caffeine intake to less than 200 mg each day, or the limit you're told by your provider. Be aware that 200 mg of caffeine is 12 oz or 355 mL of coffee, tea, or soda. General information Take a prenatal vitamin to help meet your vitamin and mineral needs during pregnancy. This includes your need for folic acid, iron, calcium, and vitamin D. Do not try to lose weight or go on a diet during pregnancy. Food safety  Wash your hands before you eat and after you prepare raw meat. Wash all fruits and vegetables well before peeling or eating. Make sure that all meats, poultry, and  eggs are cooked to food-safe temperatures or "well-done." Taking these actions can help keep your food safe and protect you and your baby from dangerous food illnesses. Ask your provider for more information. What foods should I eat? Fruits All fruits. Eat a variety of colors and types of fruit. Remember to wash your fruits well before peeling or eating. Vegetables All vegetables. Eat a variety of colors and types of vegetables. Remember to wash your vegetables well before peeling or eating. Grains All grains. Choose whole grains, such as whole-wheat bread, oatmeal, or brown rice. Meats and other protein foods Lean meats, including chicken, [place], and lean cuts of beef, veal, or pork. Fish that is higher in omega-3 fatty acids and lower in mercury, such as salmon, herring, mussels, trout, sardines, pollock, shrimp, crab, and lobster. Tofu. Tempeh. Beans. Eggs. Peanut butter and other nut butters. Dairy Pasteurized milk and milk alternatives, such as soy milk. Pasteurized yogurt and pasteurized cheese. Cottage cheese. Sour cream. Beverages Water. Juices that contain 100% fruit juice or vegetable juice. Caffeine-free teas and decaffeinated coffee. Fats and oils Fats and oils are OK to include in moderation. Sweets and desserts Sweets and desserts are OK to include in moderation. Seasoning and other foods All pasteurized condiments. The items listed above may not be all the foods and drinks you can have. Talk with a dietitian to learn more. What does 340 extra calories look like? Healthy snacks that give you 340 more calories a day could be: Peanut butter and jelly with milk: 8 oz (237 mL) of  low-fat milk. Peanut butter and jelly sandwich made with: 1 slice of whole-wheat bread. 2 teaspoons (10 g) of peanut butter. Yogurt and berries: 1 cup (245 g) of [place] yogurt. 1 cup (150 g) of berries. 2 tablespoons (30 g) of chopped nuts, such as almonds or walnuts. Avocado toast: 1 slice of  whole-wheat bread. 1/2 medium avocado (70 g). 1 large egg (50 g). What foods should I avoid? Fruits Raw (unpasteurized) fruit juices. Vegetables Unpasteurized vegetable juices. Meats and other protein foods Precooked or cured meat, such as bologna, hot dogs, sausages, or meat loaves. (If you must eat those meats, reheat them until they are steaming hot.) Refrigerated pate, meat spreads from a meat counter, or smoked seafood that's found in the refrigerated section of a store. Raw or undercooked meats, poultry, and eggs. Raw fish, such as sushi or sashimi. Fish that have high mercury content, such as tilefish, shark, swordfish, and king mackerel. Dairy Unpasteurized or raw milk and any foods that are made from them. Some of these may be: Homemade yogurts or puddings. Soft cheeses such as: Feta. Queso blanco or fresco. Pharmacist, hospital or [place]. Blue-veined cheeses. Some of these types of cheeses may be made with pasteurized milk. Check the label. If pasteurized milk is used, they are OK to eat during pregnancy. Deli foods Premade foods from a store or deli, like chicken salad, coleslaw, or egg salad. These are riskier for food illness than fresh or homemade salads. Beverages Alcohol. Sugar-sweetened drinks, such as sodas or teas. Energy drinks. Seasoning and other foods Homemade fermented foods and drinks, such as: Pickles. Sauerkraut. Kombucha. Store-bought pasteurized versions of these are OK. The items listed above may not be all the foods and drinks you should avoid. Talk with a dietitian to learn more. Where to find more information To learn more, go to: Centers for Disease Control and Prevention at TonerPromos.no. Click "Search" and type "food choices for pregnancy." Find the link you need. MyPlate at http://pittman-dennis.biz/. This information is not intended to replace advice given to you by your health care provider. Make sure you discuss any questions you have with your health care  provider. Document Revised: 03/28/2023 Document Reviewed: 03/28/2023 Elsevier Patient Education  2025 Elsevier Inc.Exercise During Pregnancy Exercise is an important part of being healthy for people of all ages. Exercise helps your heart and lungs work well. Exercise also: Helps you stay strong and flexible. Helps you keep a healthy body weight. Boosts your energy levels and improves your mood. You should try to exercise regularly during pregnancy. Exercise routines may need to change later in your pregnancy. In rare cases, certain medical problems in your pregnancy may limit the exercise you can do during pregnancy. Your health care provider will give you information on what exercises will work for you. How does exercise help during pregnancy? Along with staying strong and flexible, exercising during pregnancy can help: Keep strength in muscles that are used during labor and birth. Control weight gain. Speed up your recovery after giving birth. Reduce the need for insulin if you get diabetes during pregnancy. Decrease low back pain. Lower the risk for depression. Lower the risk of cesarean delivery. Treat trouble pooping (constipation). How does exercise affect my baby? Exercise can help you have a healthy pregnancy. Exercise does not cause your baby to be born early. It will not cause your baby to weigh less at birth. What exercises can I do? Many exercises are safe for you to do during pregnancy. Do a variety of  exercises that safely increase your heart and breathing rates and help you build and maintain muscle strength. Do exercises as told by your provider. Your provider may recommend: Walking. Swimming. Water aerobics. Riding a stationary bike. Modified yoga or Pilates. Tell your instructor that you're pregnant. Avoid overstretching. Avoid lying on your back for long periods of time. Resistance exercises with weights or elastic bands. Running or jogging. Choose this type of  exercise only if: You ran or jogged regularly before your pregnancy. You can run or jog and still talk in full sentences. What exercises should I avoid? You may be told to limit high-intensity exercise depending on your level of fitness and if you exercised regularly before you became pregnant. You can tell that you're exercising at a high intensity if you're breathing much harder and faster and can't hold a conversation while exercising. You may be told to: Avoid jogging or running, unless you jogged or ran regularly before you became pregnant. Do not run or jog so fast that you're unable to have a conversation. Avoid activities that put you at risk for falling on your belly or getting hit in the belly. Some of these are: Downhill skiing. Rock climbing. Cycling and gymnastics. Horseback riding. Surfing and waterskiing. Contact sports. Avoid scuba diving. Avoid skydiving. Avoid activities that take place in a room that's heated to high temperatures, such as hot yoga or hot Pilates. How do I exercise in a safe way?  Start slowly. Ask your provider to recommend the types of exercise that are safe for you. Avoid overheating. Do not exercise in very high temperatures or hot rooms. Avoid hot yoga or hot Pilates. Avoid standing still or lying flat on your back as much as you can. Avoid losing too much fluid (dehydration). Drink more fluids as told. Drink before, during, and after you exercise. Avoid overstretching. Because of hormone changes during pregnancy, it's easy to overstretch muscles, tendons, and ligaments. Ligaments are the tissues that connect bones to each other. Do not exercise to lose weight. Do not exercise at more than 6,000 feet above sea level (high elevation) if you don't live at that elevation. Tips and recommendations Wear loose-fitting, breathable clothes. Wear a sports bra to support your breasts. Exercise on most days or all days of the week. Try to exercise for 30  minutes a day, 5 days a week. If problems come up during your pregnancy, you provider may tell you to limit some exercises or to exercise less. If you have concerns, ask your provider. If you actively exercised before your pregnancy, your provider may tell you to continue to do moderate-intensity to high-intensity exercise. If you're just starting to exercise or didn't exercise much before your pregnancy, your provider may tell you to do low-intensity to moderate-intensity exercise. Questions to ask your health care provider Is exercise safe for me? What are signs that I should stop exercising? Does my health condition mean that I should not exercise during pregnancy? When should I avoid exercising during pregnancy? Stop exercising and contact a health care provider if: You have any unusual symptoms, such as: Mild contractions or cramps in the belly. Dizziness that does not go away when you rest. Headache. Pain and swelling of your calves. Bleeding or fluid leaking from your vagina. Stop exercising and get help right away if: You have: Chest pain. Shortness of breath. Sudden, severe pain in your low back or your belly. Regular, painful contractions before 37 weeks of pregnancy. These symptoms may be an  emergency. Call 911 right away. Do not wait to see if the symptoms will go away. Do not drive yourself to the hospital. This information is not intended to replace advice given to you by your health care provider. Make sure you discuss any questions you have with your health care provider. Document Revised: 12/04/2022 Document Reviewed: 12/04/2022 Elsevier Patient Education  2024 [employer].

## 2023-12-15 LAB — MICROSCOPIC EXAMINATION: Casts: NONE SEEN /LPF

## 2023-12-15 LAB — CBC/D/PLT+RPR+RH+ABO+RUBIGG...
Antibody Screen: NEGATIVE
Basophils Absolute: 0.1 x10E3/uL (ref 0.0–0.2)
Basos: 1 %
EOS (ABSOLUTE): 0.2 x10E3/uL (ref 0.0–0.4)
Eos: 2 %
HCV Ab: NONREACTIVE
HIV Screen 4th Generation wRfx: NONREACTIVE
Hematocrit: 34.1 % (ref 34.0–46.6)
Hemoglobin: 11.3 g/dL (ref 11.1–15.9)
Hepatitis B Surface Ag: NEGATIVE
Immature Grans (Abs): 0 x10E3/uL (ref 0.0–0.1)
Immature Granulocytes: 0 %
Lymphocytes Absolute: 2.7 x10E3/uL (ref 0.7–3.1)
Lymphs: 24 %
MCH: 31.9 pg (ref 26.6–33.0)
MCHC: 33.1 g/dL (ref 31.5–35.7)
MCV: 96 fL (ref 79–97)
Monocytes Absolute: 0.6 x10E3/uL (ref 0.1–0.9)
Monocytes: 6 %
Neutrophils Absolute: 7.9 x10E3/uL — ABNORMAL HIGH (ref 1.4–7.0)
Neutrophils: 67 %
Platelets: 323 x10E3/uL (ref 150–450)
RBC: 3.54 x10E6/uL — ABNORMAL LOW (ref 3.77–5.28)
RDW: 12.4 % (ref 11.7–15.4)
RPR Ser Ql: NONREACTIVE
Rh Factor: POSITIVE
Rubella Antibodies, IGG: 30.5 {index} (ref >0.99)
Varicella zoster IgG: REACTIVE
WBC: 11.6 x10E3/uL — ABNORMAL HIGH (ref 3.4–10.8)

## 2023-12-15 LAB — URINALYSIS, ROUTINE W REFLEX MICROSCOPIC
Bilirubin, UA: NEGATIVE
Glucose, UA: NEGATIVE
Ketones, UA: NEGATIVE
Nitrite, UA: NEGATIVE
RBC, UA: NEGATIVE
Specific Gravity, UA: 1.023 (ref 1.005–1.030)
Urobilinogen, Ur: 1 mg/dL (ref 0.2–1.0)
pH, UA: 6.5 (ref 5.0–7.5)

## 2023-12-15 LAB — NICOTINE SCREEN, URINE: Cotinine Ql Scrn, Ur: NEGATIVE ng/mL

## 2023-12-15 LAB — HCV INTERPRETATION

## 2023-12-16 LAB — AFP, SERUM, OPEN SPINA BIFIDA
AFP MoM: 1.22
AFP Value: 51.6 ng/mL
Gest. Age on Collection Date: 19 wk
Maternal Age At EDD: 22.9 a
OSBR Risk 1 IN: 6110
Test Results:: NEGATIVE
Weight: 193 [lb_av]

## 2023-12-16 LAB — HGB A1C W/O EAG: Hgb A1c MFr Bld: 5 % (ref 4.8–5.6)

## 2023-12-16 LAB — CULTURE, OB URINE

## 2023-12-16 LAB — URINE CULTURE, OB REFLEX

## 2023-12-17 ENCOUNTER — Encounter: Payer: Self-pay | Admitting: Advanced Practice Midwife

## 2023-12-17 ENCOUNTER — Ambulatory Visit: Payer: Self-pay | Admitting: Advanced Practice Midwife

## 2023-12-17 LAB — CYTOLOGY - PAP
Chlamydia: NEGATIVE
Comment: NEGATIVE
Comment: NEGATIVE
Comment: NEGATIVE
Comment: NORMAL
Diagnosis: NEGATIVE
High risk HPV: NEGATIVE
Neisseria Gonorrhea: NEGATIVE
Trichomonas: NEGATIVE

## 2023-12-18 LAB — MONITOR DRUG PROFILE 14(MW)
Amphetamine Scrn, Ur: NEGATIVE ng/mL
BARBITURATE SCREEN URINE: NEGATIVE ng/mL
BENZODIAZEPINE SCREEN, URINE: NEGATIVE ng/mL
Buprenorphine, Urine: NEGATIVE ng/mL
Cocaine (Metab) Scrn, Ur: NEGATIVE ng/mL
Creatinine(Crt), U: 154.6 mg/dL (ref 20.0–300.0)
Fentanyl, Urine: NEGATIVE pg/mL
Meperidine Screen, Urine: NEGATIVE ng/mL
Methadone Screen, Urine: NEGATIVE ng/mL
OXYCODONE+OXYMORPHONE UR QL SCN: NEGATIVE ng/mL
Opiate Scrn, Ur: NEGATIVE ng/mL
Ph of Urine: 6.4 (ref 4.5–8.9)
Phencyclidine Qn, Ur: NEGATIVE ng/mL
Propoxyphene Scrn, Ur: NEGATIVE ng/mL
SPECIFIC GRAVITY: 1.017
Tramadol Screen, Urine: NEGATIVE ng/mL

## 2023-12-18 LAB — CANNABINOID (GC/MS), URINE
Cannabinoid: POSITIVE — AB
Carboxy THC (GC/MS): 356 ng/mL

## 2023-12-20 ENCOUNTER — Ambulatory Visit

## 2023-12-20 NOTE — Progress Notes (Deleted)
    NURSE VISIT NOTE  Subjective:    Patient ID: Alyssa Howell, female    DOB: [birth year], 23 y.o.   MRN: [id number]  HPI  Patient is a 23 y.o. [id number] female who presents for {pe vag discharge desc:315065} vaginal discharge for *** {gen duration:315003}. Denies abnormal vaginal bleeding or significant pelvic pain or fever. {Actions; denies/reports/admits to:19208} {UTI Symptoms:210800002}. Patient {has/denies:315300} history of known exposure to STD.   Objective:    LMP 06/21/2023 (Approximate)    @THIS  VISIT ONLY@  Assessment:   No diagnosis found.  {vaginitis type:315262}  Plan:   GC and chlamydia DNA  probe sent to lab. Treatment: {vaginitis tx:315263} ROV prn if symptoms persist or worsen.   Rollo FORBES Louder, RN

## 2023-12-21 ENCOUNTER — Telehealth

## 2023-12-23 ENCOUNTER — Inpatient Hospital Stay (HOSPITAL_COMMUNITY)

## 2023-12-23 ENCOUNTER — Encounter (HOSPITAL_COMMUNITY): Payer: Self-pay | Admitting: Obstetrics & Gynecology

## 2023-12-23 ENCOUNTER — Inpatient Hospital Stay (HOSPITAL_COMMUNITY)
Admission: AD | Admit: 2023-12-23 | Discharge: 2023-12-23 | Disposition: A | Discharge status: Home / Self Care | Attending: Obstetrics & Gynecology | Admitting: Obstetrics & Gynecology

## 2023-12-23 ENCOUNTER — Other Ambulatory Visit: Payer: Self-pay

## 2023-12-23 ENCOUNTER — Inpatient Hospital Stay (HOSPITAL_BASED_OUTPATIENT_CLINIC_OR_DEPARTMENT_OTHER)

## 2023-12-23 DIAGNOSIS — Z3A20 20 weeks gestation of pregnancy: Secondary | ICD-10-CM

## 2023-12-23 DIAGNOSIS — O9A212 Injury, poisoning and certain other consequences of external causes complicating pregnancy, second trimester: Secondary | ICD-10-CM

## 2023-12-23 DIAGNOSIS — Z6790 Unspecified blood type, Rh positive: Secondary | ICD-10-CM

## 2023-12-23 DIAGNOSIS — W19XXXA Unspecified fall, initial encounter: Secondary | ICD-10-CM

## 2023-12-23 DIAGNOSIS — R1032 Left lower quadrant pain: Secondary | ICD-10-CM | POA: Insufficient documentation

## 2023-12-23 DIAGNOSIS — M25562 Pain in left knee: Secondary | ICD-10-CM | POA: Diagnosis not present

## 2023-12-23 DIAGNOSIS — S8002XA Contusion of left knee, initial encounter: Secondary | ICD-10-CM | POA: Diagnosis not present

## 2023-12-23 DIAGNOSIS — O368120 Decreased fetal movements, second trimester, not applicable or unspecified: Secondary | ICD-10-CM | POA: Diagnosis not present

## 2023-12-23 DIAGNOSIS — Y92009 Unspecified place in unspecified non-institutional (private) residence as the place of occurrence of the external cause: Secondary | ICD-10-CM

## 2023-12-23 DIAGNOSIS — W03XXXA Other fall on same level due to collision with another person, initial encounter: Secondary | ICD-10-CM | POA: Diagnosis not present

## 2023-12-23 MED ORDER — CYCLOBENZAPRINE HCL 10 MG PO TABS: 5.0000 mg | ORAL_TABLET | Freq: Three times a day (TID) | ORAL | 0 refills | Status: DC | PRN
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
## 2023-12-31 DIAGNOSIS — O99210 Obesity complicating pregnancy, unspecified trimester: Secondary | ICD-10-CM | POA: Insufficient documentation

## 2024-01-04 ENCOUNTER — Inpatient Hospital Stay (HOSPITAL_COMMUNITY)
Admission: AD | Admit: 2024-01-04 | Discharge: 2024-01-04 | Disposition: A | Discharge status: Home / Self Care | Payer: Self-pay | Attending: Obstetrics and Gynecology | Admitting: Obstetrics and Gynecology

## 2024-01-04 ENCOUNTER — Inpatient Hospital Stay (HOSPITAL_COMMUNITY)

## 2024-01-04 ENCOUNTER — Other Ambulatory Visit: Payer: Self-pay

## 2024-01-04 ENCOUNTER — Encounter (HOSPITAL_COMMUNITY): Payer: Self-pay | Admitting: Obstetrics and Gynecology

## 2024-01-04 DIAGNOSIS — O26832 Pregnancy related renal disease, second trimester: Secondary | ICD-10-CM | POA: Insufficient documentation

## 2024-01-04 DIAGNOSIS — Z87442 Personal history of urinary calculi: Secondary | ICD-10-CM | POA: Insufficient documentation

## 2024-01-04 DIAGNOSIS — O0990 Supervision of high risk pregnancy, unspecified, unspecified trimester: Secondary | ICD-10-CM

## 2024-01-04 DIAGNOSIS — R109 Unspecified abdominal pain: Secondary | ICD-10-CM | POA: Diagnosis not present

## 2024-01-04 DIAGNOSIS — Z3A22 22 weeks gestation of pregnancy: Secondary | ICD-10-CM | POA: Insufficient documentation

## 2024-01-04 DIAGNOSIS — N200 Calculus of kidney: Secondary | ICD-10-CM | POA: Diagnosis not present

## 2024-01-04 DIAGNOSIS — O09892 Supervision of other high risk pregnancies, second trimester: Secondary | ICD-10-CM | POA: Insufficient documentation

## 2024-01-04 DIAGNOSIS — O26892 Other specified pregnancy related conditions, second trimester: Secondary | ICD-10-CM | POA: Diagnosis present

## 2024-01-04 DIAGNOSIS — R1012 Left upper quadrant pain: Secondary | ICD-10-CM | POA: Diagnosis present

## 2024-01-04 LAB — URINALYSIS, ROUTINE W REFLEX MICROSCOPIC
Bilirubin Urine: NEGATIVE
Glucose, UA: NEGATIVE mg/dL
Ketones, ur: NEGATIVE mg/dL
Nitrite: NEGATIVE
Protein, ur: NEGATIVE mg/dL
Specific Gravity, Urine: 1.016 (ref 1.005–1.030)
pH: 5 (ref 5.0–8.0)

## 2024-01-04 LAB — COMPREHENSIVE METABOLIC PANEL WITH GFR
ALT: 14 U/L (ref 0–44)
AST: 15 U/L (ref 15–41)
Albumin: 2.7 g/dL — ABNORMAL LOW (ref 3.5–5.0)
Alkaline Phosphatase: 69 U/L (ref 38–126)
Anion gap: 11 (ref 5–15)
BUN: 13 mg/dL (ref 6–20)
CO2: 21 mmol/L — ABNORMAL LOW (ref 22–32)
Calcium: 8.7 mg/dL — ABNORMAL LOW (ref 8.9–10.3)
Chloride: 107 mmol/L (ref 98–111)
Creatinine, Ser: 0.67 mg/dL (ref 0.44–1.00)
GFR, Estimated: >60 mL/min (ref >60)
Glucose, Bld: 77 mg/dL (ref 70–99)
Potassium: 4.1 mmol/L (ref 3.5–5.1)
Sodium: 139 mmol/L (ref 135–145)
Total Bilirubin: 0.3 mg/dL (ref 0.0–1.2)
Total Protein: 6.2 g/dL — ABNORMAL LOW (ref 6.5–8.1)

## 2024-01-04 LAB — CBC
HCT: 35 % — ABNORMAL LOW (ref 36.0–46.0)
Hemoglobin: 11.5 g/dL — ABNORMAL LOW (ref 12.0–15.0)
MCH: 32.6 pg (ref 26.0–34.0)
MCHC: 32.9 g/dL (ref 30.0–36.0)
MCV: 99.2 fL (ref 80.0–100.0)
Platelets: 259 K/uL (ref 150–400)
RBC: 3.53 MIL/uL — ABNORMAL LOW (ref 3.87–5.11)
RDW: 13.2 % (ref 11.5–15.5)
WBC: 10.5 K/uL (ref 4.0–10.5)
nRBC: 0 % (ref 0.0–0.2)

## 2024-01-04 LAB — LIPASE, BLOOD: Lipase: 30 U/L (ref 11–51)

## 2024-01-04 MED ORDER — HYDROMORPHONE HCL 1 MG/ML IJ SOLN
1.0000 mg | Freq: Once | INTRAMUSCULAR | Status: AC
  Administered 2024-01-04: 1 mg via INTRAVENOUS
  Filled 2024-01-04: qty 1

## 2024-01-04 MED ORDER — LACTATED RINGERS IV BOLUS
1000.0000 mL | Freq: Once | INTRAVENOUS | Status: AC
  Administered 2024-01-04: 1000 mL via INTRAVENOUS

## 2024-01-04 MED ORDER — TAMSULOSIN HCL 0.4 MG PO CAPS: 0.4000 mg | ORAL_CAPSULE | Freq: Every day | ORAL | 0 refills | Status: DC

## 2024-01-04 MED ORDER — ONDANSETRON HCL 4 MG/2ML IJ SOLN
4.0000 mg | Freq: Once | INTRAMUSCULAR | Status: AC
  Administered 2024-01-04: 4 mg via INTRAVENOUS
  Filled 2024-01-04: qty 2

## 2024-01-04 MED ORDER — HYDROCODONE-ACETAMINOPHEN 5-325 MG PO TABS: 1.0000 | ORAL_TABLET | Freq: Four times a day (QID) | ORAL | 0 refills | Status: DC | PRN

## 2024-01-04 MED ORDER — ONDANSETRON HCL 4 MG PO TABS: 4.0000 mg | ORAL_TABLET | Freq: Three times a day (TID) | ORAL | 0 refills | Status: DC | PRN

## 2024-01-04 NOTE — Discharge Instructions (Signed)
 Alyssa Howell,  You came to the MAU (Maternity Assessment Unit) today for abdominal and back pain. We did a workup including labs, which did not show anything concerning. We also did an ultrasound of your kidneys, which did show stones in both kidneys. I believe your current pain is related to a kidney stone. We gave you pain medication and nausea medication while you were here.  I am prescribing a pain medication, hydrocodone -acetaminophen  5-325mg  to take over the next few days as needed for pain; you can take 1-2 tablets every 6 hours as needed. I am also prescribing Flomax , to take one tablet daily over the next 5 days. I have also refilled your Zofran  for nausea.  Reasons to return to the MAU: - You begin to experience significant abdominal pain not improved by pain medications - You start to experience contractions (squeezing sensation) - You experience vaginal bleeding or leakage of fluids - You do not feel your baby moving as much or at all - You develop a fever (> 100.18F or 38C)  Thank you for allowing me to be a part of your care! Alan Flies, MD [facility], PGY1 [facility] at [facility] [address] [place], [place] [address]

## 2024-01-04 NOTE — MAU Note (Signed)
 Alyssa Howell is a 23 y.o. at [redacted]w[redacted]d here in MAU reporting: she began having pain in left lateral abdomen that travels down into lower abdomen.  Reports pain started yesterday but has worsened.  States pain is constant and it's sharp and stabbing, worsens with urination. States she has a Hx of kidney stones with pregnancy.  Took Tylenol  325mg  x3 at 0530 this morning, pain unrelieved.  Denies VB or LOF.  Endorses +FM.  LMP: 06/21/2023 Onset of complaint: yesterday Pain score: 9 Vitals:   01/04/24 0926  BP: 112/63  Pulse: 99  Resp: 18  Temp: 98.4 F (36.9 C)  SpO2: 100%     FHT: 141 bpm  Lab orders placed from triage: UA

## 2024-01-04 NOTE — MAU Provider Note (Addendum)
 Chief Complaint:  Back Pain   HPI   Alyssa Howell is a 23 y.o. [id number] at [redacted]w[redacted]d who presents to maternity admissions reporting abdominal pain. Intermittent pain in upper left abdomen radiating around to back occurring for week+. Yesterday 9/11, pain became so bad to the point of tears. Radiates doiwn to left groin. Currently 8-9/10 severity, worsens with urination. Some nausea today associated with her pain. No V/D, no constipation (has had normal bowel movements, no pain with BMs). No other concerns. Tried heating pad, tylenol , hot shower; nothing help. Took Tylenol  325mg  x3 this morning (0530) without improvement.  Feels similar to prior kidney stones; only has had these in pregnancy. Drinks 3-4 yeti cups of water a day. No alcohol. Trying to eat less fatty foods.  Contractions: None  Vaginal bleeding: None  Fluid leakage: None  Fetal movement: Yes  Pregnancy Course: Receives care at [facility] for [employer] . Prenatal records reviewed. Hx kidney stones with pregnancy; hx genital herpes; hx GC, CT, Trich. 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   Past Medical History:  Diagnosis Date   ADHD    Anemia    Chlamydia 2022   Depression    GERD (gastroesophageal reflux disease) 10/09/2019   HSV-2 infection    per pt/ seen in ED [facility]   Kidney stones    Seizures (HCC)    x 1 in 5th grade after taking ability. No other episodes.   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   Vaginal pain at introitus  Referred to Pelvic Floor PT     OB History  Gravida Para Term Preterm AB Living  5 2 1 1 2 2   SAB IAB Ectopic Multiple Live Births  1 1 0 0 2    # Outcome Date GA Lbr Len/2nd Weight Sex Type Anes PTL Lv  5 Current           4 IAB 01/2023          3 Preterm 08/05/22 [redacted]w[redacted]d 26:50 / 00:32 2590 g M Vag-Spont EPI  LIV  2 Term  03/2021 [redacted]w[redacted]d  3585 g M Vag-Spont   LIV  1 SAB            Past Surgical History:  Procedure Laterality Date   CYSTOSCOPY/URETEROSCOPY/HOLMIUM LASER/STENT PLACEMENT Right 08/02/2020   Procedure: CYSTOSCOPY/URETEROSCOPY/HOLMIUM LASER/STENT PLACEMENT;  Surgeon: Penne Knee, MD;  Location: [facility] ORS;  Service: Urology;  Laterality: Right;   DILATION AND CURETTAGE OF UTERUS N/A 08/12/2022   Procedure: DILATATION AND CURETTAGE, ULTRASOUND GUIDED;  Surgeon: Zina Jerilynn LABOR, MD;  Location: [facility] OR;  Service: Gynecology;  Laterality: N/A;   FOOT SURGERY Bilateral    metal screws in toes,2020   TONSILLECTOMY     Family History  Problem Relation Age of Onset   Hepatitis C Mother    Valvular heart disease Mother    Other Father        no contact in over 48yrs   Cancer Maternal Grandmother    Breast cancer Maternal Grandmother    Diabetes Other    Heart attack Other    Asthma Neg Hx    Heart disease Neg Hx    Hypertension Neg Hx    Social History   Tobacco Use   Smoking status: Never   Smokeless tobacco: Never  Vaping Use   Vaping status: Never Used  Substance Use Topics   Alcohol use: Not Currently   Drug use: Not Currently    Types: Marijuana    Comment: last used the end of September 2022 as of 02/07/2021   Allergies  Allergen Reactions   Abilify [Aripiprazole] Other (See Comments)    Seizures   Metronidazole  Hives and Other (See Comments)    Inguinal area   No medications prior to admission.    I have reviewed patient's Past Medical Hx, Surgical Hx, Family Hx, Social Hx, medications and allergies.   ROS  Pertinent items noted in HPI and remainder of comprehensive ROS otherwise negative.   PHYSICAL EXAM  Patient Vitals for the past 24 hrs:  BP Temp Temp src Pulse Resp SpO2 Height Weight  01/04/24 0926 112/63 98.4 F (36.9 C) Oral 99 18 100 % -- --  01/04/24 0920 -- -- -- -- -- -- 5' 5 (1.651 m) 91.4 kg    Constitutional: Well-developed, well-nourished female in  no acute distress.  HEENT: atraumatic, normocephalic. Neck has normal ROM. EOM intact. Cardiovascular: normal rate & rhythm, warm and well-perfused Respiratory: normal effort, no problems with respiration noted GI: Abd soft, non-distended. Normal bowel sounds. No tenderness of right side. Tenderness diffusely of left-sided abdomen with minimal palpation. Back: CVA tenderness on left side; no right-sided CVA tenderness. Tenderness diffusely of left-sided back. MSK: Extremities nontender, no edema, normal ROM Skin: warm and dry. Acyanotic, no jaundice or pallor. Neurologic: Alert and oriented x 4. No abnormal coordination. Psychiatric: Normal mood. Speech not slurred, not rapid/pressured. Patient is cooperative.   Labs: Results for orders placed or performed during the hospital encounter of 01/04/24 (from the past 24 hours)  CBC     Status: Abnormal   Collection Time: 01/04/24  9:38 AM  Result Value Ref Range   WBC 10.5 4.0 - 10.5 K/uL   RBC 3.53 (L) 3.87 - 5.11 MIL/uL   Hemoglobin 11.5 (L) 12.0 - 15.0 g/dL   HCT 64.9 (L) 63.9 - 53.9 %   MCV 99.2 80.0 - 100.0 fL   MCH 32.6 26.0 - 34.0 pg   MCHC 32.9 30.0 - 36.0 g/dL   RDW 86.7 88.4 - 84.4 %   Platelets 259 150 - 400 K/uL   nRBC 0.0 0.0 - 0.2 %  Comprehensive metabolic panel     Status: Abnormal   Collection Time: 01/04/24  9:38 AM  Result Value Ref Range   Sodium 139 135 - 145 mmol/L   Potassium 4.1 3.5 - 5.1 mmol/L   Chloride 107 98 - 111 mmol/L   CO2 21 (L) 22 - 32 mmol/L   Glucose, Bld 77 70 - 99 mg/dL   BUN 13 6 - 20 mg/dL   Creatinine, Ser 9.32 0.44 - 1.00 mg/dL   Calcium 8.7 (L) 8.9 - 10.3 mg/dL   Total Protein 6.2 (L) 6.5 - 8.1 g/dL   Albumin  2.7 (L) 3.5 - 5.0 g/dL   AST 15 15 - 41 U/L   ALT 14 0 - 44 U/L   Alkaline Phosphatase 69 38 - 126 U/L   Total Bilirubin 0.3 0.0 - 1.2 mg/dL   GFR, Estimated >39 >39 mL/min   Anion gap 11 5 - 15  Lipase, blood     Status: None   Collection Time: 01/04/24  9:38 AM  Result  Value Ref Range   Lipase 30 11 - 51 U/L  Urinalysis, Routine w reflex microscopic -Urine, Clean Catch     Status: Abnormal   Collection Time:  01/04/24 10:22 AM  Result Value Ref Range   Color, Urine YELLOW YELLOW   APPearance HAZY (A) CLEAR   Specific Gravity, Urine 1.016 1.005 - 1.030   pH 5.0 5.0 - 8.0   Glucose, UA NEGATIVE NEGATIVE mg/dL   Hgb urine dipstick SMALL (A) NEGATIVE   Bilirubin Urine NEGATIVE NEGATIVE   Ketones, ur NEGATIVE NEGATIVE mg/dL   Protein, ur NEGATIVE NEGATIVE mg/dL   Nitrite NEGATIVE NEGATIVE   Leukocytes,Ua MODERATE (A) NEGATIVE   RBC / HPF 0-5 0 - 5 RBC/hpf   WBC, UA 0-5 0 - 5 WBC/hpf   Bacteria, UA RARE (A) NONE SEEN   Squamous Epithelial / HPF 6-10 0 - 5 /HPF   Mucus PRESENT     Imaging:  US  RENAL Result Date: 01/04/2024 CLINICAL DATA:  Left-sided abdominal pain. EXAM: RENAL / URINARY TRACT ULTRASOUND COMPLETE COMPARISON:  None Available. FINDINGS: Right Kidney: Renal measurements: 8.5 x 5.5 x 3.6 cm = volume: 87 mL. 4 mm echogenic focus noted in upper pole most consistent with nonobstructive calculus. Echogenicity within normal limits. No mass or hydronephrosis visualized. Left Kidney: Renal measurements: 11.0 x 5.7 x 4.8 cm = volume: 155 mL. Probable 4 mm echogenicity seen in lower pole most consistent with nonobstructive calculus. Echogenicity within normal limits. No mass or hydronephrosis visualized. Bladder: Appears normal for degree of bladder distention. Other: None. IMPRESSION: Probable bilateral nonobstructive nephrolithiasis. No hydronephrosis or renal obstruction is noted. Electronically Signed   By: Lynwood Landy Raddle M.D.   On: 01/04/2024 11:49     MDM & MAU COURSE  MDM: High  MAU Course: Differential diagnosis considered for abdominal pain includes but is not limited to: round ligament pain, labor, UTI, pyelonephritis, PID, cervicitis, placental abruption, chorioamnionitis, nephrolithiasis.  Hx of nephrolithiasis in prior pregnancies,  current symptoms and physical exam suspicious for same. Kidney US  with nonobstructive nephrolithiasis. CBC without elevated white count, with stable Hgb. CMP without concerning findings, lipase normal. Given IV fluid bolus with LR 1 L; pt nauseous and unsure if able to tolerate po fluids/medications. Given IV Zofran  4 mg for nausea/vomiting. Given IV pain medication with IV dilaudid  1 mg with improvement.   Orders Placed This Encounter  Procedures   US  RENAL   Urinalysis, Routine w reflex microscopic -Urine, Clean Catch   CBC   Comprehensive metabolic panel   Lipase, blood   Discharge patient   Meds ordered this encounter  Medications   HYDROmorphone  (DILAUDID ) injection 1 mg   lactated ringers  bolus 1,000 mL   HYDROcodone -acetaminophen  (NORCO/VICODIN) 5-325 MG tablet    Sig: Take 1-2 tablets by mouth every 6 (six) hours as needed for moderate pain (pain score 4-6) or severe pain (pain score 7-10).    Dispense:  10 tablet    Refill:  0   tamsulosin  (FLOMAX ) 0.4 MG CAPS capsule    Sig: Take 1 capsule (0.4 mg total) by mouth daily.    Dispense:  7 capsule    Refill:  0   ondansetron  (ZOFRAN ) injection 4 mg   ondansetron  (ZOFRAN ) 4 MG tablet    Sig: Take 1 tablet (4 mg total) by mouth every 8 (eight) hours as needed for nausea or vomiting.    Dispense:  20 tablet    Refill:  0    ASSESSMENT   1. Pregnancy with nephrolithiasis   2. Supervision of high risk pregnancy, antepartum   3. [redacted] weeks gestation of pregnancy     PLAN  Discharge home in stable condition with  return precautions.  Discharged with prn hydrocodone -acetaminophen , Flomax  for 5 days, prn Zofran .    Allergies as of 01/04/2024       Reactions   Abilify [aripiprazole] Other (See Comments)   Seizures   Metronidazole  Hives, Other (See Comments)   Inguinal area        Medication List     TAKE these medications    cyclobenzaprine  10 MG tablet Commonly known as: FLEXERIL  Take 0.5-1 tablets (5-10 mg  total) by mouth 3 (three) times daily as needed for muscle spasms.   Doxylamine -Pyridoxine  10-10 MG Tbec Commonly known as: Diclegis  Take 2 tablets by mouth at bedtime. If symptoms persist, add one tablet in the morning and one in the afternoon   famotidine  40 MG tablet Commonly known as: PEPCID  Take 1 tablet (40 mg total) by mouth daily.   HYDROcodone -acetaminophen  5-325 MG tablet Commonly known as: NORCO/VICODIN Take 1-2 tablets by mouth every 6 (six) hours as needed for moderate pain (pain score 4-6) or severe pain (pain score 7-10).   ondansetron  4 MG tablet Commonly known as: ZOFRAN  Take 1 tablet (4 mg total) by mouth every 8 (eight) hours as needed for nausea or vomiting.   promethazine  25 MG tablet Commonly known as: PHENERGAN  Take 1 tablet (25 mg total) by mouth every 6 (six) hours as needed for nausea or vomiting.   tamsulosin  0.4 MG Caps capsule Commonly known as: FLOMAX  Take 1 capsule (0.4 mg total) by mouth daily.   Vitafol  Gummies 3.33-0.333-34.8 MG Chew Chew 3 tablets by mouth daily.        Alan Flies, MD   Midwife Attestation:  I personally saw and evaluated the patient, performing the key elements of the service. I developed and verified the management plan that is described in the resident's/student's note, and I agree with the content with my edits above. VSS, HRR&R, Resp unlabored, Legs neg.    Olam Boards, CNM 12:56 PM

## 2024-01-06 ENCOUNTER — Encounter: Payer: Self-pay | Admitting: Advanced Practice Midwife

## 2024-01-06 ENCOUNTER — Inpatient Hospital Stay (HOSPITAL_COMMUNITY)
Admission: AD | Admit: 2024-01-06 | Discharge: 2024-01-06 | Disposition: A | Discharge status: Home / Self Care | Attending: Obstetrics and Gynecology | Admitting: Obstetrics and Gynecology

## 2024-01-06 ENCOUNTER — Encounter (HOSPITAL_COMMUNITY): Payer: Self-pay | Admitting: Obstetrics and Gynecology

## 2024-01-06 ENCOUNTER — Other Ambulatory Visit: Payer: Self-pay

## 2024-01-06 DIAGNOSIS — A6004 Herpesviral vulvovaginitis: Secondary | ICD-10-CM | POA: Diagnosis not present

## 2024-01-06 DIAGNOSIS — O26832 Pregnancy related renal disease, second trimester: Secondary | ICD-10-CM | POA: Diagnosis not present

## 2024-01-06 DIAGNOSIS — O98312 Other infections with a predominantly sexual mode of transmission complicating pregnancy, second trimester: Secondary | ICD-10-CM | POA: Insufficient documentation

## 2024-01-06 DIAGNOSIS — O23592 Infection of other part of genital tract in pregnancy, second trimester: Secondary | ICD-10-CM | POA: Diagnosis not present

## 2024-01-06 DIAGNOSIS — N200 Calculus of kidney: Secondary | ICD-10-CM

## 2024-01-06 DIAGNOSIS — Z3A22 22 weeks gestation of pregnancy: Secondary | ICD-10-CM | POA: Insufficient documentation

## 2024-01-06 DIAGNOSIS — O26892 Other specified pregnancy related conditions, second trimester: Secondary | ICD-10-CM | POA: Diagnosis present

## 2024-01-06 LAB — URINALYSIS, ROUTINE W REFLEX MICROSCOPIC
Bilirubin Urine: NEGATIVE
Glucose, UA: NEGATIVE mg/dL
Hgb urine dipstick: NEGATIVE
Ketones, ur: NEGATIVE mg/dL
Nitrite: NEGATIVE
Protein, ur: NEGATIVE mg/dL
Specific Gravity, Urine: 1.014 (ref 1.005–1.030)
pH: 8 (ref 5.0–8.0)

## 2024-01-06 MED ORDER — LIDOCAINE 5 % EX OINT
TOPICAL_OINTMENT | Freq: Every day | CUTANEOUS | Status: DC | PRN
  Filled 2024-01-06: qty 35.44

## 2024-01-06 MED ORDER — HYDROMORPHONE HCL 1 MG/ML IJ SOLN: 2.0000 mg | Freq: Once | INTRAMUSCULAR | Status: DC

## 2024-01-06 MED ORDER — ONDANSETRON 4 MG PO TBDP
8.0000 mg | ORAL_TABLET | Freq: Once | ORAL | Status: AC
  Administered 2024-01-06: 8 mg via ORAL
  Filled 2024-01-06: qty 2

## 2024-01-06 MED ORDER — HYDROCODONE-ACETAMINOPHEN 5-325 MG PO TABS: 1.0000 | ORAL_TABLET | Freq: Four times a day (QID) | ORAL | 0 refills | Status: DC | PRN

## 2024-01-06 MED ORDER — HYDROMORPHONE HCL 1 MG/ML IJ SOLN
2.0000 mg | Freq: Once | INTRAMUSCULAR | Status: AC
  Administered 2024-01-06: 2 mg via INTRAMUSCULAR
  Filled 2024-01-06: qty 2

## 2024-01-06 MED ORDER — VALACYCLOVIR HCL 1 G PO TABS: 1000.0000 mg | ORAL_TABLET | Freq: Every day | ORAL | 0 refills | Status: DC

## 2024-01-06 NOTE — MAU Note (Addendum)
 Alyssa Howell is a 23 y.o. at [redacted]w[redacted]d here in MAU reporting: Patient reports she was here Friday was sent a prescription to manage pain. She states the pharmacy said the providers number had expired and was unable to get the medication. She tried to manage at home with tylenol  and was pain has worsened. Patient unable to eat or sleep due to pain. Patient reports due to pain she has started having a HSV breakout on clitoris that is also very painful.   Onset of complaint: Thursday  Pain score: 10/10 There were no vitals filed for this visit.    Lab orders placed from triage:  ua
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
## 2024-01-07 LAB — CULTURE, OB URINE: Culture: NO GROWTH

## 2024-01-08 ENCOUNTER — Encounter: Payer: Self-pay | Admitting: Advanced Practice Midwife

## 2024-01-08 ENCOUNTER — Telehealth: Payer: Self-pay | Admitting: Certified Nurse Midwife

## 2024-01-08 ENCOUNTER — Other Ambulatory Visit: Payer: Self-pay | Admitting: Certified Nurse Midwife
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
## 2024-01-09 ENCOUNTER — Encounter (HOSPITAL_COMMUNITY): Payer: Self-pay | Admitting: Obstetrics & Gynecology

## 2024-01-09 ENCOUNTER — Inpatient Hospital Stay (HOSPITAL_COMMUNITY)
Admission: AD | Admit: 2024-01-09 | Discharge: 2024-01-09 | Disposition: A | Discharge status: Home / Self Care | Payer: Self-pay | Attending: Obstetrics & Gynecology | Admitting: Obstetrics & Gynecology

## 2024-01-09 ENCOUNTER — Telehealth: Payer: Self-pay

## 2024-01-09 DIAGNOSIS — Z3A23 23 weeks gestation of pregnancy: Secondary | ICD-10-CM

## 2024-01-09 DIAGNOSIS — O26892 Other specified pregnancy related conditions, second trimester: Secondary | ICD-10-CM | POA: Insufficient documentation

## 2024-01-09 DIAGNOSIS — R519 Headache, unspecified: Secondary | ICD-10-CM

## 2024-01-09 DIAGNOSIS — O26893 Other specified pregnancy related conditions, third trimester: Secondary | ICD-10-CM | POA: Diagnosis not present

## 2024-01-09 DIAGNOSIS — Z8619 Personal history of other infectious and parasitic diseases: Secondary | ICD-10-CM | POA: Diagnosis not present

## 2024-01-09 LAB — CBC
HCT: 33.3 % — ABNORMAL LOW (ref 36.0–46.0)
Hemoglobin: 10.8 g/dL — ABNORMAL LOW (ref 12.0–15.0)
MCH: 32.3 pg (ref 26.0–34.0)
MCHC: 32.4 g/dL (ref 30.0–36.0)
MCV: 99.7 fL (ref 80.0–100.0)
Platelets: 282 K/uL (ref 150–400)
RBC: 3.34 MIL/uL — ABNORMAL LOW (ref 3.87–5.11)
RDW: 12.7 % (ref 11.5–15.5)
WBC: 11.1 K/uL — ABNORMAL HIGH (ref 4.0–10.5)
nRBC: 0 % (ref 0.0–0.2)

## 2024-01-09 LAB — COMPREHENSIVE METABOLIC PANEL WITH GFR
ALT: 12 U/L (ref 0–44)
AST: 13 U/L — ABNORMAL LOW (ref 15–41)
Albumin: 2.6 g/dL — ABNORMAL LOW (ref 3.5–5.0)
Alkaline Phosphatase: 78 U/L (ref 38–126)
Anion gap: 11 (ref 5–15)
BUN: 9 mg/dL (ref 6–20)
CO2: 23 mmol/L (ref 22–32)
Calcium: 9.3 mg/dL (ref 8.9–10.3)
Chloride: 106 mmol/L (ref 98–111)
Creatinine, Ser: 0.7 mg/dL (ref 0.44–1.00)
GFR, Estimated: >60 mL/min (ref >60)
Glucose, Bld: 78 mg/dL (ref 70–99)
Potassium: 4 mmol/L (ref 3.5–5.1)
Sodium: 140 mmol/L (ref 135–145)
Total Bilirubin: 0.3 mg/dL (ref 0.0–1.2)
Total Protein: 6.7 g/dL (ref 6.5–8.1)

## 2024-01-09 LAB — URINALYSIS, ROUTINE W REFLEX MICROSCOPIC
Bilirubin Urine: NEGATIVE
Glucose, UA: NEGATIVE mg/dL
Hgb urine dipstick: NEGATIVE
Ketones, ur: NEGATIVE mg/dL
Nitrite: NEGATIVE
Protein, ur: NEGATIVE mg/dL
Specific Gravity, Urine: 1.013 (ref 1.005–1.030)
pH: 7 (ref 5.0–8.0)

## 2024-01-09 LAB — PROTEIN / CREATININE RATIO, URINE
Creatinine, Urine: 62 mg/dL
Total Protein, Urine: <6 mg/dL

## 2024-01-09 MED ORDER — ACETAMINOPHEN-CAFFEINE 500-65 MG PO TABS: 2.0000 | ORAL_TABLET | Freq: Four times a day (QID) | ORAL | 0 refills | Status: AC | PRN

## 2024-01-09 MED ORDER — METOCLOPRAMIDE HCL 10 MG PO TABS: 10.0000 mg | ORAL_TABLET | Freq: Four times a day (QID) | ORAL | 0 refills | Status: DC | PRN

## 2024-01-09 MED ORDER — METOCLOPRAMIDE HCL 5 MG/ML IJ SOLN
10.0000 mg | Freq: Once | INTRAMUSCULAR | Status: AC
  Administered 2024-01-09: 10 mg via INTRAVENOUS
  Filled 2024-01-09: qty 2

## 2024-01-09 MED ORDER — FERROUS SULFATE 325 (65 FE) MG PO TABS: 325.0000 mg | ORAL_TABLET | Freq: Every day | ORAL | 0 refills | Status: AC

## 2024-01-09 MED ORDER — ACETAMINOPHEN-CAFFEINE 500-65 MG PO TABS
2.0000 | ORAL_TABLET | Freq: Once | ORAL | Status: AC
  Administered 2024-01-09: 2 via ORAL
  Filled 2024-01-09: qty 2

## 2024-01-09 MED ORDER — LACTATED RINGERS IV BOLUS
1000.0000 mL | Freq: Once | INTRAVENOUS | Status: AC
  Administered 2024-01-09: 1000 mL via INTRAVENOUS

## 2024-01-09 MED ORDER — DIPHENHYDRAMINE HCL 50 MG/ML IJ SOLN
25.0000 mg | Freq: Once | INTRAMUSCULAR | Status: AC
  Administered 2024-01-09: 25 mg via INTRAVENOUS
  Filled 2024-01-09: qty 1

## 2024-01-09 NOTE — MAU Note (Signed)
 [name] is a 23 y.o. at [redacted]w[redacted]d here in MAU reporting: has had a really  bad HA the past 3 days,  has taken 1 - Hydrocodone  and some Tylenol , neither have helped.  BP has been shooting up really high, 175/89 before she came in.  Feeling dizzy today. Having blurring and seeing spots, keeps having pain in RUQ and rt shoulder (started yesterday), reports increased swelling in her face.  Reports fever of 101 yesterday. Denies bleeding or LOF.  Reports +FM.  Onset of complaint: 3 days ago Pain score: HA 10, vag 10 Vitals:   01/09/24 1304  BP: 116/70  Pulse: 99  Resp: 17  Temp: 99.2 F (37.3 C)  SpO2: 98%     FHT:150 Lab orders placed from triage:  urine
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
## 2024-01-09 NOTE — Telephone Encounter (Signed)
 Called patient and informed her to go to MAU for further evaluation. She verbalized understanding and said she would go.

## 2024-01-09 NOTE — Telephone Encounter (Signed)
 Patient called the after hours triage line yesterday. I called to check on patient to see how she was doing and to see if she went to ED as advised. She reports that she did not go to ED for evaluation. She continues to have persistent headaches with the following symptoms: Upper epigastric pain, visual disturbances, nausea, facial swelling and vaginal pressure. She denies having history of preeclampsia with previous pregnancies. Reports that she had her last baby was preterm at [redacted]w[redacted]d. Please advise.

## 2024-01-10 ENCOUNTER — Ambulatory Visit: Attending: Advanced Practice Midwife

## 2024-01-10 ENCOUNTER — Encounter: Admitting: Obstetrics

## 2024-01-10 ENCOUNTER — Other Ambulatory Visit: Payer: Self-pay | Admitting: *Deleted

## 2024-01-10 ENCOUNTER — Ambulatory Visit (HOSPITAL_BASED_OUTPATIENT_CLINIC_OR_DEPARTMENT_OTHER): Admitting: Obstetrics and Gynecology

## 2024-01-10 VITALS — BP 108/86 | HR 98

## 2024-01-10 DIAGNOSIS — O99210 Obesity complicating pregnancy, unspecified trimester: Secondary | ICD-10-CM | POA: Diagnosis not present

## 2024-01-10 DIAGNOSIS — O09899 Supervision of other high risk pregnancies, unspecified trimester: Secondary | ICD-10-CM

## 2024-01-10 DIAGNOSIS — W19XXXD Unspecified fall, subsequent encounter: Secondary | ICD-10-CM

## 2024-01-10 DIAGNOSIS — O09212 Supervision of pregnancy with history of pre-term labor, second trimester: Secondary | ICD-10-CM | POA: Diagnosis not present

## 2024-01-10 DIAGNOSIS — O99212 Obesity complicating pregnancy, second trimester: Secondary | ICD-10-CM | POA: Diagnosis not present

## 2024-01-10 DIAGNOSIS — O09292 Supervision of pregnancy with other poor reproductive or obstetric history, second trimester: Secondary | ICD-10-CM

## 2024-01-10 DIAGNOSIS — Z3A23 23 weeks gestation of pregnancy: Secondary | ICD-10-CM | POA: Insufficient documentation

## 2024-01-10 DIAGNOSIS — O0990 Supervision of high risk pregnancy, unspecified, unspecified trimester: Secondary | ICD-10-CM

## 2024-01-10 DIAGNOSIS — Z8279 Family history of other congenital malformations, deformations and chromosomal abnormalities: Secondary | ICD-10-CM

## 2024-01-10 DIAGNOSIS — O9A212 Injury, poisoning and certain other consequences of external causes complicating pregnancy, second trimester: Secondary | ICD-10-CM | POA: Diagnosis not present

## 2024-01-10 DIAGNOSIS — Z362 Encounter for other antenatal screening follow-up: Secondary | ICD-10-CM

## 2024-01-10 DIAGNOSIS — F1290 Cannabis use, unspecified, uncomplicated: Secondary | ICD-10-CM | POA: Insufficient documentation

## 2024-01-10 DIAGNOSIS — E669 Obesity, unspecified: Secondary | ICD-10-CM

## 2024-01-10 NOTE — Progress Notes (Addendum)
 Maternal-Fetal Medicine Consultation  Name: Alyssa Howell  MRN: [id number]  GA: [id number] [redacted]w[redacted]d   Patient is here for fetal anatomy scan.  On cell-free fetal DNA screening, the risks of fetal aneuploidies are not increased.  MSAFP screening showed low risk for open neural tube defects. -History of preterm delivery.  Obstetrical history significant for a term vaginal delivery in 2022 followed by a spontaneous preterm delivery in April 2024 at 34w 6d gestation.   - Her first child had double aortic arch but no surgery was performed. Both her children are in good health. -Short interval between pregnancies. -History of fall last month and the patient was evaluated at the MAU.  No history of vaginal bleeding. Ultrasound We performed fetal anatomy scan. No makers of aneuploidies or fetal structural defects are seen.  Fetal cardiac anatomy appears normal.  Fetal biometry is consistent with her previously-established dates. Amniotic fluid is normal and good fetal activity is seen. Patient understands the limitations of ultrasound in detecting fetal anomalies.  On transabdominal scan, the cervix looks long and closed.  Patient does not have symptoms of preterm labor. As maternal obesity imposes limitations on the resolution of images, fetal anomalies may be missed.  History of preterm delivery I counseled the patient that history of preterm delivery is one of the most important causes of recurrent preterm delivery.  I reassured the patient her normal cervical length measurement.  Previously, we used to prescribe intramuscular progesterone prophylaxis and after FDA disapproval, it is not prescribed anymore.  Short Interpregnancy Interval It is defined as the time interval (18 to 24 months) between the end of previous pregnancy and the beginning of next pregnancy. The impact of short pregnancy interval on the outcome of subsequent pregnancy is uncertain. Some studies have shown that congenital anomalies,  preterm delivery, and fetal growth restriction rates are increased in pregnancies with short interpregnancy interval.  However, it is not supported by other reports. Overall, we should expect good pregnancy outcomes if there are no other high-risk factors.  History of congenital heart defect in the sibling I counseled the patient that the likelihood of congenital heart defect in his sibling is about 3% (as against background rate of 1% or less).  I reassured the patient of normal cardiac anatomy on today's ultrasound.  I recommended fetal echocardiography.  Recommendations -An appointment was made for her to return in 9 weeks for fetal growth assessment. -We have requested an appointment for fetal echocardiography [facility] [place]).    Consultation including face-to-face (more than 50%) counseling 30 minutes.

## 2024-01-15 ENCOUNTER — Encounter: Payer: Self-pay | Admitting: Advanced Practice Midwife

## 2024-01-15 ENCOUNTER — Ambulatory Visit: Admitting: Advanced Practice Midwife

## 2024-01-15 VITALS — BP 122/70 | HR 111 | Wt 203.0 lb

## 2024-01-15 DIAGNOSIS — N200 Calculus of kidney: Secondary | ICD-10-CM | POA: Diagnosis not present

## 2024-01-15 DIAGNOSIS — O0990 Supervision of high risk pregnancy, unspecified, unspecified trimester: Secondary | ICD-10-CM

## 2024-01-15 DIAGNOSIS — A6004 Herpesviral vulvovaginitis: Secondary | ICD-10-CM

## 2024-01-15 DIAGNOSIS — Z8279 Family history of other congenital malformations, deformations and chromosomal abnormalities: Secondary | ICD-10-CM

## 2024-01-15 DIAGNOSIS — O09899 Supervision of other high risk pregnancies, unspecified trimester: Secondary | ICD-10-CM

## 2024-01-15 MED ORDER — VALACYCLOVIR HCL 500 MG PO TABS: 500.0000 mg | ORAL_TABLET | Freq: Two times a day (BID) | ORAL | 4 refills | Status: DC

## 2024-01-15 NOTE — Progress Notes (Signed)
 Pt presents for ROB visit. Requesting Rx valtrex  due to recent outbreak. She has question about taking Valtrex  daily now due to possible PTD.

## 2024-01-15 NOTE — Progress Notes (Signed)
   PRENATAL VISIT NOTE  Subjective:  Alyssa Howell is a 23 y.o. H4E8877 at [redacted]w[redacted]d being seen today for ongoing prenatal care.  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  Patient reports intermittent lower abdominal cramping.  Contractions: Not present. Vag. Bleeding: None.  Movement: Present. Denies leaking of fluid.   The following portions of the patient's history were reviewed and updated as appropriate: allergies, current medications, past family history, past medical history, past social history, past surgical history and problem list.   Objective:    Vitals:   01/15/24 1034  BP: 122/70  Pulse: (!) 111  Weight: 203 lb (92.1 kg)    Fetal Status:  Fetal Heart Rate (bpm): 152   Movement: Present    General: Alert, oriented and cooperative. Patient is in no acute distress.  Skin: Skin is warm and dry. No rash noted.   Cardiovascular: Normal heart rate noted  Respiratory: Normal respiratory effort, no problems with respiration noted  Abdomen: Soft, gravid, appropriate for gestational age.  Pain/Pressure: Present     Pelvic: Cervical exam deferred        Extremities: Normal range of motion.  Edema: None  Mental Status: Normal mood and affect. Normal behavior. Normal judgment and thought content.   Assessment and Plan:  Pregnancy: H4E8877 at [redacted]w[redacted]d 1. Herpes simplex vulvovaginitis (Primary) --Recent outbreak, hx of 2 prior outbreaks, concerned about PTD and wants to start prophylactic Valtrex .   - valACYclovir  (VALTREX ) 500 MG tablet; Take 1 tablet (500 mg total) by mouth 2 (two) times daily.   Dispense: 60 tablet; Refill: 4  2. Supervision of high risk pregnancy, antepartum --Anticipatory guidance about next visits/weeks of pregnancy given.   3. Family history of congenital heart defect-prev child double aortic valve --Fetal echo ordered  4. History of preterm delivery, currently pregnant --Discussed risks, reviewed s/sx of PTL --Cervical length 4.8 cm on 01/10/24  5. Recurrent nephrolithiasis    Preterm labor symptoms and general obstetric precautions including but not limited to vaginal bleeding, contractions, leaking of fluid and fetal movement were reviewed in detail with the patient. Please refer to After Visit Summary for other counseling recommendations.   Return in about 4 weeks (around 02/12/2024) for LOB, GTT at next visit.  Future Appointments  Date Time Provider Department Center  02/12/2024  8:15 AM [facility] None  02/12/2024 10:15 AM Nicholaus Burnard HERO, MD [facility] None  03/13/2024 11:15 AM [facility] PROVIDER 1 [facility]  03/13/2024 11:30 AM [facility] US2 [facility]    Olam Boards, CNM

## 2024-01-17 ENCOUNTER — Other Ambulatory Visit: Payer: Self-pay

## 2024-01-17 DIAGNOSIS — B379 Candidiasis, unspecified: Secondary | ICD-10-CM

## 2024-01-17 MED ORDER — TERCONAZOLE 0.4 % VA CREA: 1.0000 | TOPICAL_CREAM | Freq: Every day | VAGINAL | 0 refills | Status: AC

## 2024-01-18 ENCOUNTER — Ambulatory Visit: Admitting: Obstetrics and Gynecology

## 2024-01-31 ENCOUNTER — Encounter: Payer: Self-pay | Admitting: Student

## 2024-01-31 ENCOUNTER — Ambulatory Visit: Admitting: Student

## 2024-01-31 VITALS — BP 114/73 | HR 123 | Temp 98.8°F | Ht 65.0 in | Wt 210.0 lb

## 2024-01-31 DIAGNOSIS — H9209 Otalgia, unspecified ear: Secondary | ICD-10-CM

## 2024-01-31 DIAGNOSIS — J029 Acute pharyngitis, unspecified: Secondary | ICD-10-CM

## 2024-01-31 LAB — POC SOFIA 2 FLU + SARS ANTIGEN FIA
Influenza A, POC: NEGATIVE
Influenza B, POC: NEGATIVE
SARS Coronavirus 2 Ag: NEGATIVE

## 2024-01-31 LAB — POCT RAPID STREP A (OFFICE): Rapid Strep A Screen: NEGATIVE

## 2024-01-31 NOTE — Progress Notes (Signed)
    SUBJECTIVE:   CHIEF COMPLAINT / HPI:   Sore throat and otalgia - Symptoms present for 4 days - Fevers at home - Taking OTC cold medicine, avoiding NSAIDs - Also using Chloraseptic spray for sore throat - Reports history of strep throat infections despite adenoidectomy (most recently in June) - No associated cough, NVD, no shortness of breath, no chest pain  OBJECTIVE:   BP 114/73   Pulse (!) 123   Temp 98.8 F (37.1 C)   Ht 5' 5 (1.651 m)   Wt 210 lb (95.3 kg)   LMP 06/21/2023 (Approximate)   SpO2 96%   BMI 34.95 kg/m    General: NAD, pleasant HEENT: Normocephalic, atraumatic head. Normal external ear, canal, TM bilaterally. EOM intact and normal conjunctiva BL. Normal external nose. Throat erythematous, no exudate, no deviation. Normal dentition.  Cardio: RRR, no MRG. Respiratory: CTAB, normal wob on RA Skin: Warm and dry  ASSESSMENT/PLAN:   Assessment & Plan Otalgia, unspecified laterality Sore throat - COVID/flu negative - Strep A negative - Strongly suspect viral etiology - Suspect tachycardia likely in setting of acute illness + use of OTC decongestants.  If still tachycardic despite improvement of acute illness, would recommend checking thyroid and EKG-consider other workup.  She has upcoming appointment with OB/GYN on 02/12/2024 - Follow-up and return precautions discussed    Gladis Church, DO [facility]

## 2024-01-31 NOTE — Patient Instructions (Addendum)
 Upper respiratory tract infections cause symptoms such as nasal congestion, runny nose, sore throat, cough, and general malaise. Typically these are viral.  Here are some evidence-based recommendations for managing the common cold at home:  Over-the-Counter Medications 1. Pain Relievers: Acetaminophen  (Tylenol ) or nonsteroidal anti-inflammatory drugs (NSAIDs) like ibuprofen  (Advil ) can help reduce fever, sore throat, and body aches. 2. Decongestants: Pseudoephedrine  (Sudafed) and phenylephrine  can help relieve nasal congestion-but I recommend avoiding these during pregnancy. 3. Antihistamines: Like Zyrtec , can modestly improve sypmptpoms 4. Cough Suppressants: Dextromethorphan may help reduce cough in adults and can be used cautiously in pregnancy, but please note that this can cause tachycardia.   Non-Medication Remedies 1. Hydration: Drink plenty of fluids like water, herbal teas, and broths to stay hydrated and help thin mucus. 2. Rest: Ensure adequate rest to help your body fight off the infection. 3. Humidified Air: Using a humidifier or taking steamy showers can help relieve nasal congestion and soothe irritated airways. 4. Nasal Saline Irrigation: Rinsing the nasal passages with saline solution can help clear mucus and relieve congestion. 5. Honey: For children over one year old (do not use in children under one year old), honey can help soothe a sore throat and reduce coughing. 6. Vapor Rub: Applying a mentholated chest rub can help relieve cough and congestion, especially in children.  Prevention Tips 1. Hand Hygiene: Regular hand washing with soap and water can help prevent the spread of cold viruses. 2. Avoid Close Contact: Stay away from individuals who are sick to reduce the risk of catching a cold.  When to See a Doctor  If symptoms persist for more than 10 days.  If you experience high fever, shortness of breath, or severe headache.  If you have underlying health conditions that  may complicate a cold.  Remember, antibiotics are not effective against viruses. Always consult with a healthcare provider before starting any new medication, especially for children.

## 2024-02-03 ENCOUNTER — Encounter: Payer: Self-pay | Admitting: Obstetrics and Gynecology

## 2024-02-04 ENCOUNTER — Ambulatory Visit (INDEPENDENT_AMBULATORY_CARE_PROVIDER_SITE_OTHER): Admitting: Family Medicine

## 2024-02-04 ENCOUNTER — Ambulatory Visit: Payer: Self-pay | Admitting: Family Medicine

## 2024-02-04 VITALS — BP 123/76 | HR 94 | Temp 98.3°F | Ht 65.0 in | Wt 212.4 lb

## 2024-02-04 DIAGNOSIS — J029 Acute pharyngitis, unspecified: Secondary | ICD-10-CM | POA: Diagnosis not present

## 2024-02-04 DIAGNOSIS — Z23 Encounter for immunization: Secondary | ICD-10-CM

## 2024-02-04 LAB — POCT RAPID STREP A (OFFICE): Rapid Strep A Screen: NEGATIVE

## 2024-02-04 MED ORDER — LIDOCAINE VISCOUS HCL 2 % MT SOLN
5.0000 mL | Freq: Three times a day (TID) | OROMUCOSAL | 0 refills | Status: DC
Start: 2024-02-04 — End: 2024-03-10

## 2024-02-04 NOTE — Patient Instructions (Addendum)
 It was great to see you today! Thank you for choosing [facility] for your primary care.  Today we addressed:  Sore throat Please use use the Magic Mouthwash 3 times daily, make sure to swish and spit, not swallow. You can keep using the chloraseptic spray and cough drops.  You should be getting better by the end of this week.  You should return to our clinic if not getting better by Friday.  Thank you for coming to see us  at [facility] and for the opportunity to care for you! Toma, Alyssa Monette, MD 02/04/2024, 10:51 AM

## 2024-02-04 NOTE — Progress Notes (Addendum)
   SUBJECTIVE:   CHIEF COMPLAINT / HPI:  Alyssa Howell is a 23 y.o. female [phone] at [redacted]w[redacted]d with a pertinent past medical history of preterm delivery and herpes simplex vulvovaginitis presenting to the clinic for sore throat.  Denies LOF, vaginal bleeding, contractions, and decreased fetal movement.  Sore throat Patient was seen on 10/9 (Friday) for sore throat and otalgia.  Chloraseptic spray transiently helps. Using cough drops, stopped cough medicine since last visit. Patient has not had any fevers. Patient has a history of recurrent strep pharyngitis. Believes she had her adenoids or tonsils removed when she was younger, she does not remember which one.   PERTINENT PMH / PSH: Prior history of preterm delivery at 34 weeks in prior pregnancy Herpes simplex vulvovaginitis  *Remainder reviewed in problem list.   OBJECTIVE:   BP 123/76   Pulse 94   Temp 98.3 F (36.8 C)   Ht 5' 5 (1.651 m)   Wt 212 lb 6.4 oz (96.3 kg)   LMP 06/21/2023 (Approximate)   SpO2 98%   BMI 35.35 kg/m   General: Age-appropriate, resting comfortably in chair, NAD, alert and at baseline. HEENT:  Head: Normocephalic, atraumatic. No tenderness to percussion over sinuses. Eyes: PERRLA. No conjunctival erythema or scleral injections. Ears: TMs non-bulging and non-erythematous bilaterally. No erythema of external ear canal. No cerumen impaction. Nose: Non-erythematous turbinates. No rhinorrhea. Mouth/Oral: Notable posterior oropharyngeal erythema.  Unable to visualize tonsils, no exudate noted. MMM.  No oral lesions or rashes. Neck: Supple. No LAD. Cardiovascular: Regular rate and rhythm. Normal S1/S2. No murmurs, rubs, or gallops appreciated. 2+ radial pulses. Pulmonary: Clear bilaterally to ascultation. No wheezes, crackles, or rhonchi. Normal WOB on room air. No accessory muscle use. Abdominal: Gravid uterus above umbilicus.  No TTP. Skin: Warm and dry.  No rashes grossly. Extremities: Capillary  refill <2 seconds.   ASSESSMENT/PLAN:   Assessment & Plan Sore throat Fever viral pharyngitis, now with rapid strep negative twice.  No recent oral sexual intercourse to suggest chlamydia or gonorrhea pharyngitis.  Given duration of symptoms, would expect improvement by the end of this week.  Reassuringly, patient remains afebrile.  Overall, well-hydrated and well-appearing. - Repeat rapid strep negative - Strep throat culture ordered but not obtained prior to patient leaving clinic due to error - Magic mouthwash swish and spit TID, precaution not to swallow - Continue to avoid NSAIDs - Continue Chloraseptic throat spray and cough drops - Return if not improving by Friday, would recommend collecting throat culture at that time Encounter for immunization Influenza vaccine administered today.  Alyssa Broxton Toma, MD [facility]

## 2024-02-09 ENCOUNTER — Encounter (HOSPITAL_COMMUNITY): Payer: Self-pay | Admitting: Obstetrics and Gynecology

## 2024-02-09 ENCOUNTER — Inpatient Hospital Stay (HOSPITAL_COMMUNITY)
Admission: AD | Admit: 2024-02-09 | Discharge: 2024-02-10 | Disposition: A | Attending: Obstetrics and Gynecology | Admitting: Obstetrics and Gynecology

## 2024-02-09 DIAGNOSIS — O26832 Pregnancy related renal disease, second trimester: Secondary | ICD-10-CM | POA: Diagnosis not present

## 2024-02-09 DIAGNOSIS — N200 Calculus of kidney: Secondary | ICD-10-CM | POA: Diagnosis not present

## 2024-02-09 DIAGNOSIS — M549 Dorsalgia, unspecified: Secondary | ICD-10-CM

## 2024-02-09 DIAGNOSIS — O26892 Other specified pregnancy related conditions, second trimester: Secondary | ICD-10-CM | POA: Diagnosis not present

## 2024-02-09 DIAGNOSIS — Z3A27 27 weeks gestation of pregnancy: Secondary | ICD-10-CM | POA: Insufficient documentation

## 2024-02-09 LAB — URINALYSIS, ROUTINE W REFLEX MICROSCOPIC
Bilirubin Urine: NEGATIVE
Glucose, UA: NEGATIVE mg/dL
Hgb urine dipstick: NEGATIVE
Ketones, ur: NEGATIVE mg/dL
Nitrite: NEGATIVE
Protein, ur: NEGATIVE mg/dL
Specific Gravity, Urine: 1.021 (ref 1.005–1.030)
pH: 6 (ref 5.0–8.0)

## 2024-02-09 NOTE — MAU Note (Signed)
 MAU Triage Note: Alyssa Howell is a 23 y.o. at [redacted]w[redacted]d here in MAU reporting: reports sharp/stabbing mid to lower back pain that started three days ago - this pain is similar to pain she has had with kidney stones previously. She also reports urgency to void, but only able to void small amounts. Denies VB or abnormal discharge. Reports +FM.  Patient complaint: kidney pain  Pain Score: 10-Worst pain ever Pain Location: Back     Onset of complaint: past three days LMP: Patient's last menstrual period was 06/21/2023 (approximate).  Vitals:   02/09/24 2258  BP: 129/74  Pulse: (!) 121  Resp: 18  Temp: 98.6 F (37 C)  SpO2: 98%    FHT:  Fetal Heart Rate Mode: Doppler Baseline Rate (A): 142 bpm Multiple birth?: No Lab orders placed from triage: UA

## 2024-02-10 ENCOUNTER — Inpatient Hospital Stay (HOSPITAL_COMMUNITY)

## 2024-02-10 ENCOUNTER — Encounter (HOSPITAL_COMMUNITY): Payer: Self-pay | Admitting: Obstetrics and Gynecology

## 2024-02-10 DIAGNOSIS — N200 Calculus of kidney: Secondary | ICD-10-CM | POA: Diagnosis not present

## 2024-02-10 DIAGNOSIS — Z3A27 27 weeks gestation of pregnancy: Secondary | ICD-10-CM

## 2024-02-10 DIAGNOSIS — O26892 Other specified pregnancy related conditions, second trimester: Secondary | ICD-10-CM

## 2024-02-10 LAB — COMPREHENSIVE METABOLIC PANEL WITH GFR
ALT: 16 U/L (ref 0–44)
AST: 18 U/L (ref 15–41)
Albumin: 2.5 g/dL — ABNORMAL LOW (ref 3.5–5.0)
Alkaline Phosphatase: 103 U/L (ref 38–126)
Anion gap: 10 (ref 5–15)
BUN: 13 mg/dL (ref 6–20)
CO2: 21 mmol/L — ABNORMAL LOW (ref 22–32)
Calcium: 8.3 mg/dL — ABNORMAL LOW (ref 8.9–10.3)
Chloride: 105 mmol/L (ref 98–111)
Creatinine, Ser: 0.62 mg/dL (ref 0.44–1.00)
GFR, Estimated: >60 mL/min (ref >60)
Glucose, Bld: 90 mg/dL (ref 70–99)
Potassium: 3.7 mmol/L (ref 3.5–5.1)
Sodium: 136 mmol/L (ref 135–145)
Total Bilirubin: 0.2 mg/dL (ref 0.0–1.2)
Total Protein: 6.3 g/dL — ABNORMAL LOW (ref 6.5–8.1)

## 2024-02-10 LAB — CBC
HCT: 30.8 % — ABNORMAL LOW (ref 36.0–46.0)
Hemoglobin: 9.9 g/dL — ABNORMAL LOW (ref 12.0–15.0)
MCH: 31.3 pg (ref 26.0–34.0)
MCHC: 32.1 g/dL (ref 30.0–36.0)
MCV: 97.5 fL (ref 80.0–100.0)
Platelets: 331 K/uL (ref 150–400)
RBC: 3.16 MIL/uL — ABNORMAL LOW (ref 3.87–5.11)
RDW: 12.4 % (ref 11.5–15.5)
WBC: 13 K/uL — ABNORMAL HIGH (ref 4.0–10.5)
nRBC: 0 % (ref 0.0–0.2)

## 2024-02-10 MED ORDER — LACTATED RINGERS IV BOLUS
1000.0000 mL | Freq: Once | INTRAVENOUS | Status: AC
  Administered 2024-02-10: 1000 mL via INTRAVENOUS

## 2024-02-10 MED ORDER — TAMSULOSIN HCL 0.4 MG PO CAPS
0.4000 mg | ORAL_CAPSULE | Freq: Every day | ORAL | 2 refills | Status: DC
Start: 2024-02-10 — End: 2024-03-13

## 2024-02-10 MED ORDER — OXYCODONE HCL 5 MG PO CAPS: 5.0000 mg | ORAL_CAPSULE | ORAL | 0 refills | Status: AC | PRN

## 2024-02-10 MED ORDER — HYDROMORPHONE HCL 1 MG/ML IJ SOLN
1.0000 mg | Freq: Once | INTRAMUSCULAR | Status: AC
  Administered 2024-02-10: 1 mg via INTRAVENOUS
  Filled 2024-02-10: qty 1

## 2024-02-10 NOTE — Discharge Instructions (Signed)
Drink at least 1/2 to 1 gallon of water everyday

## 2024-02-10 NOTE — MAU Provider Note (Signed)
 History     CSN: [id number]  Arrival date and time: 02/09/24 2240   Event Date/Time   First Provider Initiated Contact with Patient 02/10/24 0028      Chief Complaint  Patient presents with   Back Pain   HPI Alyssa Howell is a 23 y.o. year old [id number] female at [redacted]w[redacted]d weeks gestation who presents to MAU reporting sharp, stabbing mid to lower back pain x 3 days. She describes this pain as being similar to kidney stone pain she has had in the past; rated 10/10. She took Tylenol  975 mg at about 1930 without any relief. She states she has not been able to sleep for 2 days. She reports urgency, but voids a small amount. She denies VB or vaginal d/c. She reports (+) FM. She receives [facility] with [facility]; next appt is 02/12/2024.    OB History     Gravida  5   Para  2   Term  1   Preterm  1   AB  2   Living  2      SAB  1   IAB  1   Ectopic  0   Multiple  0   Live Births  2           Past Medical History:  Diagnosis Date   ADHD    Anemia    Chlamydia 2022   Depression    GERD (gastroesophageal reflux disease) 10/09/2019   HSV-2 infection    per pt/ seen in ED [facility]   Kidney stones    Seizures (HCC)    x 1 in 5th grade after taking ability. No other episodes.   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   Vaginal pain at introitus  Referred to Pelvic Floor PT      Past Surgical History:  Procedure Laterality Date   CYSTOSCOPY/URETEROSCOPY/HOLMIUM LASER/STENT PLACEMENT Right 08/02/2020   Procedure: CYSTOSCOPY/URETEROSCOPY/HOLMIUM LASER/STENT PLACEMENT;  Surgeon: Penne Knee, MD;  Location: [facility] ORS;  Service: Urology;  Laterality: Right;   DILATION AND CURETTAGE OF UTERUS N/A 08/12/2022   Procedure: DILATATION AND CURETTAGE, ULTRASOUND GUIDED;  Surgeon: Zina Jerilynn LABOR, MD;  Location: [facility] OR;  Service: Gynecology;  Laterality: N/A;   FOOT SURGERY Bilateral    metal screws in toes,2020   TONSILLECTOMY      Family  History  Problem Relation Age of Onset   Hepatitis C Mother    Valvular heart disease Mother    Other Father        no contact in over 44yrs   Cancer Maternal Grandmother    Breast cancer Maternal Grandmother    Diabetes Other    Heart attack Other    Asthma Neg Hx    Heart disease Neg Hx    Hypertension Neg Hx     Social History   Tobacco Use   Smoking status: Never   Smokeless tobacco: Never  Vaping Use   Vaping status: Never Used  Substance Use Topics   Alcohol use: Not Currently   Drug use: Not Currently    Types: Marijuana    Comment: last used the end of September 2022 as of 02/07/2021    Allergies:  Allergies  Allergen Reactions   Abilify [Aripiprazole] Other (See Comments)    Seizures   Metronidazole  Hives and Other (See Comments)    Inguinal area    Medications Prior to Admission  Medication Sig Dispense Refill Last Dose/Taking   acetaminophen  (TYLENOL ) 325 MG  tablet Take 650 mg by mouth every 4 (four) hours as needed for mild pain (pain score 1-3) or moderate pain (pain score 4-6).   02/09/2024   acetaminophen -caffeine  (EXCEDRIN TENSION HEADACHE) 500-65 MG TABS per tablet Take 2 tablets by mouth 4 (four) times daily as needed (For headaches). 60 tablet 0 Past Month   ferrous sulfate  325 (65 FE) MG tablet Take 1 tablet (325 mg total) by mouth daily. 30 tablet 0 Past Month   magic mouthwash (lidocaine , diphenhydrAMINE , alum & mag hydroxide) suspension Swish and spit 5 mLs 3 (three) times daily. 360 mL 0 Past Week   metoCLOPramide  (REGLAN ) 10 MG tablet Take 1 tablet (10 mg total) by mouth every 6 (six) hours as needed (Headcahes). 30 tablet 0 Past Month   Prenatal Vit-Fe Phos-FA-Omega (VITAFOL  GUMMIES) 3.33-0.333-34.8 MG CHEW Chew 3 tablets by mouth daily. 90 tablet 11 02/09/2024   valACYclovir  (VALTREX ) 500 MG tablet Take 1 tablet (500 mg total) by mouth 2 (two) times daily. 60 tablet 4 02/09/2024   cyclobenzaprine  (FLEXERIL ) 10 MG tablet Take 0.5-1 tablets  (5-10 mg total) by mouth 3 (three) times daily as needed for muscle spasms. 15 tablet 0 Unknown   famotidine  (PEPCID ) 40 MG tablet Take 1 tablet (40 mg total) by mouth daily. 90 tablet 3 Unknown   terconazole  (TERAZOL 7 ) 0.4 % vaginal cream Place 1 applicator vaginally at bedtime. Use for seven days 45 g 0 Unknown    Review of Systems  Constitutional: Negative.   HENT: Negative.    Eyes: Negative.   Respiratory: Negative.    Cardiovascular: Negative.   Gastrointestinal: Negative.   Endocrine: Negative.   Genitourinary:  Positive for dysuria and urgency.  Musculoskeletal:  Positive for back pain.  Skin: Negative.   Allergic/Immunologic: Negative.   Neurological: Negative.   Hematological: Negative.   Psychiatric/Behavioral:  Positive for sleep disturbance (x 2 days because of the pain).    Physical Exam   Blood pressure 120/67, pulse (!) 110, temperature 98.6 F (37 C), temperature source Oral, resp. rate 18, height 5' 5 (1.651 m), weight 97.9 kg, last menstrual period 06/21/2023, SpO2 98%, not currently breastfeeding.  Physical Exam Vitals and nursing note reviewed.  Constitutional:      Appearance: Normal appearance. She is obese.  Cardiovascular:     Rate and Rhythm: Tachycardia present.  Pulmonary:     Effort: Pulmonary effort is normal.  Abdominal:     Palpations: Abdomen is soft.     Tenderness: There is right CVA tenderness.  Genitourinary:    Comments: Not indicated Musculoskeletal:        General: Normal range of motion.  Skin:    General: Skin is warm and dry.  Neurological:     Mental Status: She is alert and oriented to person, place, and time.  Psychiatric:        Mood and Affect: Mood normal.        Behavior: Behavior normal.        Thought Content: Thought content normal.        Judgment: Judgment normal.    REACTIVE NST - FHR: 145 bpm / moderate variability / accels present / variable decels present / TOCO: none MAU Course   Procedures  MDM CCUA UCx -- Results pending  Start IV LR Bolus CBC CMP  Results for orders placed or performed during the hospital encounter of 02/09/24 (from the past 24 hours)  Urinalysis, Routine w reflex microscopic -Urine, Clean Catch     Status:  Abnormal   Collection Time: 02/09/24 11:02 PM  Result Value Ref Range   Color, Urine YELLOW YELLOW   APPearance HAZY (A) CLEAR   Specific Gravity, Urine 1.021 1.005 - 1.030   pH 6.0 5.0 - 8.0   Glucose, UA NEGATIVE NEGATIVE mg/dL   Hgb urine dipstick NEGATIVE NEGATIVE   Bilirubin Urine NEGATIVE NEGATIVE   Ketones, ur NEGATIVE NEGATIVE mg/dL   Protein, ur NEGATIVE NEGATIVE mg/dL   Nitrite NEGATIVE NEGATIVE   Leukocytes,Ua SMALL (A) NEGATIVE   RBC / HPF 0-5 0 - 5 RBC/hpf   WBC, UA 0-5 0 - 5 WBC/hpf   Bacteria, UA MANY (A) NONE SEEN   Squamous Epithelial / HPF 0-5 0 - 5 /HPF   Mucus PRESENT     US  Renal Result Date: 02/10/2024 EXAM: US  Retroperitoneum Complete, Renal. CLINICAL HISTORY: Back pain. [redacted] weeks pregnant. Urinary urgency Kidney stones. Right stent placement in 2022 during pregnancy. TECHNIQUE: Real-time ultrasound of the retroperitoneum (complete) with image documentation. COMPARISON: Ultrasound renal 01/04/24 CT RENAL 02/07/21 FINDINGS: RIGHT KIDNEY: The right kidney measures 11.1 x 4.1 x 5.7 cm, volume 135 ml. Calcified stone of 4 mm within the right kidney. No hydronephrosis or mass visualized. LEFT KIDNEY: The left kidney measures 9.9 x 5.4 x 5.3 cm, volume 147 ml. Calcified stone in the left kidney measuring 4 mm. No hydronephrosis or mass visualized. BLADDER: Unremarkable as visualized. IMPRESSION: 1. Bilateral nonobstructive  4 mm nephrolithiasis. Electronically signed by: Morgane Naveau MD 02/10/2024 02:49 AM EDT RP Workstation: [id number]   Assessment and Plan  1. Bilateral kidney stones (Primary) Comments: 4 mm non-obstructive stones bilaterally - Prescription for: Flomax  0.4 mg po every day  - Information  provided on kidney stones   2. Back pain affecting pregnancy in second trimester - Information provided on flank pain  3. [redacted] weeks gestation of pregnancy   - Discharge home - Keep scheduled appt with [facility] on 02/12/2024 - Patient verbalized an understanding of the plan of care and agrees.   Ala Cart, CNM 02/10/2024, 12:28 AM

## 2024-02-11 LAB — CULTURE, OB URINE: Culture: <10000 — AB

## 2024-02-12 ENCOUNTER — Ambulatory Visit (INDEPENDENT_AMBULATORY_CARE_PROVIDER_SITE_OTHER): Payer: Self-pay | Admitting: Obstetrics and Gynecology

## 2024-02-12 ENCOUNTER — Other Ambulatory Visit

## 2024-02-12 VITALS — BP 110/75 | HR 99 | Wt 216.6 lb

## 2024-02-12 DIAGNOSIS — Z3A28 28 weeks gestation of pregnancy: Secondary | ICD-10-CM | POA: Diagnosis not present

## 2024-02-12 DIAGNOSIS — O0990 Supervision of high risk pregnancy, unspecified, unspecified trimester: Secondary | ICD-10-CM | POA: Diagnosis not present

## 2024-02-12 DIAGNOSIS — O09899 Supervision of other high risk pregnancies, unspecified trimester: Secondary | ICD-10-CM

## 2024-02-12 DIAGNOSIS — N200 Calculus of kidney: Secondary | ICD-10-CM

## 2024-02-12 DIAGNOSIS — A6004 Herpesviral vulvovaginitis: Secondary | ICD-10-CM | POA: Diagnosis not present

## 2024-02-12 DIAGNOSIS — Z8279 Family history of other congenital malformations, deformations and chromosomal abnormalities: Secondary | ICD-10-CM | POA: Diagnosis not present

## 2024-02-12 DIAGNOSIS — O99210 Obesity complicating pregnancy, unspecified trimester: Secondary | ICD-10-CM

## 2024-02-12 MED ORDER — CYCLOBENZAPRINE HCL 10 MG PO TABS: 5.0000 mg | ORAL_TABLET | Freq: Three times a day (TID) | ORAL | 0 refills | Status: AC | PRN

## 2024-02-12 NOTE — Progress Notes (Signed)
   PRENATAL VISIT NOTE  Subjective:  Alyssa Howell is a 23 y.o. [id number] at [redacted]w[redacted]d being seen today for ongoing prenatal care.  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  Patient reports bad pressure in vagina, similar to when she had preterm baby in prior pregnancy at 34 weeks.  Contractions: Not present. Vag. Bleeding: None.  Movement: Present. Denies leaking of fluid.   The following portions of the patient's history were reviewed and updated as appropriate: allergies, current medications, past family history, past medical history, past social history, past surgical history and problem list.   Objective:    Vitals:   02/12/24 1014  BP: 110/75  Pulse: 99  Weight: 216 lb 9.6 oz (98.2 kg)   Fetal Status:  Fetal Heart Rate (bpm): 148   Movement: Present    General: Alert, oriented and cooperative. Patient is in no acute distress.  Skin: Skin is warm and dry. No rash noted.   Cardiovascular: Normal heart rate noted  Respiratory: Normal respiratory effort, no problems with respiration noted  Abdomen: Soft, gravid, appropriate for gestational age.  Pain/Pressure: Present     Pelvic: Cervical exam deferred        Extremities: Normal range of motion.  Edema: Trace  Mental Status: Normal mood and affect. Normal behavior. Normal judgment and thought content.   Assessment and Plan:  Pregnancy: [id number] at [redacted]w[redacted]d  1. [redacted] weeks gestation of pregnancy (Primary) Counseled regarding risks/benefits of Tdap vaccine, patient accepts vaccine.  Already had flu shot - Glucose Tolerance, 2 Hours w/1 Hour - CBC - HIV  antibody (with reflex) - RPR  2. Herpes simplex vulvovaginitis  3. History of preterm delivery, currently pregnant  4. Supervision of high risk pregnancy, antepartum Reviewed no interventions for vaginal pressure at this point, just go to MAU if breaks water or thinks she's in labor.  5. Family history of congenital heart defect-prev child double aortic valve Fetal echo scheduled for 10/23  6. Obesity affecting pregnancy, antepartum, unspecified obesity type  7. Recurrent nephrolithiasis Seen in MAU 02/10/24 Given flomax  Doing okay   Preterm labor symptoms and general obstetric precautions including but not limited to vaginal bleeding, contractions, leaking of fluid and fetal movement were reviewed in detail with the patient. Please refer to After Visit Summary for other counseling recommendations.   Return in about 2 weeks (around 02/26/2024) for high OB.  Future Appointments  Date Time Provider Department Center  03/13/2024 11:15 AM [facility] PROVIDER 1 [facility] [place]  03/13/2024 11:30 AM [facility] US2 [facility]    Burnard CHRISTELLA Moats, MD

## 2024-02-13 ENCOUNTER — Ambulatory Visit: Payer: Self-pay | Admitting: Obstetrics and Gynecology

## 2024-02-13 LAB — HIV ANTIBODY (ROUTINE TESTING W REFLEX): HIV Screen 4th Generation wRfx: NONREACTIVE

## 2024-02-13 LAB — CBC
Hematocrit: 31.4 % — ABNORMAL LOW (ref 34.0–46.6)
Hemoglobin: 10.1 g/dL — ABNORMAL LOW (ref 11.1–15.9)
MCH: 31.3 pg (ref 26.6–33.0)
MCHC: 32.2 g/dL (ref 31.5–35.7)
MCV: 97 fL (ref 79–97)
Platelets: 332 x10E3/uL (ref 150–450)
RBC: 3.23 x10E6/uL — ABNORMAL LOW (ref 3.77–5.28)
RDW: 11.6 % — ABNORMAL LOW (ref 11.7–15.4)
WBC: 12.6 x10E3/uL — ABNORMAL HIGH (ref 3.4–10.8)

## 2024-02-13 LAB — RPR: RPR Ser Ql: NONREACTIVE

## 2024-02-13 LAB — GLUCOSE TOLERANCE, 2 HOURS W/ 1HR
Glucose, 1 hour: 105 mg/dL (ref 70–179)
Glucose, 2 hour: 119 mg/dL (ref 70–152)
Glucose, Fasting: 72 mg/dL (ref 70–91)

## 2024-02-14 DIAGNOSIS — O35BXX0 Maternal care for other (suspected) fetal abnormality and damage, fetal cardiac anomalies, not applicable or unspecified: Secondary | ICD-10-CM | POA: Diagnosis not present

## 2024-02-14 DIAGNOSIS — Z8279 Family history of other congenital malformations, deformations and chromosomal abnormalities: Secondary | ICD-10-CM | POA: Diagnosis not present

## 2024-02-24 ENCOUNTER — Inpatient Hospital Stay (HOSPITAL_COMMUNITY)
Admission: AD | Admit: 2024-02-24 | Discharge: 2024-02-25 | Disposition: A | Discharge status: Home / Self Care | Attending: Obstetrics and Gynecology | Admitting: Obstetrics and Gynecology

## 2024-02-24 ENCOUNTER — Encounter (HOSPITAL_COMMUNITY): Payer: Self-pay | Admitting: Obstetrics and Gynecology

## 2024-02-24 ENCOUNTER — Inpatient Hospital Stay (HOSPITAL_COMMUNITY)

## 2024-02-24 ENCOUNTER — Other Ambulatory Visit: Payer: Self-pay

## 2024-02-24 DIAGNOSIS — Z113 Encounter for screening for infections with a predominantly sexual mode of transmission: Secondary | ICD-10-CM | POA: Diagnosis present

## 2024-02-24 DIAGNOSIS — N200 Calculus of kidney: Secondary | ICD-10-CM | POA: Diagnosis not present

## 2024-02-24 DIAGNOSIS — R10A0 Flank pain, unspecified side: Secondary | ICD-10-CM | POA: Diagnosis not present

## 2024-02-24 DIAGNOSIS — Z3A29 29 weeks gestation of pregnancy: Secondary | ICD-10-CM

## 2024-02-24 DIAGNOSIS — B9689 Other specified bacterial agents as the cause of diseases classified elsewhere: Secondary | ICD-10-CM | POA: Diagnosis not present

## 2024-02-24 DIAGNOSIS — O23593 Infection of other part of genital tract in pregnancy, third trimester: Secondary | ICD-10-CM | POA: Insufficient documentation

## 2024-02-24 DIAGNOSIS — O99013 Anemia complicating pregnancy, third trimester: Secondary | ICD-10-CM | POA: Diagnosis present

## 2024-02-24 DIAGNOSIS — O09293 Supervision of pregnancy with other poor reproductive or obstetric history, third trimester: Secondary | ICD-10-CM | POA: Insufficient documentation

## 2024-02-24 DIAGNOSIS — N132 Hydronephrosis with renal and ureteral calculous obstruction: Secondary | ICD-10-CM | POA: Insufficient documentation

## 2024-02-24 DIAGNOSIS — O99891 Other specified diseases and conditions complicating pregnancy: Secondary | ICD-10-CM | POA: Diagnosis not present

## 2024-02-24 DIAGNOSIS — Z87442 Personal history of urinary calculi: Secondary | ICD-10-CM | POA: Insufficient documentation

## 2024-02-24 DIAGNOSIS — Z3493 Encounter for supervision of normal pregnancy, unspecified, third trimester: Secondary | ICD-10-CM

## 2024-02-24 LAB — URINALYSIS, ROUTINE W REFLEX MICROSCOPIC
Bilirubin Urine: NEGATIVE
Glucose, UA: NEGATIVE mg/dL
Ketones, ur: NEGATIVE mg/dL
Nitrite: NEGATIVE
Protein, ur: NEGATIVE mg/dL
Specific Gravity, Urine: 1.026 (ref 1.005–1.030)
pH: 5 (ref 5.0–8.0)

## 2024-02-24 LAB — BASIC METABOLIC PANEL WITH GFR
Anion gap: 10 (ref 5–15)
BUN: 12 mg/dL (ref 6–20)
CO2: 22 mmol/L (ref 22–32)
Calcium: 8.4 mg/dL — ABNORMAL LOW (ref 8.9–10.3)
Chloride: 104 mmol/L (ref 98–111)
Creatinine, Ser: 0.65 mg/dL (ref 0.44–1.00)
GFR, Estimated: >60 mL/min (ref >60)
Glucose, Bld: 87 mg/dL (ref 70–99)
Potassium: 3.7 mmol/L (ref 3.5–5.1)
Sodium: 136 mmol/L (ref 135–145)

## 2024-02-24 LAB — CBC WITH DIFFERENTIAL/PLATELET
Abs Immature Granulocytes: 0.11 K/uL — ABNORMAL HIGH (ref 0.00–0.07)
Basophils Absolute: 0.1 K/uL (ref 0.0–0.1)
Basophils Relative: 1 %
Eosinophils Absolute: 0.3 K/uL (ref 0.0–0.5)
Eosinophils Relative: 3 %
HCT: 30.5 % — ABNORMAL LOW (ref 36.0–46.0)
Hemoglobin: 9.8 g/dL — ABNORMAL LOW (ref 12.0–15.0)
Immature Granulocytes: 1 %
Lymphocytes Relative: 23 %
Lymphs Abs: 2.8 K/uL (ref 0.7–4.0)
MCH: 31.2 pg (ref 26.0–34.0)
MCHC: 32.1 g/dL (ref 30.0–36.0)
MCV: 97.1 fL (ref 80.0–100.0)
Monocytes Absolute: 0.8 K/uL (ref 0.1–1.0)
Monocytes Relative: 6 %
Neutro Abs: 8.2 K/uL — ABNORMAL HIGH (ref 1.7–7.7)
Neutrophils Relative %: 66 %
Platelets: 329 K/uL (ref 150–400)
RBC: 3.14 MIL/uL — ABNORMAL LOW (ref 3.87–5.11)
RDW: 12.5 % (ref 11.5–15.5)
WBC: 12.3 K/uL — ABNORMAL HIGH (ref 4.0–10.5)
nRBC: 0 % (ref 0.0–0.2)

## 2024-02-24 LAB — WET PREP, GENITAL
Sperm: NONE SEEN
Trich, Wet Prep: NONE SEEN
WBC, Wet Prep HPF POC: >=10 — AB (ref <10)
Yeast Wet Prep HPF POC: NONE SEEN

## 2024-02-24 LAB — RUPTURE OF MEMBRANE (ROM)PLUS: Rom Plus: NEGATIVE

## 2024-02-24 LAB — FOLATE: Folate: >20 ng/mL (ref >5.9)

## 2024-02-24 MED ORDER — ONDANSETRON 4 MG PO TBDP
8.0000 mg | ORAL_TABLET | Freq: Once | ORAL | Status: AC
  Administered 2024-02-24: 8 mg via ORAL
  Filled 2024-02-24: qty 2

## 2024-02-24 MED ORDER — OXYCODONE HCL 5 MG PO TABS: 5.0000 mg | ORAL_TABLET | ORAL | 0 refills | Status: DC | PRN

## 2024-02-24 MED ORDER — HYDROMORPHONE HCL 2 MG PO TABS: 1.0000 mg | ORAL_TABLET | ORAL | 0 refills | Status: DC | PRN

## 2024-02-24 MED ORDER — CLINDAMYCIN HCL 300 MG PO CAPS: 300.0000 mg | ORAL_CAPSULE | Freq: Two times a day (BID) | ORAL | 0 refills | Status: AC

## 2024-02-24 MED ORDER — HYDROMORPHONE HCL 1 MG/ML IJ SOLN
1.0000 mg | Freq: Once | INTRAMUSCULAR | Status: AC
  Administered 2024-02-24: 1 mg via INTRAMUSCULAR
  Filled 2024-02-24: qty 1

## 2024-02-24 MED ORDER — OXYCODONE HCL 5 MG PO TABS
5.0000 mg | ORAL_TABLET | Freq: Once | ORAL | Status: AC
  Administered 2024-02-24: 5 mg via ORAL
  Filled 2024-02-24: qty 1

## 2024-02-24 MED ORDER — LACTATED RINGERS IV BOLUS
1000.0000 mL | Freq: Once | INTRAVENOUS | Status: AC
  Administered 2024-02-24: 1000 mL via INTRAVENOUS

## 2024-02-24 MED ORDER — HYDROMORPHONE HCL 1 MG/ML IJ SOLN
0.5000 mg | Freq: Once | INTRAMUSCULAR | Status: AC
Start: 2024-02-24 — End: 2024-02-24
  Administered 2024-02-24: 0.5 mg via INTRAVENOUS
  Filled 2024-02-24: qty 1

## 2024-02-24 NOTE — MAU Provider Note (Cosign Needed Addendum)
 Chief Complaint:  Rupture of Membranes and Back Pain   HPI   None     Alyssa Howell is a 23 y.o. [id number] at [redacted]w[redacted]d who presents to maternity admissions reporting leakage of fluid for 4-5 days and 10/10 back pain radiating into pelvis for couple months. The combination of symptoms brought her in. Noted that when she got up from the chair in triage the chair was soaking wet, has been soaking through underwear daily. Has been taking Flomax  and oxycodone  for kidney stone pain, but ran out of oxycodone  about 2 days ago. Denies blood in the urine. Having contractions sporadically throughout the day, nothing consistent. Denies decreased fetal movement, vaginal bleeding.  Denies headaches, changes in vision, shortness of breath, chest pain, abdominal pain, diarrhea, constipation, nausea, vomiting, or leg swelling.  Pregnancy Course: Pregnancy is complicated by kidney stones, hx of HSV, and hx of preterm delivery.   Past Medical History:  Diagnosis Date   ADHD    Anemia    Chlamydia 2022   Depression    GERD (gastroesophageal reflux disease) 10/09/2019   HSV-2 infection    per pt/ seen in ED [facility]   Kidney stones    Seizures (HCC)    x 1 in 5th grade after taking ability. No other episodes.   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   Vaginal pain at introitus  Referred to Pelvic Floor PT     OB History  Gravida Para Term Preterm AB Living  5 2 1 1 2 2   SAB IAB Ectopic Multiple Live Births  1 1 0 0 2    # Outcome Date GA Lbr Len/2nd Weight Sex Type Anes PTL Lv  5 Current           4 IAB 01/2023          3 Preterm 08/05/22 [redacted]w[redacted]d 26:50 / 00:32 2590 g M Vag-Spont EPI  LIV  2 Term 03/2021 [redacted]w[redacted]d  3585 g M Vag-Spont   LIV  1 SAB            Past Surgical History:  Procedure Laterality Date   CYSTOSCOPY/URETEROSCOPY/HOLMIUM LASER/STENT PLACEMENT Right 08/02/2020   Procedure: CYSTOSCOPY/URETEROSCOPY/HOLMIUM LASER/STENT PLACEMENT;  Surgeon: Penne Knee, MD;   Location: [facility] ORS;  Service: Urology;  Laterality: Right;   DILATION AND CURETTAGE OF UTERUS N/A 08/12/2022   Procedure: DILATATION AND CURETTAGE, ULTRASOUND GUIDED;  Surgeon: Zina Jerilynn LABOR, MD;  Location: [facility] OR;  Service: Gynecology;  Laterality: N/A;   FOOT SURGERY Bilateral    metal screws in toes,2020   TONSILLECTOMY     Family History  Problem Relation Age of Onset   Hepatitis C Mother    Valvular heart disease Mother    Other Father        no contact in over 65yrs   Cancer Maternal Grandmother    Breast cancer Maternal Grandmother    Diabetes Other    Heart attack Other    Asthma Neg Hx    Heart disease Neg Hx    Hypertension Neg Hx    Social History   Tobacco Use   Smoking status: Never   Smokeless tobacco: Never  Vaping Use   Vaping status: Never Used  Substance Use Topics   Alcohol use: Not Currently   Drug use: Not Currently    Types: Marijuana    Comment: last used the end of September 2022 as of 02/07/2021   Allergies  Allergen Reactions   Abilify [Aripiprazole] Other (  See Comments)    Seizures   Metronidazole  Hives and Other (See Comments)    Inguinal area   Medications Prior to Admission  Medication Sig Dispense Refill Last Dose/Taking   cyclobenzaprine  (FLEXERIL ) 10 MG tablet Take 0.5-1 tablets (5-10 mg total) by mouth 3 (three) times daily as needed for muscle spasms. 15 tablet 0 Past Week   famotidine  (PEPCID ) 40 MG tablet Take 1 tablet (40 mg total) by mouth daily. 90 tablet 3 Past Week   oxycodone  (OXY-IR) 5 MG capsule Take 5 mg by mouth every 4 (four) hours as needed.   02/22/2024   Prenatal Vit-Fe Phos-FA-Omega (VITAFOL  GUMMIES) 3.33-0.333-34.8 MG CHEW Chew 3 tablets by mouth daily. 90 tablet 11 02/23/2024   tamsulosin  (FLOMAX ) 0.4 MG CAPS capsule Take 1 capsule (0.4 mg total) by mouth daily. 30 capsule 2 02/24/2024   valACYclovir  (VALTREX ) 500 MG tablet Take 1 tablet (500 mg total) by mouth 2 (two) times daily. 60 tablet 4 02/24/2024    acetaminophen  (TYLENOL ) 325 MG tablet Take 650 mg by mouth every 4 (four) hours as needed for mild pain (pain score 1-3) or moderate pain (pain score 4-6).      acetaminophen -caffeine  (EXCEDRIN TENSION HEADACHE) 500-65 MG TABS per tablet Take 2 tablets by mouth 4 (four) times daily as needed (For headaches). 60 tablet 0    ferrous sulfate  325 (65 FE) MG tablet Take 1 tablet (325 mg total) by mouth daily. 30 tablet 0 Unknown   magic mouthwash (lidocaine , diphenhydrAMINE , alum & mag hydroxide) suspension Swish and spit 5 mLs 3 (three) times daily. (Patient not taking: Reported on 02/12/2024) 360 mL 0    metoCLOPramide  (REGLAN ) 10 MG tablet Take 1 tablet (10 mg total) by mouth every 6 (six) hours as needed (Headcahes). 30 tablet 0    terconazole  (TERAZOL 7 ) 0.4 % vaginal cream Place 1 applicator vaginally at bedtime. Use for seven days 45 g 0     I have reviewed patient's Past Medical Hx, Surgical Hx, Family Hx, Social Hx, medications and allergies.   ROS  Pertinent items noted in HPI and remainder of comprehensive ROS otherwise negative.   PHYSICAL EXAM  Patient Vitals for the past 24 hrs:  BP Temp Temp src Pulse Resp SpO2 Height Weight  02/24/24 2123 (!) 118/50 -- -- 92 -- -- -- --  02/24/24 2119 -- -- -- -- -- 100 % -- --  02/24/24 2114 -- -- -- -- -- 100 % -- --  02/24/24 2109 -- -- -- -- -- 99 % -- --  02/24/24 2104 -- -- -- -- -- 97 % -- --  02/24/24 2059 -- -- -- -- -- 98 % -- --  02/24/24 2054 -- -- -- -- -- 98 % -- --  02/24/24 2049 -- -- -- -- -- 98 % -- --  02/24/24 2044 -- -- -- -- -- 99 % -- --  02/24/24 2039 -- -- -- -- -- 99 % -- --  02/24/24 2034 -- -- -- -- -- 99 % -- --  02/24/24 2029 -- -- -- -- -- 94 % -- --  02/24/24 2024 -- -- -- -- -- 99 % -- --  02/24/24 2019 -- -- -- -- -- 100 % -- --  02/24/24 2014 -- -- -- -- -- 100 % -- --  02/24/24 2009 -- -- -- -- -- 100 % -- --  02/24/24 1909 -- -- -- -- -- 100 % -- --  02/24/24 1904 -- -- -- -- -- 100 % -- --  02/24/24  1859 -- -- -- -- -- 99 % -- --  02/24/24 1854 -- -- -- -- -- 100 % -- --  02/24/24 1849 -- -- -- -- -- 100 % -- --  02/24/24 1844 -- -- -- -- -- 100 % -- --  02/24/24 1839 -- -- -- -- -- 99 % -- --  02/24/24 1834 -- -- -- -- -- 100 % -- --  02/24/24 1829 -- -- -- -- -- 100 % -- --  02/24/24 1824 -- -- -- -- -- 99 % -- --  02/24/24 1819 -- -- -- -- -- 100 % -- --  02/24/24 1814 -- -- -- -- -- 99 % -- --  02/24/24 1809 -- -- -- -- -- 99 % -- --  02/24/24 1804 -- -- -- -- -- 100 % -- --  02/24/24 1759 -- -- -- -- -- 100 % -- --  02/24/24 1749 -- -- -- -- -- 98 % -- --  02/24/24 1744 -- -- -- -- -- 99 % -- --  02/24/24 1739 -- -- -- -- -- 98 % -- --  02/24/24 1734 -- -- -- -- -- 98 % -- --  02/24/24 1729 -- -- -- -- -- 99 % -- --  02/24/24 1724 -- -- -- -- -- 99 % -- --  02/24/24 1719 -- -- -- -- -- 100 % -- --  02/24/24 1714 -- -- -- -- -- 100 % -- --  02/24/24 1709 -- -- -- -- -- 100 % -- --  02/24/24 1704 -- -- -- -- -- 100 % -- --  02/24/24 1659 -- -- -- -- -- 100 % -- --  02/24/24 1654 -- -- -- -- -- 99 % -- --  02/24/24 1649 -- -- -- -- -- 100 % -- --  02/24/24 1646 128/75 -- -- (!) 112 -- -- -- --  02/24/24 1621 122/72 98.6 F (37 C) Oral (!) 112 18 99 % -- --  02/24/24 1614 -- -- -- -- -- -- 5' 5 (1.651 m) 99.1 kg    Constitutional: Well-developed, well-nourished female in no acute distress.  Cardiovascular: Warm and well-perfused Respiratory: normal effort, no problems with respiration noted GI: Gravid, TTP in suprapubic region MS: Extremities nontender, no edema, normal ROM Neurologic: Alert and oriented x 4.  GU: bilateral CVA tenderness Pelvic: normal external female, thin yellow discharge appreciated, no blood, cervix clean.     Fetal Tracing: Baseline: 130 bpm Variability: Moderate Accelerations: Present Decelerations: Absent Toco: Flat   Labs: Results for orders placed or performed during the hospital encounter of 02/24/24 (from the past 24 hours)   Rupture of Membrane (ROM) Plus     Status: None   Collection Time: 02/24/24  5:23 PM  Result Value Ref Range   Rom Plus NEGATIVE   Wet prep, genital     Status: Abnormal   Collection Time: 02/24/24  5:23 PM  Result Value Ref Range   Yeast Wet Prep HPF POC NONE SEEN NONE SEEN   Trich, Wet Prep NONE SEEN NONE SEEN   Clue Cells Wet Prep HPF POC PRESENT (A) NONE SEEN   WBC, Wet Prep HPF POC >=10 (A) <10   Sperm NONE SEEN   Urinalysis, Routine w reflex microscopic -Urine, Clean Catch     Status: Abnormal   Collection Time: 02/24/24  5:32 PM  Result Value Ref Range   Color, Urine YELLOW YELLOW   APPearance HAZY (A) CLEAR   Specific Gravity, Urine  1.026 1.005 - 1.030   pH 5.0 5.0 - 8.0   Glucose, UA NEGATIVE NEGATIVE mg/dL   Hgb urine dipstick MODERATE (A) NEGATIVE   Bilirubin Urine NEGATIVE NEGATIVE   Ketones, ur NEGATIVE NEGATIVE mg/dL   Protein, ur NEGATIVE NEGATIVE mg/dL   Nitrite NEGATIVE NEGATIVE   Leukocytes,Ua MODERATE (A) NEGATIVE   RBC / HPF 11-20 0 - 5 RBC/hpf   WBC, UA 0-5 0 - 5 WBC/hpf   Bacteria, UA RARE (A) NONE SEEN   Squamous Epithelial / HPF 6-10 0 - 5 /HPF   Mucus PRESENT    Ca Oxalate Crys, UA PRESENT   Basic metabolic panel     Status: Abnormal   Collection Time: 02/24/24  9:24 PM  Result Value Ref Range   Sodium 136 135 - 145 mmol/L   Potassium 3.7 3.5 - 5.1 mmol/L   Chloride 104 98 - 111 mmol/L   CO2 22 22 - 32 mmol/L   Glucose, Bld 87 70 - 99 mg/dL   BUN 12 6 - 20 mg/dL   Creatinine, Ser 9.34 0.44 - 1.00 mg/dL   Calcium 8.4 (L) 8.9 - 10.3 mg/dL   GFR, Estimated >39 >39 mL/min   Anion gap 10 5 - 15  CBC with Differential/Platelet     Status: Abnormal   Collection Time: 02/24/24  9:24 PM  Result Value Ref Range   WBC 12.3 (H) 4.0 - 10.5 K/uL   RBC 3.14 (L) 3.87 - 5.11 MIL/uL   Hemoglobin 9.8 (L) 12.0 - 15.0 g/dL   HCT 69.4 (L) 63.9 - 53.9 %   MCV 97.1 80.0 - 100.0 fL   MCH 31.2 26.0 - 34.0 pg   MCHC 32.1 30.0 - 36.0 g/dL   RDW 87.4 88.4 - 84.4 %    Platelets 329 150 - 400 K/uL   nRBC 0.0 0.0 - 0.2 %   Neutrophils Relative % 66 %   Neutro Abs 8.2 (H) 1.7 - 7.7 K/uL   Lymphocytes Relative 23 %   Lymphs Abs 2.8 0.7 - 4.0 K/uL   Monocytes Relative 6 %   Monocytes Absolute 0.8 0.1 - 1.0 K/uL   Eosinophils Relative 3 %   Eosinophils Absolute 0.3 0.0 - 0.5 K/uL   Basophils Relative 1 %   Basophils Absolute 0.1 0.0 - 0.1 K/uL   Immature Granulocytes 1 %   Abs Immature Granulocytes 0.11 (H) 0.00 - 0.07 K/uL   Imaging:  US  RENAL Result Date: 02/24/2024 EXAM: US  Retroperitoneum Complete, Renal. CLINICAL HISTORY: Flank pain in pregnant patient [id number]; [id number] Flank pain with history of urolithiasis [id number]. The patient is pregnant in the early third trimester. TECHNIQUE: Real-time ultrasound of the retroperitoneum (complete) with image documentation. COMPARISON: Renal ultrasound recently 02/10/2024. The last CT was without contrast and dated 02/07/2021 showing scattered bilateral nonobstructing micronephrolithiasis. FINDINGS: RIGHT KIDNEY: The right kidney is 11.7 x 4.5 x 6.2 cm, volume 171.3 ml. A 6 mm calyceal stone is noted in the upper pole and a 5 mm calyceal stone in the inferior pole. There is normal cortical thickness and echogenicity. Mild hydronephrosis new from 02/10/24. No renal mass is seen. LEFT KIDNEY: Left kidney is 10.9 x 5.7 x 6.7 cm, volume 207 ml. 6 mm calyceal stone noted in the upper pole. There is normal cortical thickness and echogenicity. No mass or hydronephrosis are seen. BLADDER: The bladder wall is unremarkable. Ureteral jets were not observed on the exam. IMPRESSION: 1. Right kidney with new mild hydronephrosis in the setting of pregnancy,  with 6 mm and 5 mm calyceal stones. Unknown if the hydronephrosis is due to an obstructing stone or related to the pregnancy and physiologic . 2. Left kidney with a 6 mm calyceal stone and no hydronephrosis. Electronically signed by: Francis Quam MD 02/24/2024 08:25 PM EST RP  Workstation: [id number]    MDM & MAU COURSE  MDM: Moderate  MAU Course: Orders Placed This Encounter  Procedures   Wet prep, genital   Culture, OB Urine   US  RENAL   Urinalysis, Routine w reflex microscopic -Urine, Clean Catch   Rupture of Membrane (ROM) Plus   Basic metabolic panel   CBC with Differential/Platelet   Ferritin   Iron and TIBC   Vitamin B12   Folate   Discharge patient   Meds ordered this encounter  Medications   oxyCODONE  (Oxy IR/ROXICODONE ) immediate release tablet 5 mg    Refill:  0   HYDROmorphone  (DILAUDID ) injection 1 mg   lactated ringers  bolus 1,000 mL   HYDROmorphone  (DILAUDID ) injection 0.5 mg   ondansetron  (ZOFRAN -ODT) disintegrating tablet 8 mg   DISCONTD: oxyCODONE  (OXY IR/ROXICODONE ) 5 MG immediate release tablet    Sig: Take 1 tablet (5 mg total) by mouth every 4 (four) hours as needed for severe pain (pain score 7-10) or breakthrough pain. Can take every 4-6 hours as needed for severe breakthrough pain.    Dispense:  30 tablet    Refill:  0   clindamycin  (CLEOCIN ) 300 MG capsule    Sig: Take 1 capsule (300 mg total) by mouth 2 (two) times daily for 7 days.    Dispense:  14 capsule    Refill:  0   HYDROmorphone  (DILAUDID ) 2 MG tablet    Sig: Take 0.5 tablets (1 mg total) by mouth every 4 (four) hours as needed for severe pain (pain score 7-10).    Dispense:  30 tablet    Refill:  0    Please cancel prescription for oxycodone  that was previously sent   Tachycardic, otherwise VSS. Exam with significant CVA tenderness and suprapubic tenderness. SSE showing thin, yellow discharge, negative pool. Swabs collected. Urine showing microscopic hematuria and crystals. Given oxycodone  5mg  with no change in pain level. Wet prep showing clue cells, otherwise negative. ROM plus negative. Given 1mg  IM dilaudid  and sent for renal ultrasound. Patient signed out to [facility] at 2020 for further management. Charlie Courts, MD  Family Medicine - Obstetrics  Fellow  Renal US  shows 6 mm and 5 mm stones in the right kidney with mild hydronephrosis, and 6 mm stone in left kidney. These have increased compared to 4 mm stones bilaterally on US  on 10/19.  Given bacteria and leukocytes on UA, will send culture to rule out UTI. Checking CBC for infection and BMP for renal function. Discussed with Dr. Izell. If CBC and BMP within normal limits, shared decision making for admission vs outpatient management of nonobstructing kidney stones. CBC stable. BMP within normal limits, kidney function normal. Dr. Izell at bedside, reviewed options for discharge with outpatient pain management for kidney stones versus admission. Patient would like another dose of pain medicine prior to discharge. Dr. Georganne with urology reviewed US  images. Very mild hydronephrosis. Agrees with watchful waiting and pain management, but low threshold for noncontrast CT if patient returns to rule out obstruction definitively.  ASSESSMENT   1. Bilateral kidney stones   2. Intact amniotic membranes during pregnancy in third trimester   3. Bacterial vaginosis   4. [redacted] weeks gestation of pregnancy  PLAN  Admit for pain management for bilateral nephrolithiasis. Metronidazole  for BV. OB urine culture results pending. Tamsulosin  0.4 mg daily. Discussed she will need this medicine until stones pass to relax ureters and facilitate passage of stones. Encouraged aggressive PO hydration. Tylenol  1000 mg every 6 hours for baseline pain control. Hydromorphone  1 mg every 4-6 hours for severe breakthrough pain. Will get noncontrast CT if patient returns to MAU for stones.     Allergies as of 02/24/2024       Reactions   Abilify [aripiprazole] Other (See Comments)   Seizures   Metronidazole  Hives, Other (See Comments)   Inguinal area        Medication List     STOP taking these medications    oxycodone  5 MG capsule Commonly known as: OXY-IR       TAKE these medications     acetaminophen  325 MG tablet Commonly known as: TYLENOL  Take 650 mg by mouth every 4 (four) hours as needed for mild pain (pain score 1-3) or moderate pain (pain score 4-6).   acetaminophen -caffeine  500-65 MG Tabs per tablet Commonly known as: EXCEDRIN TENSION HEADACHE Take 2 tablets by mouth 4 (four) times daily as needed (For headaches).   clindamycin  300 MG capsule Commonly known as: CLEOCIN  Take 1 capsule (300 mg total) by mouth 2 (two) times daily for 7 days.   cyclobenzaprine  10 MG tablet Commonly known as: FLEXERIL  Take 0.5-1 tablets (5-10 mg total) by mouth 3 (three) times daily as needed for muscle spasms.   famotidine  40 MG tablet Commonly known as: PEPCID  Take 1 tablet (40 mg total) by mouth daily.   ferrous sulfate  325 (65 FE) MG tablet Take 1 tablet (325 mg total) by mouth daily.   HYDROmorphone  2 MG tablet Commonly known as: DILAUDID  Take 0.5 tablets (1 mg total) by mouth every 4 (four) hours as needed for severe pain (pain score 7-10).   magic mouthwash (lidocaine , diphenhydrAMINE , alum & mag hydroxide) suspension Swish and spit 5 mLs 3 (three) times daily.   metoCLOPramide  10 MG tablet Commonly known as: REGLAN  Take 1 tablet (10 mg total) by mouth every 6 (six) hours as needed (Headcahes).   tamsulosin  0.4 MG Caps capsule Commonly known as: FLOMAX  Take 1 capsule (0.4 mg total) by mouth daily.   terconazole  0.4 % vaginal cream Commonly known as: TERAZOL 7  Place 1 applicator vaginally at bedtime. Use for seven days   valACYclovir  500 MG tablet Commonly known as: Valtrex  Take 1 tablet (500 mg total) by mouth 2 (two) times daily.   Vitafol  Gummies 3.33-0.333-34.8 MG Chew Chew 3 tablets by mouth daily.       Joesph DELENA Sear, PA

## 2024-02-24 NOTE — Discharge Instructions (Addendum)
 KIDNEY STONES: Pregnancy may increase the chance of developing kidney stones. During pregnancy, more calcium is excreted in urine which can increase the likelihood of developing kidney stones. Additionally, urine pH increases (becomes less acidic) which increased the chance of stones. Increased progesterone levels can cause urinary stasis, and decreased fluid intake also contribute to kidney stone formation. MANAGEMENT Increase fluid intake. Aim for 100 ounces of water each day to help flush the kidney stones out. Tamsulosin  (Flomax ). This medicine helps to relax the ureters (tubes from the kidneys to the bladder) to allow stones to pass easier. Pain management. Baseline control with Tylenol  1000 mg every 6 hours on a schedule. Use the hydromorphone  1 mg for breakthrough pain not controlled with Tylenol . Heating pads or warm showers can also be helpful. Time. It can take 2-4 weeks for stones to pass completely.

## 2024-02-24 NOTE — MAU Note (Signed)
 Alyssa Howell is a 23 y.o. at [redacted]w[redacted]d here in MAU reporting: she thinks she may leaking fluid since 0500 this morning, reports fluid is clear.  Denies VB, has had a few ctxs.  Endorses +FM. Reports currently has kidney stones, having back pain and frequent urination.  Denies painful urination or blood in urine.  LMP: 06/21/2023 Onset of complaint: 0500 Pain score: 10 Vitals:   02/24/24 1621  BP: 122/72  Pulse: (!) 112  Resp: 18  Temp: 98.6 F (37 C)  SpO2: 99%     FHT: 133bpm  Lab orders placed from triage: UA

## 2024-02-25 LAB — IRON AND TIBC
Iron: 25 ug/dL — ABNORMAL LOW (ref 28–170)
Saturation Ratios: 4 % — ABNORMAL LOW (ref 10.4–31.8)
TIBC: 715 ug/dL — ABNORMAL HIGH (ref 250–450)
UIBC: 690 ug/dL

## 2024-02-25 LAB — VITAMIN B12: Vitamin B-12: 256 pg/mL (ref 180–914)

## 2024-02-25 LAB — GC/CHLAMYDIA PROBE AMP (~~LOC~~) NOT AT ARMC
Chlamydia: POSITIVE — AB
Comment: NEGATIVE
Comment: NORMAL
Neisseria Gonorrhea: NEGATIVE

## 2024-02-25 LAB — FERRITIN: Ferritin: 5 ng/mL — ABNORMAL LOW (ref 11–307)

## 2024-02-25 MED ORDER — METOCLOPRAMIDE HCL 5 MG/ML IJ SOLN
10.0000 mg | Freq: Once | INTRAMUSCULAR | Status: AC
Start: 2024-02-25 — End: 2024-02-25
  Administered 2024-02-25: 10 mg via INTRAVENOUS
  Filled 2024-02-25: qty 2

## 2024-02-25 MED ORDER — ONDANSETRON 8 MG PO TBDP: 8.0000 mg | ORAL_TABLET | Freq: Three times a day (TID) | ORAL | 0 refills | Status: AC | PRN

## 2024-02-25 MED ORDER — METOCLOPRAMIDE HCL 10 MG PO TABS: 10.0000 mg | ORAL_TABLET | Freq: Four times a day (QID) | ORAL | 0 refills | Status: DC

## 2024-02-26 ENCOUNTER — Telehealth (HOSPITAL_COMMUNITY): Payer: Self-pay

## 2024-02-26 ENCOUNTER — Ambulatory Visit (HOSPITAL_COMMUNITY): Payer: Self-pay

## 2024-02-26 ENCOUNTER — Encounter: Admitting: Obstetrics and Gynecology

## 2024-02-26 LAB — CULTURE, OB URINE

## 2024-02-26 MED ORDER — AZITHROMYCIN 500 MG PO TABS: 1000.0000 mg | ORAL_TABLET | Freq: Once | ORAL | 0 refills | Status: AC

## 2024-02-26 NOTE — Telephone Encounter (Signed)
 This patient tested positive for Chlamydia   She is allergic to Abilify and Metronidazole  I have informed the patient of her results and confirmed her pharmacy is correct in her chart. Treatment has been sent per protocol.   Arlander Katheryn RAMAN, RN

## 2024-02-28 ENCOUNTER — Ambulatory Visit (INDEPENDENT_AMBULATORY_CARE_PROVIDER_SITE_OTHER): Admitting: Obstetrics

## 2024-02-28 VITALS — BP 118/66 | HR 96 | Wt 219.4 lb

## 2024-02-28 DIAGNOSIS — F339 Major depressive disorder, recurrent, unspecified: Secondary | ICD-10-CM | POA: Diagnosis not present

## 2024-02-28 DIAGNOSIS — M5441 Lumbago with sciatica, right side: Secondary | ICD-10-CM | POA: Diagnosis not present

## 2024-02-28 DIAGNOSIS — Z202 Contact with and (suspected) exposure to infections with a predominantly sexual mode of transmission: Secondary | ICD-10-CM

## 2024-02-28 DIAGNOSIS — M5442 Lumbago with sciatica, left side: Secondary | ICD-10-CM | POA: Diagnosis not present

## 2024-02-28 DIAGNOSIS — O09899 Supervision of other high risk pregnancies, unspecified trimester: Secondary | ICD-10-CM

## 2024-02-28 DIAGNOSIS — A6004 Herpesviral vulvovaginitis: Secondary | ICD-10-CM

## 2024-02-28 DIAGNOSIS — O99013 Anemia complicating pregnancy, third trimester: Secondary | ICD-10-CM | POA: Diagnosis not present

## 2024-02-28 DIAGNOSIS — O0990 Supervision of high risk pregnancy, unspecified, unspecified trimester: Secondary | ICD-10-CM | POA: Diagnosis not present

## 2024-02-28 DIAGNOSIS — O99210 Obesity complicating pregnancy, unspecified trimester: Secondary | ICD-10-CM | POA: Diagnosis not present

## 2024-02-28 NOTE — Progress Notes (Signed)
 Pt presents for rob. Pt complains of numbness and pain in legs. No other questions or concerns at this time.

## 2024-02-28 NOTE — Progress Notes (Signed)
 Subjective:  Alyssa Howell is a 23 y.o. [id number] at [redacted]w[redacted]d being seen today for ongoing prenatal care.  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  Patient reports worsening sciaticaand pelvic pressure.  Contractions: Irritability. Vag. Bleeding: None.  Movement: Present. Denies leaking of fluid.   The following portions of the patient's history were reviewed and updated as appropriate: allergies, current medications, past family history, past medical history, past social history, past surgical history and problem list. Problem list updated.  Objective:   Vitals:   02/28/24 0952  BP: 118/66  Pulse: 96  Weight: 219 lb 6.4 oz (99.5 kg)    Fetal Status: Fetal Heart Rate (bpm): 148   Movement: Present     General:  Alert, oriented and cooperative. Patient is in no acute distress.  Skin: Skin is warm and dry. No rash noted.   Cardiovascular: Normal heart rate noted  Respiratory: Normal respiratory effort, no problems with respiration noted  Abdomen: Soft, gravid, appropriate for gestational age. Pain/Pressure: Present     Pelvic:  Cervical exam performed      1cm / long / -3 / Vtx  Extremities: Normal range of motion.  Edema: None  Mental Status: Normal mood and affect. Normal behavior. Normal judgment and thought content.   Urinalysis:      Assessment and Plan:  Pregnancy: [id number] at [redacted]w[redacted]d  1. Supervision of high risk pregnancy, antepartum (Primary)  2. History of preterm delivery, currently pregnant  3. Herpes simplex vulvovaginitis - valtrex  suppression starting  at 32 weeks  4. Potential exposure to STD  5. Anemia in pregnancy, third trimester  6. Depression, recurrent  7. Obesity affecting pregnancy, antepartum, unspecified obesity type   Preterm labor symptoms and general obstetric precautions including but not limited to vaginal bleeding, contractions, leaking of fluid and fetal movement were reviewed in detail with the patient. Please refer to After Visit Summary for other counseling recommendations.   Return in about 2 weeks (around 03/13/2024) for [facility].   Rudy Carlin LABOR, MD 02/28/2024

## 2024-03-09 ENCOUNTER — Inpatient Hospital Stay (HOSPITAL_COMMUNITY)
Admission: AD | Admit: 2024-03-09 | Discharge: 2024-03-10 | Disposition: A | Discharge status: Home / Self Care | Attending: Obstetrics & Gynecology | Admitting: Obstetrics & Gynecology

## 2024-03-09 ENCOUNTER — Encounter (HOSPITAL_COMMUNITY): Payer: Self-pay | Admitting: Obstetrics & Gynecology

## 2024-03-09 ENCOUNTER — Inpatient Hospital Stay (HOSPITAL_COMMUNITY)

## 2024-03-09 DIAGNOSIS — Z3A31 31 weeks gestation of pregnancy: Secondary | ICD-10-CM | POA: Insufficient documentation

## 2024-03-09 DIAGNOSIS — O99013 Anemia complicating pregnancy, third trimester: Secondary | ICD-10-CM | POA: Insufficient documentation

## 2024-03-09 DIAGNOSIS — O26833 Pregnancy related renal disease, third trimester: Secondary | ICD-10-CM | POA: Diagnosis not present

## 2024-03-09 DIAGNOSIS — N390 Urinary tract infection, site not specified: Secondary | ICD-10-CM | POA: Diagnosis not present

## 2024-03-09 DIAGNOSIS — O2343 Unspecified infection of urinary tract in pregnancy, third trimester: Secondary | ICD-10-CM | POA: Diagnosis not present

## 2024-03-09 DIAGNOSIS — N200 Calculus of kidney: Secondary | ICD-10-CM | POA: Diagnosis not present

## 2024-03-09 DIAGNOSIS — R109 Unspecified abdominal pain: Secondary | ICD-10-CM | POA: Diagnosis not present

## 2024-03-09 LAB — COMPREHENSIVE METABOLIC PANEL WITH GFR
ALT: 25 U/L (ref 0–44)
AST: 26 U/L (ref 15–41)
Albumin: 2.4 g/dL — ABNORMAL LOW (ref 3.5–5.0)
Alkaline Phosphatase: 120 U/L (ref 38–126)
Anion gap: 10 (ref 5–15)
BUN: 13 mg/dL (ref 6–20)
CO2: 23 mmol/L (ref 22–32)
Calcium: 8.9 mg/dL (ref 8.9–10.3)
Chloride: 102 mmol/L (ref 98–111)
Creatinine, Ser: 0.63 mg/dL (ref 0.44–1.00)
GFR, Estimated: >60 mL/min (ref >60)
Glucose, Bld: 111 mg/dL — ABNORMAL HIGH (ref 70–99)
Potassium: 3.5 mmol/L (ref 3.5–5.1)
Sodium: 135 mmol/L (ref 135–145)
Total Bilirubin: 0.4 mg/dL (ref 0.0–1.2)
Total Protein: 6.1 g/dL — ABNORMAL LOW (ref 6.5–8.1)

## 2024-03-09 LAB — URINALYSIS, ROUTINE W REFLEX MICROSCOPIC
Bilirubin Urine: NEGATIVE
Glucose, UA: NEGATIVE mg/dL
Hgb urine dipstick: NEGATIVE
Ketones, ur: NEGATIVE mg/dL
Nitrite: NEGATIVE
Protein, ur: NEGATIVE mg/dL
Specific Gravity, Urine: 1.013 (ref 1.005–1.030)
pH: 6 (ref 5.0–8.0)

## 2024-03-09 LAB — CBC
HCT: 27.7 % — ABNORMAL LOW (ref 36.0–46.0)
Hemoglobin: 9 g/dL — ABNORMAL LOW (ref 12.0–15.0)
MCH: 30.7 pg (ref 26.0–34.0)
MCHC: 32.5 g/dL (ref 30.0–36.0)
MCV: 94.5 fL (ref 80.0–100.0)
Platelets: 289 K/uL (ref 150–400)
RBC: 2.93 MIL/uL — ABNORMAL LOW (ref 3.87–5.11)
RDW: 12.4 % (ref 11.5–15.5)
WBC: 10.5 K/uL (ref 4.0–10.5)
nRBC: 0 % (ref 0.0–0.2)

## 2024-03-09 MED ORDER — HYDROMORPHONE HCL 2 MG PO TABS
1.0000 mg | ORAL_TABLET | Freq: Once | ORAL | Status: AC
  Administered 2024-03-09: 1 mg via ORAL
  Filled 2024-03-09: qty 1

## 2024-03-09 NOTE — MAU Provider Note (Signed)
 History     CSN: [id number]  Arrival date and time: 03/09/24 2024   Event Date/Time   First Provider Initiated Contact with Patient 03/09/24 2124      Chief Complaint  Patient presents with   Abdominal Pain   Abdominal Pain Pertinent negatives include no diarrhea, dysuria, fever, nausea, rash, sore throat or vomiting.   Patient is 23 y.o. [id number] [redacted]w[redacted]d here with complaints of left flank pain that has been constant for about the last week. Patient was seen in the maternity assessment unit on 11/2-at that time there was concern for renal calculus as they saw 2 stones on the right and 1 stone on the left.  This imaging was done via ultrasound patient was unable to stay for admission for pain control and IV hydration due to childcare (she has 2 young children at home).  She reports that she was sent home with Dilaudid  2 mg and she has run out of this and after that point the pain has become constant and severe.  She has also been taking Flomax  daily since being seen on November 2.  She denies fevers chills nausea vomiting.  +FM, denies LOF, VB, contractions, vaginal discharge.   OB History     Gravida  5   Para  2   Term  1   Preterm  1   AB  2   Living  2      SAB  1   IAB  1   Ectopic  0   Multiple  0   Live Births  2           Past Medical History:  Diagnosis Date   ADHD    Anemia    Chlamydia 2022   Depression    GERD (gastroesophageal reflux disease) 10/09/2019   HSV-2 infection    per pt/ seen in ED [facility]   Kidney stones    Seizures (HCC)    x 1 in 5th grade after taking ability. No other episodes.   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   Vaginal pain at introitus  Referred to Pelvic Floor PT      Past Surgical History:  Procedure Laterality Date   CYSTOSCOPY/URETEROSCOPY/HOLMIUM LASER/STENT PLACEMENT Right 08/02/2020   Procedure: CYSTOSCOPY/URETEROSCOPY/HOLMIUM LASER/STENT PLACEMENT;  Surgeon: Penne Knee, MD;  Location: [facility] ORS;  Service: Urology;  Laterality: Right;   DILATION AND CURETTAGE OF UTERUS N/A 08/12/2022   Procedure: DILATATION AND CURETTAGE, ULTRASOUND GUIDED;  Surgeon: Zina Jerilynn LABOR, MD;  Location: [facility] OR;  Service: Gynecology;  Laterality: N/A;   FOOT SURGERY Bilateral    metal screws in toes,2020   TONSILLECTOMY      Family History  Problem Relation Age of Onset   Hepatitis C Mother    Valvular heart disease Mother    Other Father        no contact in over 54yrs   Cancer Maternal Grandmother    Breast cancer Maternal Grandmother    Diabetes Other    Heart attack Other    Asthma Neg Hx    Heart disease Neg Hx    Hypertension Neg Hx     Social History   Tobacco Use   Smoking status: Never   Smokeless tobacco: Never  Vaping Use   Vaping status: Never Used  Substance Use Topics   Alcohol use: Not Currently   Drug use: Not Currently    Types: Marijuana    Comment: last used the end  of September 2022 as of 02/07/2021    Allergies:  Allergies  Allergen Reactions   Abilify [Aripiprazole] Other (See Comments)    Seizures   Metronidazole  Hives and Other (See Comments)    Inguinal area    Medications Prior to Admission  Medication Sig Dispense Refill Last Dose/Taking   acetaminophen  (TYLENOL ) 325 MG tablet Take 650 mg by mouth every 4 (four) hours as needed for mild pain (pain score 1-3) or moderate pain (pain score 4-6).      acetaminophen -caffeine  (EXCEDRIN TENSION HEADACHE) 500-65 MG TABS per tablet Take 2 tablets by mouth 4 (four) times daily as needed (For headaches). 60 tablet 0    cyclobenzaprine  (FLEXERIL ) 10 MG tablet Take 0.5-1 tablets (5-10 mg total) by mouth 3 (three) times daily as needed for muscle spasms. 15 tablet 0    famotidine  (PEPCID ) 40 MG tablet Take 1 tablet (40 mg total) by mouth daily. 90 tablet 3    ferrous sulfate  325 (65 FE) MG tablet Take 1 tablet (325 mg total) by mouth daily. 30 tablet 0    HYDROmorphone  (DILAUDID ) 2  MG tablet Take 0.5 tablets (1 mg total) by mouth every 4 (four) hours as needed for severe pain (pain score 7-10). 30 tablet 0    magic mouthwash (lidocaine , diphenhydrAMINE , alum & mag hydroxide) suspension Swish and spit 5 mLs 3 (three) times daily. (Patient not taking: Reported on 02/28/2024) 360 mL 0    metoCLOPramide  (REGLAN ) 10 MG tablet Take 1 tablet (10 mg total) by mouth every 6 (six) hours as needed (Headcahes). 30 tablet 0    metoCLOPramide  (REGLAN ) 10 MG tablet Take 1 tablet (10 mg total) by mouth every 6 (six) hours. If needed, can place rectally or intravaginally. 30 tablet 0    ondansetron  (ZOFRAN -ODT) 8 MG disintegrating tablet Take 1 tablet (8 mg total) by mouth every 8 (eight) hours as needed for nausea or vomiting. 20 tablet 0    Prenatal Vit-Fe Phos-FA-Omega (VITAFOL  GUMMIES) 3.33-0.333-34.8 MG CHEW Chew 3 tablets by mouth daily. 90 tablet 11    tamsulosin  (FLOMAX ) 0.4 MG CAPS capsule Take 1 capsule (0.4 mg total) by mouth daily. 30 capsule 2    terconazole  (TERAZOL 7 ) 0.4 % vaginal cream Place 1 applicator vaginally at bedtime. Use for seven days 45 g 0    valACYclovir  (VALTREX ) 500 MG tablet Take 1 tablet (500 mg total) by mouth 2 (two) times daily. 60 tablet 4     Review of Systems  Constitutional:  Negative for chills and fever.  HENT:  Negative for congestion and sore throat.   Eyes:  Negative for pain and visual disturbance.  Respiratory:  Negative for cough, chest tightness and shortness of breath.   Cardiovascular:  Negative for chest pain.  Gastrointestinal:  Positive for abdominal pain. Negative for diarrhea, nausea and vomiting.  Endocrine: Negative for cold intolerance and heat intolerance.  Genitourinary:  Negative for dysuria and flank pain.  Musculoskeletal:  Negative for back pain.  Skin:  Negative for rash.  Allergic/Immunologic: Negative for food allergies.  Neurological:  Negative for dizziness and light-headedness.  Psychiatric/Behavioral:  Negative for  agitation.    Physical Exam   Blood pressure 116/62, pulse (!) 106, temperature 98.6 F (37 C), temperature source Oral, resp. rate 17, height 5' 5 (1.651 m), weight 101.7 kg, last menstrual period 06/21/2023, SpO2 99%, not currently breastfeeding.  Physical Exam Vitals and nursing note reviewed.  Constitutional:      General: She is not in acute distress.  Appearance: She is well-developed.     Comments: Pregnant female  HENT:     Head: Normocephalic and atraumatic.  Eyes:     General: No scleral icterus.    Conjunctiva/sclera: Conjunctivae normal.  Cardiovascular:     Rate and Rhythm: Normal rate.  Pulmonary:     Effort: Pulmonary effort is normal.  Chest:     Chest wall: No tenderness.  Abdominal:     Palpations: Abdomen is soft.     Tenderness: There is no abdominal tenderness. There is no guarding or rebound.     Comments: Gravid  Genitourinary:    Vagina: Normal.  Musculoskeletal:        General: Normal range of motion.     Cervical back: Normal range of motion and neck supple.  Skin:    General: Skin is warm and dry.     Findings: No rash.  Neurological:     Mental Status: She is alert and oriented to person, place, and time.     MAU Course  Procedures I reviewed the patient's fetal monitoring.  Baseline HR: 140 Variability:  moderate Accels:present Decels: none A/P: Reactive NST  Reassured regarding fetal status.   Patient was then reevaluated around 12:30 AM, pain had resolved and was desiring to go home.  MDM- Moderate  DDx includes kidney stones.  Less likely infected and obstructed stone as the patient is afebrile.  Other possible differentials include pyelonephritis is less likely given no hydronephrosis seen on CT, round ligament pain though less likely since pain is persistent.  UA notable for trace hemoglobin, rare bacteria with clean-catch consistent with possible UTI and will treat presumptively.  Urinary symptoms may be contributing to  pain.  Patient also found to have worsening anemia hemoglobin 9.0 despite regular p.o. iron supplementation, suspect secondary to progressing pregnancy given no reported bleeding.  Ordered CT scan to help determine location of possible renal calculus as this was not previously done.  CT found one 8 mm stone and 1 to 2 mm stone in left renal calculus, no obstructions or stones in ureter.  Orders Placed This Encounter  Procedures   CT ABDOMEN PELVIS WO CONTRAST   Urinalysis, Routine w reflex microscopic -Urine, Clean Catch   CBC   Comprehensive metabolic panel     Assessment and Plan  Nonobstructive renal calculi: Suspect that the patient very likely passed a previous stone causing her urinary symptoms which resolved in MAU.   -Will continue Flomax  and send prescription for oxycodone  for breakthrough pain if she begins passing the 2 mm stone. - Encouraged hydration  Questionable UTI: Clean catch UA finding trace hemoglobin and rare bacteria, low suspicion for UTI but given patient's history of preterm labor we will treat empirically - Keflex  500 mg 4 times daily for 7 days  Anemia during pregnancy: Hemoglobin down from 9.8 to 9.0 today despite p.o. iron supplementation. - Orders placed for IV iron infusion - Continue p.o. iron supplementation   Suzen Octave Newton 03/09/2024, 9:26 PM

## 2024-03-09 NOTE — MAU Note (Signed)
 MAU Triage Note: Alyssa Howell is a 23 y.o. at [redacted]w[redacted]d here in MAU reporting: h/o kidney stones - recommended admission to hospital two weeks ago, but she couldn't stay due to childcare. They sent her home with medications and instructed her to return with worsening symptoms. She reports constant pain on her left flank that radiates to her abdomen. The pain has been on-going. Has not passed any stones that she knows of. Denies watery LOF or VB. Reports +FM.   Last medications taken last night.   Patient complaint: kidney stones  Pain Score: 8  Pain Location: Flank     Onset of complaint: on-going LMP: Patient's last menstrual period was 06/21/2023 (approximate).  Vitals:   03/09/24 2034  BP: 124/67  Pulse: (!) 108  Resp: 17  Temp: 98.6 F (37 C)  SpO2: 100%    FHT:  Fetal Heart Rate Mode: Doppler Baseline Rate (A): 150 bpm Multiple birth?: No Lab orders placed from triage: UA

## 2024-03-10 ENCOUNTER — Other Ambulatory Visit: Payer: Self-pay

## 2024-03-10 ENCOUNTER — Ambulatory Visit: Attending: Obstetrics

## 2024-03-10 MED ORDER — CEPHALEXIN 500 MG PO CAPS: 500.0000 mg | ORAL_CAPSULE | Freq: Four times a day (QID) | ORAL | 0 refills | Status: AC

## 2024-03-10 MED ORDER — OXYCODONE HCL 5 MG PO TABS: 5.0000 mg | ORAL_TABLET | ORAL | 0 refills | Status: DC | PRN

## 2024-03-10 NOTE — Progress Notes (Signed)
 Presented in MAU, ordered IV iron infusion for worsening anemia to 9.0 today despite reportedly regular taking PO Iron supplementation.

## 2024-03-10 NOTE — Discharge Instructions (Addendum)
 Thank you for coming to the maternity assessment unit today, you were evaluated and found to have nonobstructing kidney stones.  1 stone is 8 mm and the other stone is 2 mm, it is very possible you will pass the 2 mm stone so I have sent you a prescription of oxycodone  to the [facility] on [address] to take in the event that the pain returns.  Please be cautious when taking this medication as it has also been found cause preterm labor if taken too frequently.  Please continue to take your Flomax  and drink a lot of water, try to drink at least a gallon a day. You were also found to have signs of a urinary tract infection, we will treat this because of your risk of preterm labor, I have sent a prescription of Keflex  to your pharmacy, take this 4 times a day for 7 days. Your hemoglobin today is decreased slightly and is now 9.0 which is a sign that your anemia is worsening despite taking oral iron supplementation.  I will put in a referral for you to get the IV iron infusion.

## 2024-03-11 ENCOUNTER — Telehealth: Payer: Self-pay | Admitting: Pharmacy Technician

## 2024-03-11 NOTE — Telephone Encounter (Signed)
 Dr. Nygaard,  Patient will be scheduled as soon as possible.  Auth Submission: NO AUTH NEEDED Site of care: Site of care: [facility] Payer: [facility] Medication & CPT/J Code(s) submitted: Venofer (Iron Sucrose) J1756 Diagnosis Code:  Route of submission (phone, fax, portal):  Phone # Fax # Auth type: Buy/Bill PB Units/visits requested: 5 DOSES Reference number:  Approval from: 03/11/24 to 04/23/24

## 2024-03-13 ENCOUNTER — Ambulatory Visit

## 2024-03-13 ENCOUNTER — Inpatient Hospital Stay (HOSPITAL_COMMUNITY)
Admission: AD | Admit: 2024-03-13 | Discharge: 2024-03-13 | Disposition: A | Discharge status: Home / Self Care | Payer: Self-pay | Attending: Obstetrics & Gynecology | Admitting: Obstetrics & Gynecology

## 2024-03-13 ENCOUNTER — Other Ambulatory Visit (HOSPITAL_COMMUNITY)
Admission: RE | Admit: 2024-03-13 | Discharge: 2024-03-13 | Disposition: A | Source: Ambulatory Visit | Attending: Physician Assistant | Admitting: Physician Assistant

## 2024-03-13 ENCOUNTER — Ambulatory Visit: Attending: Obstetrics and Gynecology | Admitting: Obstetrics

## 2024-03-13 ENCOUNTER — Ambulatory Visit (INDEPENDENT_AMBULATORY_CARE_PROVIDER_SITE_OTHER): Admitting: Physician Assistant

## 2024-03-13 VITALS — BP 144/71

## 2024-03-13 VITALS — BP 126/74 | HR 107 | Wt 222.9 lb

## 2024-03-13 DIAGNOSIS — W19XXXD Unspecified fall, subsequent encounter: Secondary | ICD-10-CM

## 2024-03-13 DIAGNOSIS — Z6834 Body mass index (BMI) 34.0-34.9, adult: Secondary | ICD-10-CM | POA: Diagnosis not present

## 2024-03-13 DIAGNOSIS — O09899 Supervision of other high risk pregnancies, unspecified trimester: Secondary | ICD-10-CM

## 2024-03-13 DIAGNOSIS — Z79624 Long term (current) use of inhibitors of nucleotide synthesis: Secondary | ICD-10-CM | POA: Diagnosis not present

## 2024-03-13 DIAGNOSIS — Z3689 Encounter for other specified antenatal screening: Secondary | ICD-10-CM | POA: Insufficient documentation

## 2024-03-13 DIAGNOSIS — Z3A32 32 weeks gestation of pregnancy: Secondary | ICD-10-CM | POA: Insufficient documentation

## 2024-03-13 DIAGNOSIS — O09293 Supervision of pregnancy with other poor reproductive or obstetric history, third trimester: Secondary | ICD-10-CM | POA: Diagnosis not present

## 2024-03-13 DIAGNOSIS — O26893 Other specified pregnancy related conditions, third trimester: Secondary | ICD-10-CM | POA: Insufficient documentation

## 2024-03-13 DIAGNOSIS — O09292 Supervision of pregnancy with other poor reproductive or obstetric history, second trimester: Secondary | ICD-10-CM | POA: Diagnosis not present

## 2024-03-13 DIAGNOSIS — O0990 Supervision of high risk pregnancy, unspecified, unspecified trimester: Secondary | ICD-10-CM

## 2024-03-13 DIAGNOSIS — O99891 Other specified diseases and conditions complicating pregnancy: Secondary | ICD-10-CM

## 2024-03-13 DIAGNOSIS — O99013 Anemia complicating pregnancy, third trimester: Secondary | ICD-10-CM | POA: Diagnosis not present

## 2024-03-13 DIAGNOSIS — N200 Calculus of kidney: Secondary | ICD-10-CM | POA: Insufficient documentation

## 2024-03-13 DIAGNOSIS — O9A213 Injury, poisoning and certain other consequences of external causes complicating pregnancy, third trimester: Secondary | ICD-10-CM

## 2024-03-13 DIAGNOSIS — O26833 Pregnancy related renal disease, third trimester: Secondary | ICD-10-CM | POA: Diagnosis not present

## 2024-03-13 DIAGNOSIS — A6004 Herpesviral vulvovaginitis: Secondary | ICD-10-CM | POA: Diagnosis not present

## 2024-03-13 DIAGNOSIS — O99323 Drug use complicating pregnancy, third trimester: Secondary | ICD-10-CM | POA: Diagnosis not present

## 2024-03-13 DIAGNOSIS — F1290 Cannabis use, unspecified, uncomplicated: Secondary | ICD-10-CM

## 2024-03-13 DIAGNOSIS — O99213 Obesity complicating pregnancy, third trimester: Secondary | ICD-10-CM

## 2024-03-13 DIAGNOSIS — O99210 Obesity complicating pregnancy, unspecified trimester: Secondary | ICD-10-CM

## 2024-03-13 DIAGNOSIS — E669 Obesity, unspecified: Secondary | ICD-10-CM

## 2024-03-13 DIAGNOSIS — Z362 Encounter for other antenatal screening follow-up: Secondary | ICD-10-CM

## 2024-03-13 LAB — BASIC METABOLIC PANEL WITH GFR
Anion gap: 8 (ref 5–15)
BUN: 9 mg/dL (ref 6–20)
CO2: 20 mmol/L — ABNORMAL LOW (ref 22–32)
Calcium: 8.4 mg/dL — ABNORMAL LOW (ref 8.9–10.3)
Chloride: 106 mmol/L (ref 98–111)
Creatinine, Ser: 0.6 mg/dL (ref 0.44–1.00)
GFR, Estimated: >60 mL/min (ref >60)
Glucose, Bld: 131 mg/dL — ABNORMAL HIGH (ref 70–99)
Potassium: 3.5 mmol/L (ref 3.5–5.1)
Sodium: 134 mmol/L — ABNORMAL LOW (ref 135–145)

## 2024-03-13 LAB — CBC
HCT: 28.4 % — ABNORMAL LOW (ref 36.0–46.0)
Hemoglobin: 9.2 g/dL — ABNORMAL LOW (ref 12.0–15.0)
MCH: 30.5 pg (ref 26.0–34.0)
MCHC: 32.4 g/dL (ref 30.0–36.0)
MCV: 94 fL (ref 80.0–100.0)
Platelets: 306 K/uL (ref 150–400)
RBC: 3.02 MIL/uL — ABNORMAL LOW (ref 3.87–5.11)
RDW: 12.6 % (ref 11.5–15.5)
WBC: 10.1 K/uL (ref 4.0–10.5)
nRBC: 0 % (ref 0.0–0.2)

## 2024-03-13 LAB — URINALYSIS, ROUTINE W REFLEX MICROSCOPIC
Bilirubin Urine: NEGATIVE
Glucose, UA: NEGATIVE mg/dL
Ketones, ur: NEGATIVE mg/dL
Nitrite: NEGATIVE
Protein, ur: NEGATIVE mg/dL
RBC / HPF: >50 RBC/hpf (ref 0–5)
Specific Gravity, Urine: 1.019 (ref 1.005–1.030)
pH: 6 (ref 5.0–8.0)

## 2024-03-13 MED ORDER — TAMSULOSIN HCL 0.4 MG PO CAPS: 0.4000 mg | ORAL_CAPSULE | Freq: Every day | ORAL | 2 refills | Status: AC

## 2024-03-13 MED ORDER — HYDROMORPHONE HCL 2 MG PO TABS
2.0000 mg | ORAL_TABLET | Freq: Once | ORAL | Status: AC
  Administered 2024-03-13: 2 mg via ORAL
  Filled 2024-03-13: qty 1

## 2024-03-13 MED ORDER — HYDROMORPHONE HCL 1 MG/ML IJ SOLN: 2.0000 mg | Freq: Once | INTRAMUSCULAR | Status: DC

## 2024-03-13 MED ORDER — ACETAMINOPHEN 500 MG PO TABS
1000.0000 mg | ORAL_TABLET | Freq: Once | ORAL | Status: AC
  Administered 2024-03-13: 1000 mg via ORAL
  Filled 2024-03-13: qty 2

## 2024-03-13 MED ORDER — HYDROMORPHONE HCL 1 MG/ML IJ SOLN
1.0000 mg | Freq: Once | INTRAMUSCULAR | Status: AC
  Administered 2024-03-13: 1 mg via INTRAVENOUS
  Filled 2024-03-13: qty 1

## 2024-03-13 MED ORDER — ONDANSETRON 4 MG PO TBDP
8.0000 mg | ORAL_TABLET | Freq: Once | ORAL | Status: AC
  Administered 2024-03-13: 8 mg via ORAL
  Filled 2024-03-13: qty 2

## 2024-03-13 MED ORDER — LACTATED RINGERS IV BOLUS
1000.0000 mL | Freq: Once | INTRAVENOUS | Status: AC
  Administered 2024-03-13: 1000 mL via INTRAVENOUS

## 2024-03-13 MED ORDER — FAMOTIDINE 20 MG PO TABS
20.0000 mg | ORAL_TABLET | Freq: Once | ORAL | Status: AC
  Administered 2024-03-13: 20 mg via ORAL
  Filled 2024-03-13: qty 1

## 2024-03-13 NOTE — Discharge Instructions (Addendum)
 KIDNEY STONES: Pregnancy may increase the chance of developing kidney stones. During pregnancy, more calcium is excreted in urine which can increase the likelihood of developing kidney stones. Additionally, urine pH increases (becomes less acidic) which increased the chance of stones. Increased progesterone levels can cause urinary stasis, and decreased fluid intake also contribute to kidney stone formation. MANAGEMENT Increase fluid intake. Aim for 100 ounces of water each day to help flush the kidney stones out. Tamsulosin  (Flomax ). This medicine helps to relax the ureters (tubes from the kidneys to the bladder) to allow stones to pass easier. Pain management. Baseline control with Tylenol  1000 mg every 6 hours on a schedule. Use the oxycodone  for breakthrough pain not controlled with Tylenol . Heating pads or warm showers can also be helpful. Time. It can take 2-4 weeks for stones to pass completely. Decrease your salt (sodium) intake. Eat at home with homecooked meals more and less at restaurants. The food you have at home, check the label for the sodium content. You should have less than 2000 mg total each day.  Unfortunately, options for pain management during pregnancy are limited to Tylenol  and medications like oxycodone  which can be sedating. Drinking a ton of water will help the stone to pass, but until that time Tylenol  and oxycodone  for pain management will be the only options. Dr. Ileana said that depending on pain control and urology recommendations, we will consider delivery around 37-39 weeks.

## 2024-03-13 NOTE — MAU Provider Note (Signed)
 Chief Complaint:  Back Pain and Contractions   HPI   None     Alyssa Howell is a 23 y.o. [id number] at [redacted]w[redacted]d who presents to maternity admissions reporting ongoing left flank pain due to known kidney stone. She also notes occasional contractions. Denies vaginal bleeding, leaking of fluid, regular or frequent contractions, headache, blurry vision, or RUQ pain. She has been taking Flomax , oxycodone , and Tylenol  for pain management and to facilitate the passage of kidney stones. She reports struggling to void the ~2 hours before arrival at MAU. She drinks 5-6 Yeti thermoses of water each day, plus one soda typically.  Pregnancy Course: Receives care at [facility] for [facility] . Prenatal records reviewed. Referred to urology at [facility] appointment today.  Past Medical History:  Diagnosis Date   ADHD    Anemia    Chlamydia 2022   Depression    GERD (gastroesophageal reflux disease) 10/09/2019   HSV-2 infection    per pt/ seen in ED [facility]   Kidney stones    Seizures (HCC)    x 1 in 5th grade after taking ability. No other episodes.   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   Vaginal pain at introitus  Referred to Pelvic Floor PT     OB History  Gravida Para Term Preterm AB Living  5 2 1 1 2 2   SAB IAB Ectopic Multiple Live Births  1 1 0 0 2    # Outcome Date GA Lbr Len/2nd Weight Sex Type Anes PTL Lv  5 Current           4 IAB 01/2023          3 Preterm 08/05/22 [redacted]w[redacted]d 26:50 / 00:32 2590 g M Vag-Spont EPI  LIV  2 Term 03/2021 [redacted]w[redacted]d  3585 g M Vag-Spont   LIV  1 SAB            Past Surgical History:  Procedure Laterality Date   CYSTOSCOPY/URETEROSCOPY/HOLMIUM LASER/STENT PLACEMENT Right 08/02/2020   Procedure: CYSTOSCOPY/URETEROSCOPY/HOLMIUM LASER/STENT PLACEMENT;  Surgeon: Penne Knee, MD;  Location: [facility] ORS;  Service: Urology;  Laterality: Right;   DILATION AND CURETTAGE OF UTERUS N/A 08/12/2022   Procedure: DILATATION AND CURETTAGE,  ULTRASOUND GUIDED;  Surgeon: Zina Jerilynn LABOR, MD;  Location: [facility] OR;  Service: Gynecology;  Laterality: N/A;   FOOT SURGERY Bilateral    metal screws in toes,2020   TONSILLECTOMY     Family History  Problem Relation Age of Onset   Hepatitis C Mother    Valvular heart disease Mother    Other Father        no contact in over 69yrs   Cancer Maternal Grandmother    Breast cancer Maternal Grandmother    Diabetes Other    Heart attack Other    Asthma Neg Hx    Heart disease Neg Hx    Hypertension Neg Hx    Social History   Tobacco Use   Smoking status: Never   Smokeless tobacco: Never  Vaping Use   Vaping status: Never Used  Substance Use Topics   Alcohol use: Not Currently   Drug use: Not Currently    Types: Marijuana    Comment: last used the end of September 2022 as of 02/07/2021   Allergies  Allergen Reactions   Abilify [Aripiprazole] Other (See Comments)    Seizures   Metronidazole  Hives and Other (See Comments)    Inguinal area   Medications Prior to Admission  Medication  Sig Dispense Refill Last Dose/Taking   acetaminophen  (TYLENOL ) 325 MG tablet Take 650 mg by mouth every 4 (four) hours as needed for mild pain (pain score 1-3) or moderate pain (pain score 4-6).   Past Week   cephALEXin  (KEFLEX ) 500 MG capsule Take 1 capsule (500 mg total) by mouth 4 (four) times daily for 7 days. 28 capsule 0 03/13/2024   cyclobenzaprine  (FLEXERIL ) 10 MG tablet Take 0.5-1 tablets (5-10 mg total) by mouth 3 (three) times daily as needed for muscle spasms. 15 tablet 0 03/12/2024   famotidine  (PEPCID ) 40 MG tablet Take 1 tablet (40 mg total) by mouth daily. 90 tablet 3 Past Week   ondansetron  (ZOFRAN -ODT) 8 MG disintegrating tablet Take 1 tablet (8 mg total) by mouth every 8 (eight) hours as needed for nausea or vomiting. 20 tablet 0 03/13/2024   oxyCODONE  (ROXICODONE ) 5 MG immediate release tablet Take 1 tablet (5 mg total) by mouth every 4 (four) hours as needed for up to 30 doses for  severe pain (pain score 7-10), moderate pain (pain score 4-6) or breakthrough pain (Take if tylenol  is not helping pain). 20 tablet 0 03/13/2024   Prenatal Vit-Fe Phos-FA-Omega (VITAFOL  GUMMIES) 3.33-0.333-34.8 MG CHEW Chew 3 tablets by mouth daily. 90 tablet 11 03/12/2024   tamsulosin  (FLOMAX ) 0.4 MG CAPS capsule Take 1 capsule (0.4 mg total) by mouth daily. 30 capsule 2 03/13/2024   valACYclovir  (VALTREX ) 500 MG tablet Take 1 tablet (500 mg total) by mouth 2 (two) times daily. 60 tablet 4 03/12/2024   acetaminophen -caffeine  (EXCEDRIN TENSION HEADACHE) 500-65 MG TABS per tablet Take 2 tablets by mouth 4 (four) times daily as needed (For headaches). 60 tablet 0 More than a month   ferrous sulfate  325 (65 FE) MG tablet Take 1 tablet (325 mg total) by mouth daily. 30 tablet 0 More than a month   metoCLOPramide  (REGLAN ) 10 MG tablet Take 1 tablet (10 mg total) by mouth every 6 (six) hours as needed (Headcahes). 30 tablet 0 More than a month   metoCLOPramide  (REGLAN ) 10 MG tablet Take 1 tablet (10 mg total) by mouth every 6 (six) hours. If needed, can place rectally or intravaginally. (Patient not taking: Reported on 03/13/2024) 30 tablet 0    terconazole  (TERAZOL 7 ) 0.4 % vaginal cream Place 1 applicator vaginally at bedtime. Use for seven days 45 g 0 More than a month    I have reviewed patient's Past Medical Hx, Surgical Hx, Family Hx, Social Hx, medications and allergies.   ROS  Pertinent items noted in HPI and remainder of comprehensive ROS otherwise negative.   PHYSICAL EXAM  Patient Vitals for the past 24 hrs:  BP Temp Temp src Pulse Resp SpO2  03/13/24 1635 123/70 -- -- (!) 108 16 --  03/13/24 1530 -- -- -- -- -- 99 %  03/13/24 1529 (!) 96/54 98.4 F (36.9 C) Oral (!) 115 16 99 %    Constitutional: Well-developed, well-nourished female in no acute distress.  HEENT: atraumatic, normocephalic. Neck has normal ROM. EOM intact. Cardiovascular: normal rate & rhythm, warm and  well-perfused Respiratory: normal effort, no problems with respiration noted GI: Abd soft, non-tender, non-distended MSK: Extremities nontender, no edema, normal ROM Skin: warm and dry. Acyanotic, no jaundice or pallor. Neurologic: Alert and oriented x 4. No abnormal coordination. Psychiatric: Normal mood. Speech not slurred, not rapid/pressured. Patient is cooperative. GU: left CVA tenderness and rebound  Fetal Tracing: Baseline FHR: 135 per minute Fetal heart variability: moderate Fetal Heart Rate accelerations:  yes Fetal Heart Rate decelerations: none Fetal Non-stress Test: Category I (reactive) Toco: no uterine contractions or uterine irritability   Labs: Results for orders placed or performed during the hospital encounter of 03/13/24 (from the past 24 hours)  CBC     Status: Abnormal   Collection Time: 03/13/24  4:01 PM  Result Value Ref Range   WBC 10.1 4.0 - 10.5 K/uL   RBC 3.02 (L) 3.87 - 5.11 MIL/uL   Hemoglobin 9.2 (L) 12.0 - 15.0 g/dL   HCT 71.5 (L) 63.9 - 53.9 %   MCV 94.0 80.0 - 100.0 fL   MCH 30.5 26.0 - 34.0 pg   MCHC 32.4 30.0 - 36.0 g/dL   RDW 87.3 88.4 - 84.4 %   Platelets 306 150 - 400 K/uL   nRBC 0.0 0.0 - 0.2 %  Basic metabolic panel     Status: Abnormal   Collection Time: 03/13/24  4:01 PM  Result Value Ref Range   Sodium 134 (L) 135 - 145 mmol/L   Potassium 3.5 3.5 - 5.1 mmol/L   Chloride 106 98 - 111 mmol/L   CO2 20 (L) 22 - 32 mmol/L   Glucose, Bld 131 (H) 70 - 99 mg/dL   BUN 9 6 - 20 mg/dL   Creatinine, Ser 9.39 0.44 - 1.00 mg/dL   Calcium 8.4 (L) 8.9 - 10.3 mg/dL   GFR, Estimated >39 >39 mL/min   Anion gap 8 5 - 15    Imaging:  US  MFM OB FOLLOW UP Result Date: 03/13/2024 ----------------------------------------------------------------------  OBSTETRICS REPORT                       (Signed Final 03/13/2024 12:52 pm) ---------------------------------------------------------------------- Patient Info  ID #:       [id number]                           D.O.B.:  [birth year] (23 yrs)(F)  Name:       Humna Viney                  Visit Date: 03/13/2024 11:22 am ---------------------------------------------------------------------- Performed By  Attending:        Steffan Keys MD         Ref. Address:     [address]                                                             [place], [place]                                                             [address]  Performed By:     Comer Harrow  [facility]  Referred By:      [facility] ---------------------------------------------------------------------- Orders  #  Description                           Code        Ordered By  1  US  MFM OB FOLLOW UP                   76816.01    RAVI [facility] ----------------------------------------------------------------------  #  Order #                     Accession #                Episode #  1  [id number]                   [id number]                 [id number] ---------------------------------------------------------------------- Indications  Obesity complicating pregnancy, third          O99.213  trimester (BMI 33)  Drug use complicating pregnancy, third         O99.323  trimester  Poor obstetric history: Previous preterm       O09.219  delivery, antepartum  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 Cord Insertion:      Previously seen  Amniotic Fluid  AFI FV:      Within normal limits  AFI Sum(cm)     %Tile  Largest Pocket(cm)  18.29           68          5.62  RUQ(cm)       RLQ(cm)       LUQ(cm)        LLQ(cm)  3.65          5.62          5.14           3.88 ---------------------------------------------------------------------- Biometry  BPD:      83.8  mm     G. Age:  33w 5d         82  %    CI:        78.44   %    70 - 86                                                          FL/HC:      18.9   %    19.1 - 21.3  HC:      299.3  mm     G. Age:  33w 1d         35  %    HC/AC:      1.01        0.96 - 1.17  AC:      295.7  mm     G. Age:  33w 4d         83  %    FL/BPD:     67.7   %    71 - 87  FL:       56.7  mm     G. Age:  29w 5d        1.4  %    FL/AC:      19.2   %    20 - 24  Est. FW:    1975  gm      4 lb 6 oz     44  %  Est. FW at 39 Wks:       3371  gm     7 lb 7 oz ---------------------------------------------------------------------- OB History  Gravidity:    4         Term:   1        Prem:   1        SAB:   0  TOP:          1       Ectopic:  0        Living: 2 ---------------------------------------------------------------------- Gestational Age  LMP:           38w 0d        Date:  06/21/23                  EDD:   03/27/24  U/S Today:     32w 4d                                        EDD:   05/04/24  Best:          32w 2d     Det. By:  Early Ultrasound  EDD:   05/06/24                                      (09/18/23) ---------------------------------------------------------------------- Targeted Anatomy  Central Nervous System  Calvarium/Cranial V.:  Appears normal         Cereb./Vermis:          Previously seen  Cavum:                  Previously seen        [place] [employer]:         Previously seen  Lateral Ventricles:    Previously seen        Midline Falx:           Previously seen  Choroid Plexus:        Previously seen  Spine  Cervical:              Previously seen        Sacral:                 Appears normal  Thoracic:              Previously seen        Shape/Curvature:        Previously seen  Lumbar:                Previously seen  Head/Neck  Lips:                  Previously seen        Profile:                Previously seen  Neck:                  Previously seen        Orbits/Eyes:            Previously seen  Nuchal Fold:           Previously seen        Mandible:               Previously seen  Nasal Bone:            Previously seen        Maxilla:                Previously seen  Thorax  4 Chamber View:        Appears normal         Interventr. Septum:     Previously seen  Cardiac Rhythm:        Normal                 Cardiac Axis:           Normal  Cardiac Situs:         Previously seen        Diaphragm:              Appears normal  Rt Outflow Tract:      Previously seen        3 Vessel View:          Previously seen  Lt Outflow Tract:      Previously seen        3 V Trachea View:       Previously seen  Aortic Arch:  Previously seen        IVC:                    Previously seen  Ductal Arch:           Previously seen        Crossing:               Previously seen  SVC:                   Previously seen  Abdomen  Ventral Wall:          Previously seen        Lt Kidney:              Appears normal  Cord Insertion:        Previously seen        Rt Kidney:              Appears normal  Situs:                 Previously seen        Bladder:                Appears normal  Stomach:               Appears normal  Extremities  Lt Humerus:            Previously seen        Lt Femur:               Previously seen  Rt Humerus:            Previously seen        Rt Femur:               Previously seen  Lt Forearm:            Previously seen         Lt Lower Leg:           Previously seen  Rt Forearm:            Previously seen        Rt Lower Leg:           Previously seen  Lt Hand:               Previously seen        Lt Foot:                Previously seen  Rt Hand:               Previously seen        Rt Foot:                Previously seen  Other  Umbilical Cord:        Previously seen        Genitalia:              Female-nml  Comment:     Fetal anatomic survey complete. ---------------------------------------------------------------------- Cervix Uterus Adnexa  Cervix  Not visualized (advanced GA >24wks) ---------------------------------------------------------------------- Comments  Sonographic findings  Single intrauterine pregnancy at 32w 2d.  Fetal cardiac activity:  Observed and appears normal.  Presentation: Cephalic.  Fetal biometry shows the estimated fetal weight of 4 lb 6 oz,  1975g (44%).  Amniotic fluid volume: Within normal limits. AFI: 18.29cm.  MVP: 5.62 cm.  Placenta: Posterior.  There are limitations  of prenatal ultrasound such as the  inability to detect certain abnormalities due to poor  visualization. Various factors such as fetal position,  gestational age and maternal body habitus may increase the  difficulty in visualizing the fetal anatomy.  Recommendations  - See Epic note for assessment and plan of care. Any  referring office that does not utilize Epic will recieve a copy of  today's consult note via fax. Please contact our office with  any concerns. ----------------------------------------------------------------------                   Steffan Keys, MD Electronically Signed Final Report   03/13/2024 12:52 pm ----------------------------------------------------------------------   MDM & MAU COURSE  MDM: Moderate  MAU Course: -Mild hypotension. Afebrile. -IV fluids, Tylenol , and Dilaudid  for known kidney stone. -CBC to ensure no new infection. -BMP for renal function given difficulty voiding. -CBC within normal  limits. Stable WBC and afebrile, no indication of acute infection. -CMP within normal limits. Renal function normal. -Discussed with Dr. Fredirick, agrees with current management plan. Discussed that management is limited due to pregnancy, and that medications like oxycodone  are sedating. Recommend stopping all soda intake.   Orders Placed This Encounter  Procedures   CBC   Basic metabolic panel   Discharge patient   Meds ordered this encounter  Medications   lactated ringers  bolus 1,000 mL   acetaminophen  (TYLENOL ) tablet 1,000 mg   HYDROmorphone  (DILAUDID ) injection 1 mg    ASSESSMENT   1. Recurrent nephrolithiasis   2. [redacted] weeks gestation of pregnancy     PLAN  Discharge home in stable condition with return precautions.  Follow up with urology outpatient. Discussed goal of 100 ounces of fluid intake daily. Continue Flomax  5 mg daily. Recommend Tylenol  1000 mg every 6 hours for pain with oxycodone  5 mg for breakthrough pain. Discussed that pain management is limited during pregnancy. Stop soda intake, and start following dietary guidelines to prevent kidney stones (information in AVS).    Allergies as of 03/13/2024       Reactions   Abilify [aripiprazole] Other (See Comments)   Seizures   Metronidazole  Hives, Other (See Comments)   Inguinal area        Medication List     TAKE these medications    acetaminophen  325 MG tablet Commonly known as: TYLENOL  Take 650 mg by mouth every 4 (four) hours as needed for mild pain (pain score 1-3) or moderate pain (pain score 4-6).   acetaminophen -caffeine  500-65 MG Tabs per tablet Commonly known as: EXCEDRIN TENSION HEADACHE Take 2 tablets by mouth 4 (four) times daily as needed (For headaches).   cephALEXin  500 MG capsule Commonly known as: KEFLEX  Take 1 capsule (500 mg total) by mouth 4 (four) times daily for 7 days.   cyclobenzaprine  10 MG tablet Commonly known as: FLEXERIL  Take 0.5-1 tablets (5-10 mg total) by mouth 3  (three) times daily as needed for muscle spasms.   famotidine  40 MG tablet Commonly known as: PEPCID  Take 1 tablet (40 mg total) by mouth daily.   ferrous sulfate  325 (65 FE) MG tablet Take 1 tablet (325 mg total) by mouth daily.   metoCLOPramide  10 MG tablet Commonly known as: REGLAN  Take 1 tablet (10 mg total) by mouth every 6 (six) hours as needed (Headcahes). What changed: Another medication with the same name was removed. Continue taking this medication, and follow the directions you see here.   ondansetron  8 MG disintegrating tablet Commonly known as: ZOFRAN -ODT Take 1  tablet (8 mg total) by mouth every 8 (eight) hours as needed for nausea or vomiting.   oxyCODONE  5 MG immediate release tablet Commonly known as: Roxicodone  Take 1 tablet (5 mg total) by mouth every 4 (four) hours as needed for up to 30 doses for severe pain (pain score 7-10), moderate pain (pain score 4-6) or breakthrough pain (Take if tylenol  is not helping pain).   tamsulosin  0.4 MG Caps capsule Commonly known as: FLOMAX  Take 1 capsule (0.4 mg total) by mouth daily.   terconazole  0.4 % vaginal cream Commonly known as: TERAZOL 7  Place 1 applicator vaginally at bedtime. Use for seven days   valACYclovir  500 MG tablet Commonly known as: Valtrex  Take 1 tablet (500 mg total) by mouth 2 (two) times daily.   Vitafol  Gummies 3.33-0.333-34.8 MG Chew Chew 3 tablets by mouth daily.        Joesph DELENA Sear, PA

## 2024-03-13 NOTE — Progress Notes (Signed)
 Pt presents for rob. Pt has no questions or concerns at this time.

## 2024-03-13 NOTE — Addendum Note (Signed)
 Addended by: MARCINE GAINS on: 03/13/2024 01:40 PM   Modules accepted: Orders

## 2024-03-13 NOTE — MAU Note (Signed)
 Patient vomited x 1 at 1953, PO dilaudid  and PO Pepcid  given at 1943. Provider made aware, zofran  ordered and given. Patient agreeable to take pain medication at home if pain does is not relieved.

## 2024-03-13 NOTE — Progress Notes (Signed)
 MFM Consult Note  Alyssa Howell is currently at [redacted]w[redacted]d. She has been followed due to maternal obesity with a BMI of 33 and short interval pregnancy.    Her pregnancy has been complicated by kidney stones.  She is taking Flomax  and oxycodone  for pain relief.  Sonographic findings Single intrauterine pregnancy at 32w 2d.  Fetal cardiac activity:  Observed and appears normal. Presentation: Cephalic. Fetal biometry shows the estimated fetal weight of 4 lb 6 oz,  1975g (44%). Amniotic fluid volume: Within normal limits. AFI: 18.29cm.  MVP: 5.62 cm. Placenta: Posterior.  Kidney stones in pregnancy  The patient was advised to continue taking Flomax  and oxycodone  as recommended for treatment of her kidney stones.    Should her kidney stones continue to bother her, delivery may be considered at around 39 weeks.    However, should the pain related to her kidney stones be unbearable, delivery may be considered at around 37 weeks to enable her to receive definitive treatment for the kidney stones which would have been otherwise deferred due to her pregnancy.    No further exams were scheduled in our office.  The patient stated that all of her questions were answered.   A total of 20 minutes was spent counseling and coordinating the care for this patient.  Greater than 50% of the time was spent in direct face-to-face contact.

## 2024-03-13 NOTE — Progress Notes (Signed)
 PRENATAL VISIT NOTE  Subjective:  Alyssa Howell is a 23 y.o. H4E8877 at [redacted]w[redacted]d being seen today for ongoing prenatal care.  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  Patient reports continued pain from nephrolithiasis. She is taking Flomax  and oxycodone  5. Was unable to stay in hospital for IV fluids due to no child care. She was also treated empirically for UTI with keflex  500 mg QID x 7d.  Contractions: Irritability. Vag. Bleeding: None.  Movement: Present. Denies leaking of fluid.   The following portions of the patient's history were reviewed and updated as appropriate: allergies, current medications, past family history, past medical history, past social history, past surgical history and problem list.   Objective:   Vitals:   03/13/24 0947  BP: 126/74  Pulse: (!) 107  Weight: 222 lb 14.4 oz (101.1 kg)    Fetal Status:  Fetal Heart Rate (bpm): 147   Movement: Present    General: Alert, oriented and cooperative. Patient is in no acute distress.  Skin: Skin is warm and dry. No rash noted.   Cardiovascular: Normal heart rate noted  Respiratory: Normal respiratory effort, no problems with respiration noted  Abdomen: Soft, gravid, appropriate for gestational age.  Pain/Pressure: Present     Pelvic: Cervical exam deferred        Extremities: Normal range of motion.  Edema: None  Mental Status: Normal mood and affect. Normal behavior. Normal judgment and thought content.      02/04/2024    9:53 AM 01/31/2024    1:58 PM 09/18/2023    5:43 PM  Depression screen  PHQ 2/9  Decreased Interest 0 0 0  Down, Depressed, Hopeless 0 0 0  PHQ - 2 Score 0 0 0  Altered sleeping 3 3 2   Tired, decreased energy 3 3 1   Change in appetite 0 0 0  Feeling bad or failure about yourself  0 0 0  Trouble concentrating 0 0 0  Moving slowly or fidgety/restless 0 0 0  Suicidal thoughts 0 0 0  PHQ-9 Score 6  6  3    Difficult doing work/chores  Not difficult at all      Data saved with a previous flowsheet row definition        09/18/2023    5:43 PM 06/15/2022    8:58 AM 05/18/2022    9:36 AM  GAD 7 : Generalized Anxiety Score  Nervous, Anxious, on Edge 0 0 0  Control/stop worrying 0 0 0  Worry too much - different things 0 0 0  Trouble relaxing 0 0 1  Restless 1 0 3  Easily annoyed or irritable 0 0 1  Afraid - awful might happen 0 0 0  Total GAD 7 Score 1 0 5    Assessment and Plan:  Pregnancy: [id number] at [redacted]w[redacted]d  1. Supervision of high risk pregnancy, antepartum (Primary) Patient doing well, feeling regular fetal movement BP, FHR, FH appropriate   2. [redacted] weeks gestation of pregnancy Anticipatory guidance about next visits/weeks of pregnancy given.   3. History of preterm delivery, currently pregnant No ssxs PTL  4. BMI 34.0-34.9,adult  5. Anemia in pregnancy, third trimester 03/09/24 hgb 9.0 Iron  infusion  appointments upcoming  6. Herpes simplex vulvovaginitis Valtrex  500 mg BID at 36 weeks  7. Nephrolithiasis Continue flomax  and oxy 5 - Ambulatory referral to Urology   Preterm labor symptoms and general obstetric precautions including but not limited to vaginal bleeding, contractions, leaking of fluid and fetal movement were reviewed in detail with the patient.  Please refer to After Visit Summary for other counseling recommendations.   No follow-ups on file.  Future Appointments  Date Time Provider Department Center  03/13/2024 11:15 AM [facility] PROVIDER 1 [facility]  03/13/2024 11:30 AM [facility] US2 [facility]  03/14/2024  8:30 AM  [facility] 6 [facility] None  03/17/2024 11:45 AM [facility] 1 [facility] None  03/19/2024 10:00 AM [facility] 1 [facility] None  03/24/2024 11:30 AM [facility] 2 [facility] None  03/26/2024 11:30 AM [facility] 1 [facility] None  03/26/2024  2:10 PM Trudy Leeroy NOVAK, MD [facility] None    Jorene FORBES Moats, PA-C

## 2024-03-13 NOTE — MAU Note (Addendum)
 Alyssa Howell is a 23 y.o. at [redacted]w[redacted]d here in MAU reporting: left side flank back pain and occ contractions.rebound pain on left side. She reports +FMs, Denies LOF, VB, regular contractions, blurry vision, headaches, peripheral edema, or RUQ pain. Pt reports unable to void last couple of hours.   LMP: na Onset of complaint: 1330 Pain score: 10/10 Vitals:   03/13/24 1529 03/13/24 1530  BP: (!) 96/54   Pulse: (!) 115   Resp: 16   Temp: 98.4 F (36.9 C)   SpO2: 99% 99%     FHT: 140  Lab orders placed from triage:

## 2024-03-14 ENCOUNTER — Ambulatory Visit (INDEPENDENT_AMBULATORY_CARE_PROVIDER_SITE_OTHER)

## 2024-03-14 ENCOUNTER — Encounter: Payer: Self-pay | Admitting: Obstetrics

## 2024-03-14 VITALS — BP 118/75 | HR 99 | Temp 98.1°F | Resp 16 | Ht 65.0 in | Wt 227.2 lb

## 2024-03-14 DIAGNOSIS — Z3A32 32 weeks gestation of pregnancy: Secondary | ICD-10-CM | POA: Diagnosis not present

## 2024-03-14 DIAGNOSIS — O99013 Anemia complicating pregnancy, third trimester: Secondary | ICD-10-CM

## 2024-03-14 LAB — CERVICOVAGINAL ANCILLARY ONLY
Bacterial Vaginitis (gardnerella): NEGATIVE
Candida Glabrata: NEGATIVE
Candida Vaginitis: NEGATIVE
Chlamydia: POSITIVE — AB
Comment: NEGATIVE
Comment: NEGATIVE
Comment: NEGATIVE
Comment: NEGATIVE
Comment: NEGATIVE
Comment: NORMAL
Neisseria Gonorrhea: NEGATIVE
Trichomonas: NEGATIVE

## 2024-03-14 MED ORDER — SODIUM CHLORIDE 0.9 % IV SOLN
200.0000 mg | Freq: Once | INTRAVENOUS | Status: AC
  Administered 2024-03-14: 200 mg via INTRAVENOUS
  Filled 2024-03-14: qty 10

## 2024-03-14 NOTE — Progress Notes (Signed)
 Diagnosis: Acute Anemia  Provider:  Mannam, Praveen MD  Procedure: IV Infusion  IV Type: Peripheral, IV Location: R Forearm  Venofer  (Iron  Sucrose), Dose: 200 mg  Infusion Start Time: 0855  Infusion Stop Time: 0911  Post Infusion IV Care: Observation period completed and Peripheral IV Discontinued  Discharge: Condition: Stable, Destination: Home . AVS Declined  Performed by:  Rocky FORBES Sar, RN

## 2024-03-16 ENCOUNTER — Ambulatory Visit: Payer: Self-pay | Admitting: Physician Assistant

## 2024-03-17 ENCOUNTER — Ambulatory Visit

## 2024-03-17 VITALS — BP 117/76 | HR 96 | Temp 98.2°F | Resp 18 | Ht 65.0 in | Wt 225.6 lb

## 2024-03-17 DIAGNOSIS — O99013 Anemia complicating pregnancy, third trimester: Secondary | ICD-10-CM

## 2024-03-17 DIAGNOSIS — Z3A32 32 weeks gestation of pregnancy: Secondary | ICD-10-CM

## 2024-03-17 MED ORDER — SODIUM CHLORIDE 0.9 % IV SOLN
200.0000 mg | Freq: Once | INTRAVENOUS | Status: AC
  Administered 2024-03-17: 200 mg via INTRAVENOUS
  Filled 2024-03-17: qty 10

## 2024-03-17 NOTE — Progress Notes (Signed)
 Diagnosis: Acute Anemia  Provider:  Lonna Coder MD  Procedure: IV Infusion  IV Type: Peripheral, IV Location: L Antecubital  Venofer  (Iron  Sucrose), Dose: 200 mg  Infusion Start Time: 1153  Infusion Stop Time: 1216  Post Infusion IV Care: Observation period completed  Discharge: Condition: Good, Destination: Home . AVS Declined  Performed by:  Eleanor DELENA Bloch, RN

## 2024-03-19 ENCOUNTER — Inpatient Hospital Stay (HOSPITAL_COMMUNITY)
Admission: AD | Admit: 2024-03-19 | Discharge: 2024-03-20 | Disposition: A | Discharge status: Home / Self Care | Attending: Obstetrics & Gynecology | Admitting: Obstetrics & Gynecology

## 2024-03-19 ENCOUNTER — Ambulatory Visit (INDEPENDENT_AMBULATORY_CARE_PROVIDER_SITE_OTHER)

## 2024-03-19 VITALS — BP 123/81 | HR 112 | Temp 97.8°F | Resp 18 | Ht 65.0 in | Wt 223.0 lb

## 2024-03-19 DIAGNOSIS — Z3689 Encounter for other specified antenatal screening: Secondary | ICD-10-CM

## 2024-03-19 DIAGNOSIS — Z3A33 33 weeks gestation of pregnancy: Secondary | ICD-10-CM | POA: Diagnosis not present

## 2024-03-19 DIAGNOSIS — O99013 Anemia complicating pregnancy, third trimester: Secondary | ICD-10-CM | POA: Diagnosis not present

## 2024-03-19 DIAGNOSIS — N200 Calculus of kidney: Secondary | ICD-10-CM | POA: Insufficient documentation

## 2024-03-19 DIAGNOSIS — N201 Calculus of ureter: Secondary | ICD-10-CM | POA: Diagnosis not present

## 2024-03-19 DIAGNOSIS — O26833 Pregnancy related renal disease, third trimester: Secondary | ICD-10-CM | POA: Diagnosis not present

## 2024-03-19 DIAGNOSIS — O99891 Other specified diseases and conditions complicating pregnancy: Secondary | ICD-10-CM | POA: Diagnosis not present

## 2024-03-19 MED ORDER — IRON SUCROSE 200 MG IVPB - SIMPLE MED
200.0000 mg | Freq: Once | Status: AC
  Administered 2024-03-19: 200 mg via INTRAVENOUS

## 2024-03-19 NOTE — MAU Note (Addendum)
 Alyssa Howell is a 22 y.o. at [redacted]w[redacted]d here in MAU reporting pain starting about 1300. Took Oxycodone  and then she had some n/v. She took a nap when the pain dulled some. Now the pain is back and is sharp on L side going down LLQ. Hematuria last 2 times she has voided. Thinks she is trying to pass the stone.  Reports good FM and denies LOF or VB LMP: na  Onset of complaint: 1300 Pain score: 10 Vitals:   03/19/24 2337 03/19/24 2340  BP:  117/67  Pulse: 95   Resp: 17   Temp: 98.1 F (36.7 C)   SpO2: 100%      FHT: 131  Lab orders placed from triage: u/a

## 2024-03-19 NOTE — Progress Notes (Signed)
 Diagnosis: Anemia during pregnancy  Provider:  Lonna Coder MD  Procedure: IV Infusion  IV Type: Peripheral, IV Location: L Antecubital   Venofer  (Iron  Sucrose), Dose: 200 mg  Infusion Start Time: 1012  Infusion Stop Time: 1029  Post Infusion IV Care: Observation period completed and Peripheral IV Discontinued  Discharge: Condition: Good, Destination: Home . AVS Declined  Performed by:  Taytem Ghattas G Pilkington-Burchett, RN

## 2024-03-20 ENCOUNTER — Encounter (HOSPITAL_COMMUNITY): Payer: Self-pay | Admitting: Obstetrics & Gynecology

## 2024-03-20 DIAGNOSIS — N200 Calculus of kidney: Secondary | ICD-10-CM | POA: Diagnosis not present

## 2024-03-20 DIAGNOSIS — O99891 Other specified diseases and conditions complicating pregnancy: Secondary | ICD-10-CM | POA: Diagnosis not present

## 2024-03-20 DIAGNOSIS — Z3A33 33 weeks gestation of pregnancy: Secondary | ICD-10-CM | POA: Diagnosis not present

## 2024-03-20 LAB — URINALYSIS, ROUTINE W REFLEX MICROSCOPIC
Bilirubin Urine: NEGATIVE
Glucose, UA: NEGATIVE mg/dL
Ketones, ur: NEGATIVE mg/dL
Nitrite: NEGATIVE
Protein, ur: 30 mg/dL — AB
RBC / HPF: >50 RBC/hpf (ref 0–5)
Specific Gravity, Urine: 1.016 (ref 1.005–1.030)
pH: 6 (ref 5.0–8.0)

## 2024-03-20 MED ORDER — HYDROMORPHONE HCL 1 MG/ML IJ SOLN
1.0000 mg | Freq: Once | INTRAMUSCULAR | Status: AC
  Administered 2024-03-20: 1 mg via INTRAVENOUS
  Filled 2024-03-20: qty 1

## 2024-03-20 MED ORDER — SODIUM CHLORIDE 0.9 % IV SOLN
25.0000 mg | Freq: Once | INTRAVENOUS | Status: AC
  Administered 2024-03-20: 25 mg via INTRAVENOUS
  Filled 2024-03-20: qty 1

## 2024-03-20 MED ORDER — PROMETHAZINE HCL 25 MG PO TABS: 25.0000 mg | ORAL_TABLET | Freq: Four times a day (QID) | ORAL | 0 refills | Status: AC | PRN

## 2024-03-20 MED ORDER — OXYCODONE HCL 5 MG PO TABS: 5.0000 mg | ORAL_TABLET | ORAL | 0 refills | Status: DC | PRN

## 2024-03-20 MED ORDER — LACTATED RINGERS IV BOLUS
1000.0000 mL | Freq: Once | INTRAVENOUS | Status: AC
  Administered 2024-03-20: 1000 mL via INTRAVENOUS

## 2024-03-20 MED ORDER — MORPHINE SULFATE (PF) 4 MG/ML IV SOLN
4.0000 mg | Freq: Once | INTRAVENOUS | Status: AC
  Administered 2024-03-20: 4 mg via INTRAVENOUS
  Filled 2024-03-20: qty 1

## 2024-03-20 NOTE — MAU Provider Note (Signed)
 History     CSN: [id number]  Arrival date and time: 03/19/24 2306   Event Date/Time   First Provider Initiated Contact with Patient 03/20/24 0050      Chief Complaint  Patient presents with   Nephrolithiasis   Alyssa Howell is a 23 y.o. H4E8877 at [redacted]w[redacted]d who receives care at [facility].  She presents today for kidney stones.  She reports she has noted blood with urination the last few times I went to the bathroom.  She rates her pain a 10/10 and reports taking one 5mg  oxycodone  at that time with no relief.  She states the pain has no known worsening factors.  She reports taking zofran  around 2130 with no relief of nausea.   Patient endorses fetal movement. She denies vaginal bleeding or discharge or concern. She also denies cramping or contractions.    OB History     Gravida  5   Para  2   Term  1   Preterm  1   AB  2   Living  2      SAB  1   IAB  1   Ectopic  0   Multiple  0   Live Births  2           Past Medical History:  Diagnosis Date   ADHD    Anemia    Chlamydia 2022   Depression    GERD (gastroesophageal reflux disease) 10/09/2019   HSV-2 infection    per pt/ seen in ED [facility]   Kidney stones    Seizures (HCC)    x 1 in 5th grade after taking ability. No other episodes.   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   Vaginal pain at introitus  Referred to Pelvic Floor PT      Past Surgical History:  Procedure Laterality Date   CYSTOSCOPY/URETEROSCOPY/HOLMIUM LASER/STENT PLACEMENT Right 08/02/2020   Procedure: CYSTOSCOPY/URETEROSCOPY/HOLMIUM LASER/STENT PLACEMENT;  Surgeon: Penne Knee, MD;  Location: [facility] ORS;  Service: Urology;  Laterality: Right;   DILATION AND CURETTAGE OF UTERUS N/A 08/12/2022   Procedure: DILATATION AND CURETTAGE, ULTRASOUND GUIDED;  Surgeon: Zina Jerilynn LABOR, MD;  Location: [facility] OR;  Service: Gynecology;  Laterality: N/A;   FOOT SURGERY Bilateral    metal screws in toes,2020    TONSILLECTOMY      Family History  Problem Relation Age of Onset   Hepatitis C Mother    Valvular heart disease Mother    Other Father        no contact in over 21yrs   Cancer Maternal Grandmother    Breast cancer Maternal Grandmother    Diabetes Other    Heart attack Other    Asthma Neg Hx    Heart disease Neg Hx    Hypertension Neg Hx     Social History   Tobacco Use   Smoking status: Never   Smokeless tobacco: Never  Vaping Use   Vaping status: Never Used  Substance Use Topics   Alcohol use: Not Currently   Drug use: Not Currently    Types: Marijuana    Comment: last used the end of September 2022 as of 02/07/2021    Allergies:  Allergies  Allergen Reactions   Abilify [Aripiprazole] Other (See Comments)    Seizures   Metronidazole  Hives and Other (See Comments)    Inguinal area    Medications Prior to Admission  Medication Sig Dispense Refill Last Dose/Taking   acetaminophen -caffeine  (EXCEDRIN TENSION HEADACHE) 500-65  MG TABS per tablet Take 2 tablets by mouth 4 (four) times daily as needed (For headaches). 60 tablet 0 Past Month   cyclobenzaprine  (FLEXERIL ) 10 MG tablet Take 0.5-1 tablets (5-10 mg total) by mouth 3 (three) times daily as needed for muscle spasms. 15 tablet 0 Past Week   famotidine  (PEPCID ) 40 MG tablet Take 1 tablet (40 mg total) by mouth daily. 90 tablet 3 Past Week   metoCLOPramide  (REGLAN ) 10 MG tablet Take 1 tablet (10 mg total) by mouth every 6 (six) hours as needed (Headcahes). 30 tablet 0 Past Month   ondansetron  (ZOFRAN -ODT) 8 MG disintegrating tablet Take 1 tablet (8 mg total) by mouth every 8 (eight) hours as needed for nausea or vomiting. 20 tablet 0 03/19/2024   oxyCODONE  (ROXICODONE ) 5 MG immediate release tablet Take 1 tablet (5 mg total) by mouth every 4 (four) hours as needed for up to 30 doses for severe pain (pain score 7-10), moderate pain (pain score 4-6) or breakthrough pain (Take if tylenol  is not helping pain). 20 tablet 0  03/19/2024 at  8:00 PM   Prenatal Vit-Fe Phos-FA-Omega (VITAFOL  GUMMIES) 3.33-0.333-34.8 MG CHEW Chew 3 tablets by mouth daily. 90 tablet 11 03/19/2024   tamsulosin  (FLOMAX ) 0.4 MG CAPS capsule Take 1 capsule (0.4 mg total) by mouth daily. 30 capsule 2 03/19/2024   terconazole  (TERAZOL 7 ) 0.4 % vaginal cream Place 1 applicator vaginally at bedtime. Use for seven days 45 g 0 Past Month   valACYclovir  (VALTREX ) 500 MG tablet Take 1 tablet (500 mg total) by mouth 2 (two) times daily. 60 tablet 4 03/19/2024   acetaminophen  (TYLENOL ) 325 MG tablet Take 650 mg by mouth every 4 (four) hours as needed for mild pain (pain score 1-3) or moderate pain (pain score 4-6).      ferrous sulfate  325 (65 FE) MG tablet Take 1 tablet (325 mg total) by mouth daily. (Patient not taking: Reported on 03/19/2024) 30 tablet 0 Not Taking    Review of Systems  Gastrointestinal:  Positive for nausea and vomiting.  Genitourinary:  Positive for flank pain. Negative for difficulty urinating, dysuria, vaginal bleeding and vaginal discharge.  Neurological:  Negative for numbness.   Physical Exam   Blood pressure 117/77, pulse 100, temperature 98.1 F (36.7 C), resp. rate 17, height 5' 5 (1.651 m), weight 102.3 kg, last menstrual period 06/21/2023, SpO2 99%, not currently breastfeeding.  Physical Exam Vitals reviewed.  Constitutional:      Appearance: Normal appearance.  HENT:     Head: Normocephalic and atraumatic.  Eyes:     Conjunctiva/sclera: Conjunctivae normal.  Cardiovascular:     Rate and Rhythm: Normal rate.  Pulmonary:     Effort: Pulmonary effort is normal. No respiratory distress.  Musculoskeletal:     Cervical back: Normal range of motion.  Neurological:     Mental Status: She is alert and oriented to person, place, and time.  Psychiatric:        Mood and Affect: Mood normal.        Behavior: Behavior normal.     Fetal Assessment 130 bpm, Mod Var, -Decels, +15x15Accels Toco: No ctx  graphed  MAU Course   Results for orders placed or performed during the hospital encounter of 03/19/24 (from the past 24 hours)  Urinalysis, Routine w reflex microscopic -Urine, Clean Catch     Status: Abnormal   Collection Time: 03/19/24 11:55 PM  Result Value Ref Range   Color, Urine YELLOW YELLOW   APPearance CLOUDY (A)  CLEAR   Specific Gravity, Urine 1.016 1.005 - 1.030   pH 6.0 5.0 - 8.0   Glucose, UA NEGATIVE NEGATIVE mg/dL   Hgb urine dipstick LARGE (A) NEGATIVE   Bilirubin Urine NEGATIVE NEGATIVE   Ketones, ur NEGATIVE NEGATIVE mg/dL   Protein, ur 30 (A) NEGATIVE mg/dL   Nitrite NEGATIVE NEGATIVE   Leukocytes,Ua MODERATE (A) NEGATIVE   RBC / HPF >50 0 - 5 RBC/hpf   WBC, UA 21-50 0 - 5 WBC/hpf   Bacteria, UA MANY (A) NONE SEEN   Squamous Epithelial / HPF 6-10 0 - 5 /HPF   Mucus PRESENT    No results found.   MDM Start IV LR Bolus Pain Medication Antiemetic Prescriptions Assessment and Plan   23 year old [id number]  SIUP at 33.2 weeks Cat I FT Nephrolithiasis  -POC Reviewed -Exam performed. -Start IV and give fluids. -Will also give morphine  and phenergan .  -Discussed sending for repeat US  if no relief, from pain medication, to r/o obstruction.  -Patient reports she was told she can have IOL at 35 weeks.  Informed that this was miscommunication as preterm induction not indicated. Earliest induction would be for 37 weeks pending MFM review of patient status.  -Patient verbalizes understanding.  -NST reactive. Okay to discontinue -Monitor and reassess.   Harlene LITTIE Duncans MSN, CNM 03/20/2024, 12:50 AM   Reassessment (3:15 AM) -Nurse reports patient with pain returning. -Provider to bedside and patient states pain was resolved, but is now 7/10. She states nausea has resolved.  -Discussed anticipated pain and discomfort with kidney stones and proper management. -Reassured that taking two oxycodone  in addition to tylenol  is okay if pain is severe. Further informed  of need to treat nausea. -Will send in prescription for phenergan  for home usage and refill on oxycodone .  -Discussed need to continue to strain urine and take flomax  daily.  -Will give dilaudid  and reassess.   Reassessment (4:02 AM) -Patient reports pain now resolved.  -Reiterated that pain will be colicky considering location of stones.  -Patient to follow up as scheduled. -Strict precautions given. -Encouraged to call primary office or return to MAU if symptoms worsen or with the onset of new symptoms. -Discharged to home in improved condition.  Harlene LITTIE Duncans MSN, CNM Advanced Practice Provider, [facility] [employer] [facility]

## 2024-03-21 ENCOUNTER — Encounter (HOSPITAL_COMMUNITY): Payer: Self-pay | Admitting: Obstetrics and Gynecology

## 2024-03-21 ENCOUNTER — Encounter: Payer: Self-pay | Admitting: Physician Assistant

## 2024-03-21 ENCOUNTER — Inpatient Hospital Stay (HOSPITAL_COMMUNITY)
Admission: AD | Admit: 2024-03-21 | Discharge: 2024-03-21 | Disposition: A | Discharge status: Home / Self Care | Payer: Self-pay | Attending: Obstetrics and Gynecology | Admitting: Obstetrics and Gynecology

## 2024-03-21 ENCOUNTER — Inpatient Hospital Stay (HOSPITAL_COMMUNITY)

## 2024-03-21 DIAGNOSIS — Z3689 Encounter for other specified antenatal screening: Secondary | ICD-10-CM | POA: Insufficient documentation

## 2024-03-21 DIAGNOSIS — R102 Pelvic and perineal pain unspecified side: Secondary | ICD-10-CM | POA: Diagnosis not present

## 2024-03-21 DIAGNOSIS — O09213 Supervision of pregnancy with history of pre-term labor, third trimester: Secondary | ICD-10-CM | POA: Diagnosis not present

## 2024-03-21 DIAGNOSIS — R93421 Abnormal radiologic findings on diagnostic imaging of right kidney: Secondary | ICD-10-CM | POA: Diagnosis not present

## 2024-03-21 DIAGNOSIS — O99891 Other specified diseases and conditions complicating pregnancy: Secondary | ICD-10-CM | POA: Diagnosis not present

## 2024-03-21 DIAGNOSIS — O26893 Other specified pregnancy related conditions, third trimester: Secondary | ICD-10-CM | POA: Diagnosis not present

## 2024-03-21 DIAGNOSIS — Z3A33 33 weeks gestation of pregnancy: Secondary | ICD-10-CM | POA: Diagnosis not present

## 2024-03-21 DIAGNOSIS — N132 Hydronephrosis with renal and ureteral calculous obstruction: Secondary | ICD-10-CM | POA: Insufficient documentation

## 2024-03-21 DIAGNOSIS — O0990 Supervision of high risk pregnancy, unspecified, unspecified trimester: Secondary | ICD-10-CM

## 2024-03-21 DIAGNOSIS — N200 Calculus of kidney: Secondary | ICD-10-CM | POA: Diagnosis not present

## 2024-03-21 DIAGNOSIS — O99893 Other specified diseases and conditions complicating puerperium: Secondary | ICD-10-CM | POA: Diagnosis not present

## 2024-03-21 LAB — CBC
HCT: 32.6 % — ABNORMAL LOW (ref 36.0–46.0)
Hemoglobin: 10.2 g/dL — ABNORMAL LOW (ref 12.0–15.0)
MCH: 30.4 pg (ref 26.0–34.0)
MCHC: 31.3 g/dL (ref 30.0–36.0)
MCV: 97 fL (ref 80.0–100.0)
Platelets: 321 K/uL (ref 150–400)
RBC: 3.36 MIL/uL — ABNORMAL LOW (ref 3.87–5.11)
RDW: 14.3 % (ref 11.5–15.5)
WBC: 13.6 K/uL — ABNORMAL HIGH (ref 4.0–10.5)
nRBC: 0 % (ref 0.0–0.2)

## 2024-03-21 LAB — URINALYSIS, ROUTINE W REFLEX MICROSCOPIC
Bilirubin Urine: NEGATIVE
Glucose, UA: NEGATIVE mg/dL
Ketones, ur: NEGATIVE mg/dL
Nitrite: NEGATIVE
Protein, ur: 30 mg/dL — AB
RBC / HPF: >50 RBC/hpf (ref 0–5)
Specific Gravity, Urine: 1.016 (ref 1.005–1.030)
pH: 6 (ref 5.0–8.0)

## 2024-03-21 MED ORDER — HYDROMORPHONE HCL 1 MG/ML IJ SOLN
1.0000 mg | Freq: Once | INTRAMUSCULAR | Status: AC
  Administered 2024-03-21: 1 mg via INTRAVENOUS
  Filled 2024-03-21: qty 1

## 2024-03-21 MED ORDER — HYDROMORPHONE HCL 2 MG PO TABS: 2.0000 mg | ORAL_TABLET | ORAL | 0 refills | Status: AC | PRN

## 2024-03-21 MED ORDER — LACTATED RINGERS IV BOLUS
1000.0000 mL | Freq: Once | INTRAVENOUS | Status: AC
  Administered 2024-03-21: 1000 mL via INTRAVENOUS

## 2024-03-21 MED ORDER — HYDROMORPHONE HCL 2 MG PO TABS: 2.0000 mg | ORAL_TABLET | ORAL | 0 refills | Status: DC | PRN

## 2024-03-21 NOTE — MAU Note (Signed)
 Alyssa Howell is a 23 y.o. at [redacted]w[redacted]d here in MAU reporting: dx with kidney stones. Stated pain has gotten worse. Now in her vagina as well as her back. Took oxycodine 2:30pm without releif. Stated she thinks she might  have lost her mucus plug a s well today so is unsure if that is related to the pain. Fetal movement is little less than usual.   LMP:  Onset of complaint: today Pain score: 10 Vitals:   03/21/24 1721  BP: 108/75  Pulse: (!) 123  Resp: 18  Temp: 98 F (36.7 C)     FHT: 10   Lab orders placed from triage: u/a

## 2024-03-21 NOTE — MAU Provider Note (Signed)
 Chief Complaint:  Pelvic Pain and Back Pain  HPI   Event Date/Time   First Provider Initiated Contact with Patient 03/21/24 1758     Alyssa Howell is a 23 y.o. [id number] at [redacted]w[redacted]d who presents to maternity admissions reporting increased flank and now lower abdominal pain, just above pubic symphysis, constant and sharp. Had some discharge earlier and is concerned she's lost her mucus plug since she has a history of preterm delivery at [redacted]w[redacted]d.  Was seen overnight, went home and pain returned. Took 5mg  oxycodone  at 2:30pm without relief. Has also taken her flomax  today but hasn't had much to drink. Denies nausea/vomiting. No other physical complaints.   Pregnancy Course: Receives care at [facility], records and previous MAU visit notes reviewed.  Past Medical History:  Diagnosis Date   ADHD    Anemia    Chlamydia 2022   Depression    GERD (gastroesophageal reflux disease) 10/09/2019   HSV-2 infection    per pt/ seen in ED [facility]   Kidney stones    Seizures (HCC)    x 1 in 5th grade after taking ability. No other episodes.   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   Vaginal pain at introitus  Referred to Pelvic Floor PT     OB History  Gravida Para Term Preterm AB Living  5 2 1 1 2 2   SAB IAB Ectopic Multiple Live Births  1 1 0 0 2    # Outcome Date GA Lbr Len/2nd Weight Sex Type Anes PTL Lv  5 Current           4 IAB 01/2023          3 Preterm 08/05/22 [redacted]w[redacted]d 26:50 / 00:32 5 lb 11.4 oz (2.59 kg) M Vag-Spont EPI  LIV  2 Term 03/2021 [redacted]w[redacted]d  7 lb 14.5 oz (3.585 kg) M Vag-Spont   LIV  1 SAB            Past Surgical History:  Procedure Laterality Date   CYSTOSCOPY/URETEROSCOPY/HOLMIUM LASER/STENT PLACEMENT Right 08/02/2020   Procedure: CYSTOSCOPY/URETEROSCOPY/HOLMIUM LASER/STENT PLACEMENT;  Surgeon: Penne Knee, MD;  Location: [facility] ORS;  Service: Urology;  Laterality: Right;   DILATION AND CURETTAGE OF UTERUS N/A 08/12/2022   Procedure: DILATATION AND  CURETTAGE, ULTRASOUND GUIDED;  Surgeon: Zina Jerilynn LABOR, MD;  Location: [facility] OR;  Service: Gynecology;  Laterality: N/A;   FOOT SURGERY Bilateral    metal screws in toes,2020   TONSILLECTOMY     Family History  Problem Relation Age of Onset   Hepatitis C Mother    Valvular heart disease Mother    Other Father        no contact in over 71yrs   Cancer Maternal Grandmother    Breast cancer Maternal Grandmother    Diabetes Other    Heart attack Other    Asthma Neg Hx    Heart disease Neg Hx    Hypertension Neg Hx    Social History   Tobacco Use   Smoking status: Never   Smokeless tobacco: Never  Vaping Use   Vaping status: Never Used  Substance Use Topics   Alcohol use: Not Currently   Drug use: Not Currently    Types: Marijuana    Comment: last used the end of September 2022 as of 02/07/2021   Allergies  Allergen Reactions   Abilify [Aripiprazole] Other (See Comments)    Seizures   Metronidazole  Hives and Other (See Comments)    Inguinal area  No medications prior to admission.   I have reviewed patient's Past Medical Hx, Surgical Hx, Family Hx, Social Hx, medications and allergies.   ROS  Pertinent items noted in HPI and remainder of comprehensive ROS otherwise negative.   PHYSICAL EXAM  Patient Vitals for the past 24 hrs:  BP Temp Pulse Resp  03/21/24 1734 112/66 -- (!) 107 --  03/21/24 1721 108/75 98 F (36.7 C) (!) 123 18   Constitutional: Well-developed, well-nourished female in no acute distress.  Cardiovascular: normal rate & rhythm, warm and well-perfused Respiratory: normal effort, no problems with respiration noted GI: Abd soft, non-tender, non-distended MS: Extremities nontender, no edema, normal ROM Neurologic: Alert and oriented x 4.  GU: left CVA tenderness Pelvic: normal external female genitalia, physiologic discharge, no blood, cervix clean.   Dilation: 2 Effacement (%): Thick Exam by:: DOROTHA Finder, CNM  Fetal Tracing: reactive Baseline:  140 Variability: moderate Accelerations: 15x15 Decelerations: none Toco: relaxed   Labs: Results for orders placed or performed during the hospital encounter of 03/21/24 (from the past 24 hours)  Urinalysis, Routine w reflex microscopic -Urine, Clean Catch     Status: Abnormal   Collection Time: 03/21/24  5:24 PM  Result Value Ref Range   Color, Urine YELLOW YELLOW   APPearance CLOUDY (A) CLEAR   Specific Gravity, Urine 1.016 1.005 - 1.030   pH 6.0 5.0 - 8.0   Glucose, UA NEGATIVE NEGATIVE mg/dL   Hgb urine dipstick LARGE (A) NEGATIVE   Bilirubin Urine NEGATIVE NEGATIVE   Ketones, ur NEGATIVE NEGATIVE mg/dL   Protein, ur 30 (A) NEGATIVE mg/dL   Nitrite NEGATIVE NEGATIVE   Leukocytes,Ua MODERATE (A) NEGATIVE   RBC / HPF >50 0 - 5 RBC/hpf   WBC, UA 6-10 0 - 5 WBC/hpf   Bacteria, UA FEW (A) NONE SEEN   Squamous Epithelial / HPF 0-5 0 - 5 /HPF   Mucus PRESENT   CBC     Status: Abnormal   Collection Time: 03/21/24  6:48 PM  Result Value Ref Range   WBC 13.6 (H) 4.0 - 10.5 K/uL   RBC 3.36 (L) 3.87 - 5.11 MIL/uL   Hemoglobin 10.2 (L) 12.0 - 15.0 g/dL   HCT 67.3 (L) 63.9 - 53.9 %   MCV 97.0 80.0 - 100.0 fL   MCH 30.4 26.0 - 34.0 pg   MCHC 31.3 30.0 - 36.0 g/dL   RDW 85.6 88.4 - 84.4 %   Platelets 321 150 - 400 K/uL   nRBC 0.0 0.0 - 0.2 %   Imaging:  US  RENAL Result Date: 03/21/2024 EXAM: US  Retroperitoneum Complete, Renal. 03/21/2024 06:52:00 PM TECHNIQUE: Real-time ultrasonography of the retroperitoneum renal was performed. COMPARISON: CT 03/09/2024 and ultrasound 02/24/2024 CLINICAL HISTORY: Kidney stone complicating pregnancy. FINDINGS: FINDINGS: RIGHT KIDNEY/URETER: Right kidney measures 12.1 cm in length. 5 mm nonshadowing echogenic focus in the upper right kidney likely corresponds to a renal stone. No right hydronephrosis. Normal cortical echogenicity. LEFT KIDNEY/URETER: Left kidney measures 10.8 cm in length. 7 mm stone in the upper pole of the left kidney is  redemonstrated similar prior CT and ultrasound. Additional 5 mm stone in the mid left kidney. Moderate left pelvocaliectasis has increased since 02/24/2024. There is a dilated left ureter measuring up to 9 mm with an echogenic focus near the mid to lower left ureter measuring 10 mm. Query obstructing stone. Normal cortical echogenicity. No mass. BLADDER: Unremarkable appearance of the bladder. Ureteral jets were not visualized. IMPRESSION: 1. Moderate left pelvocaliectasis with dilated  left ureter up to 9 mm and a 10 mm echogenic focus in the mid-to-lower left ureter, suspicious for an obstructing ureteral stone; ureteral jets not visualized. 2. 7 mm stone in the upper pole of the left kidney and additional 5 mm stone in the mid left kidney. 3. 5 mm nonshadowing echogenic focus in the upper right kidney, likely a renal stone; no right hydronephrosis. Electronically signed by: Norman Gatlin MD 03/21/2024 07:17 PM EST RP Workstation: [id number]   MDM & MAU COURSE  MDM: High  MAU Course: Orders Placed This Encounter  Procedures   US  RENAL   Urinalysis, Routine w reflex microscopic -Urine, Clean Catch   CBC   Ambulatory referral to Nephrology   Discharge patient   Meds ordered this encounter  Medications   lactated ringers  bolus 1,000 mL   HYDROmorphone  (DILAUDID ) injection 1 mg   DISCONTD: HYDROmorphone  (DILAUDID ) 2 MG tablet    Sig: Take 1 tablet (2 mg total) by mouth every 4 (four) hours as needed for severe pain (pain score 7-10).    Dispense:  30 tablet    Refill:  0   HYDROmorphone  (DILAUDID ) 2 MG tablet    Sig: Take 1 tablet (2 mg total) by mouth every 4 (four) hours as needed for severe pain (pain score 7-10).    Dispense:  30 tablet    Refill:  0   Dilaudid  and fluids ordered, pt sent for repeat renal u/s to check for obstructing stone. Pain well controlled with dilaudid , cervix unchanged. Results reviewed with Dr. Alger who suggested discharge with dilaudid  and expectation that  she is likely to experience some discomfort until delivery at 37wks and post-delivery lithotripsy. Pt discharged with dilaudid , encouraged fluids/lemonade. Return precautions given.  ASSESSMENT   1. Supervision of high risk pregnancy, antepartum   2. Calculus of kidney affecting pregnancy in third trimester   3. NST (non-stress test) reactive   4. [redacted] weeks gestation of pregnancy     PLAN  Discharge home in stable condition with return precautions.     Follow-up Information     [facility] Follow up.   Specialty: Obstetrics and Gynecology Why: as scheduled for ongoing prenatal care Contact information: [address] [place]  [address] [phone]                Allergies as of 03/21/2024       Reactions   Abilify [aripiprazole] Other (See Comments)   Seizures   Metronidazole  Hives, Other (See Comments)   Inguinal area        Medication List     STOP taking these medications    oxyCODONE  5 MG immediate release tablet Commonly known as: Roxicodone        TAKE these medications    acetaminophen  325 MG tablet Commonly known as: TYLENOL  Take 650 mg by mouth every 4 (four) hours as needed for mild pain (pain score 1-3) or moderate pain (pain score 4-6).   acetaminophen -caffeine  500-65 MG Tabs per tablet Commonly known as: EXCEDRIN TENSION HEADACHE Take 2 tablets by mouth 4 (four) times daily as needed (For headaches).   cyclobenzaprine  10 MG tablet Commonly known as: FLEXERIL  Take 0.5-1 tablets (5-10 mg total) by mouth 3 (three) times daily as needed for muscle spasms.   famotidine  40 MG tablet Commonly known as: PEPCID  Take 1 tablet (40 mg total) by mouth daily.   ferrous sulfate  325 (65 FE) MG tablet Take 1 tablet (325  mg total) by mouth daily.   HYDROmorphone  2 MG tablet Commonly known as: DILAUDID  Take 1 tablet (2 mg total) by mouth every 4 (four) hours as needed for  severe pain (pain score 7-10).   ondansetron  8 MG disintegrating tablet Commonly known as: ZOFRAN -ODT Take 1 tablet (8 mg total) by mouth every 8 (eight) hours as needed for nausea or vomiting.   promethazine  25 MG tablet Commonly known as: PHENERGAN  Take 1 tablet (25 mg total) by mouth every 6 (six) hours as needed for nausea or vomiting.   tamsulosin  0.4 MG Caps capsule Commonly known as: FLOMAX  Take 1 capsule (0.4 mg total) by mouth daily.   terconazole  0.4 % vaginal cream Commonly known as: TERAZOL 7  Place 1 applicator vaginally at bedtime. Use for seven days   valACYclovir  500 MG tablet Commonly known as: Valtrex  Take 1 tablet (500 mg total) by mouth 2 (two) times daily.   Vitafol  Gummies 3.33-0.333-34.8 MG Chew Chew 3 tablets by mouth daily.        Cornell Finder, CNM, MSN, IBCLC Certified Nurse Midwife, [facility]

## 2024-03-21 NOTE — Discharge Instructions (Signed)
 Push fluids, especially lemonade which has been known to help dissolve/relieve stones.

## 2024-03-24 ENCOUNTER — Ambulatory Visit (INDEPENDENT_AMBULATORY_CARE_PROVIDER_SITE_OTHER)

## 2024-03-24 VITALS — BP 110/65 | HR 115 | Temp 98.8°F | Resp 20 | Ht 65.0 in | Wt 225.6 lb

## 2024-03-24 DIAGNOSIS — Z3A33 33 weeks gestation of pregnancy: Secondary | ICD-10-CM | POA: Diagnosis not present

## 2024-03-24 DIAGNOSIS — O99013 Anemia complicating pregnancy, third trimester: Secondary | ICD-10-CM

## 2024-03-24 MED ORDER — IRON SUCROSE 200 MG IVPB - SIMPLE MED
200.0000 mg | Freq: Once | Status: AC
  Administered 2024-03-24: 200 mg via INTRAVENOUS
  Filled 2024-03-24: qty 110

## 2024-03-24 NOTE — Progress Notes (Signed)
 Diagnosis: Acute Anemia  Provider:  Mannam, Praveen MD  Procedure: IV Infusion  IV Type: Peripheral, IV Location: L Antecubital  Venofer  (Iron  Sucrose), Dose: 200 mg  Infusion Start Time: 1146  Infusion Stop Time: 1205  Post Infusion IV Care: Observation period completed  Discharge: Condition: Good, Destination: Home . AVS Declined  Performed by:  Eleanor DELENA Bloch, RN

## 2024-03-26 ENCOUNTER — Ambulatory Visit (INDEPENDENT_AMBULATORY_CARE_PROVIDER_SITE_OTHER)

## 2024-03-26 ENCOUNTER — Ambulatory Visit

## 2024-03-26 ENCOUNTER — Other Ambulatory Visit (HOSPITAL_COMMUNITY)
Admission: RE | Admit: 2024-03-26 | Discharge: 2024-03-26 | Disposition: A | Discharge status: Home / Self Care | Source: Ambulatory Visit

## 2024-03-26 VITALS — BP 117/68 | HR 102 | Temp 98.2°F | Resp 16 | Ht 65.0 in | Wt 225.6 lb

## 2024-03-26 VITALS — BP 127/78 | HR 118 | Wt 230.1 lb

## 2024-03-26 DIAGNOSIS — Z3A34 34 weeks gestation of pregnancy: Secondary | ICD-10-CM

## 2024-03-26 DIAGNOSIS — A6004 Herpesviral vulvovaginitis: Secondary | ICD-10-CM

## 2024-03-26 DIAGNOSIS — O99891 Other specified diseases and conditions complicating pregnancy: Secondary | ICD-10-CM

## 2024-03-26 DIAGNOSIS — O0990 Supervision of high risk pregnancy, unspecified, unspecified trimester: Secondary | ICD-10-CM | POA: Insufficient documentation

## 2024-03-26 DIAGNOSIS — O99013 Anemia complicating pregnancy, third trimester: Secondary | ICD-10-CM

## 2024-03-26 DIAGNOSIS — N200 Calculus of kidney: Secondary | ICD-10-CM

## 2024-03-26 DIAGNOSIS — O0993 Supervision of high risk pregnancy, unspecified, third trimester: Secondary | ICD-10-CM

## 2024-03-26 DIAGNOSIS — O09893 Supervision of other high risk pregnancies, third trimester: Secondary | ICD-10-CM

## 2024-03-26 DIAGNOSIS — Z8619 Personal history of other infectious and parasitic diseases: Secondary | ICD-10-CM | POA: Diagnosis not present

## 2024-03-26 DIAGNOSIS — O09899 Supervision of other high risk pregnancies, unspecified trimester: Secondary | ICD-10-CM

## 2024-03-26 MED ORDER — VALACYCLOVIR HCL 500 MG PO TABS: 500.0000 mg | ORAL_TABLET | Freq: Two times a day (BID) | ORAL | 4 refills | Status: AC

## 2024-03-26 MED ORDER — IRON SUCROSE 200 MG IVPB - SIMPLE MED
200.0000 mg | Freq: Once | Status: AC
  Administered 2024-03-26: 200 mg via INTRAVENOUS
  Filled 2024-03-26: qty 110

## 2024-03-26 NOTE — Progress Notes (Signed)
 Diagnosis: Iron  Deficiency Anemia  Provider:  Praveen Mannam MD  Procedure: IV Infusion  IV Type: Peripheral, IV Location: L Antecubital   Venofer  (Iron  Sucrose), Dose: 200 mg  Infusion Start Time: 1139  Infusion Stop Time: 1155  Post Infusion IV Care: Observation period completed and Peripheral IV Discontinued  Discharge: Condition: Good, Destination: Home . AVS Declined  Performed by:  Maximiano JONELLE Pouch, LPN

## 2024-03-26 NOTE — Progress Notes (Signed)
 Subjective:  Alyssa Howell is a 23 y.o. H4E8877 at [redacted]w[redacted]d being seen today for ongoing prenatal care.  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  Seen in the MAU 11/28 due to severe pain from nephrolithiasis left kidney has a 5mm and 7mm stone and 10mm left ureter stone. Currently on PRN Dilaudid  which is still not treating pain adequately. She visited with urology and no interventions until delivery Per MFM on 1/20 IOL for severe nephrolithiasis pain acceptable at 37wks.    Contractions: Irregular. Vag. Bleeding: None.  Movement: Present. Denies leaking of fluid.   The following portions of the patient's history were reviewed and updated as appropriate: allergies, current medications, past family history, past medical history, past social history, past surgical history and problem list. Problem list updated.  Objective:   Vitals:   03/26/24 1406  BP: 127/78  Pulse: (!) 118  Weight: 230 lb 1.6 oz (104.4 kg)    Fetal Status: Fetal Heart Rate (bpm): 139   Movement: Present     General:  Alert, oriented and cooperative. Patient is in no acute distress.  Skin: Skin is warm and dry. No rash noted.   Cardiovascular: Normal heart rate noted  Respiratory: Normal respiratory effort, no problems with respiration noted  Abdomen: Soft, gravid, appropriate for gestational age. Pain/Pressure: Present     Pelvic: Vag. Bleeding: None     Cervical exam deferred        Extremities: Normal range of motion.  Edema: None  Mental Status: Normal mood and affect. Normal behavior. Normal  judgment and thought content.     Assessment and Plan:  Pregnancy: [id number] at [redacted]w[redacted]d  1. Supervision of high risk pregnancy, antepartum (Primary) Schedule IOL at 37wk due to severe pain with nephrolithiasis. GBS in anticipation of delivery at 37wk or sooner given ctx and hx of PTL - Culture, beta strep (group b only)  2. Herpes simplex vulvovaginitis Already taking. Refills - valACYclovir  (VALTREX ) 500 MG tablet; Take 1 tablet (500 mg total) by mouth 2 (two) times daily.  Dispense: 60 tablet; Refill: 4  3. Pregnancy with nephrolithiasis See above  4. History of preterm delivery, currently pregnant Swabs in anticipation of possible PTL  5. History of chlamydia - Cervicovaginal ancillary only   Preterm labor symptoms and general obstetric precautions including but not limited to vaginal bleeding, contractions, leaking of fluid and fetal movement were reviewed in detail with the patient. Please refer to After Visit Summary for other counseling recommendations.  No follow-ups on file.   Trudy Leeroy NOVAK, MD

## 2024-03-26 NOTE — Progress Notes (Signed)
 Pt notes constant vaginal pressure. MAU on 11/28 pt noted was 2cm at this time.  She has concerns she will be delivering early, as her previous baby delivered at 34 wks Discussed being induced at 35 wks d/t severe kidney stones. Pt has confirmed kidney stone and was given pain meds, however medications not helping w pain.

## 2024-03-27 ENCOUNTER — Ambulatory Visit: Payer: Self-pay

## 2024-03-27 ENCOUNTER — Other Ambulatory Visit: Payer: Self-pay

## 2024-03-27 ENCOUNTER — Encounter (HOSPITAL_COMMUNITY): Payer: Self-pay | Admitting: Family Medicine

## 2024-03-27 ENCOUNTER — Encounter (HOSPITAL_COMMUNITY): Admission: AD | Disposition: A | Payer: Self-pay | Source: Home / Self Care | Attending: Family Medicine

## 2024-03-27 ENCOUNTER — Observation Stay (HOSPITAL_COMMUNITY)

## 2024-03-27 ENCOUNTER — Observation Stay (HOSPITAL_COMMUNITY)
Admission: AD | Admit: 2024-03-27 | Discharge: 2024-03-28 | Disposition: A | Attending: Family Medicine | Admitting: Family Medicine

## 2024-03-27 ENCOUNTER — Inpatient Hospital Stay (HOSPITAL_COMMUNITY)

## 2024-03-27 DIAGNOSIS — B9689 Other specified bacterial agents as the cause of diseases classified elsewhere: Secondary | ICD-10-CM

## 2024-03-27 DIAGNOSIS — N1330 Unspecified hydronephrosis: Secondary | ICD-10-CM | POA: Diagnosis not present

## 2024-03-27 DIAGNOSIS — N201 Calculus of ureter: Secondary | ICD-10-CM | POA: Diagnosis not present

## 2024-03-27 DIAGNOSIS — A599 Trichomoniasis, unspecified: Secondary | ICD-10-CM | POA: Diagnosis present

## 2024-03-27 DIAGNOSIS — N200 Calculus of kidney: Secondary | ICD-10-CM

## 2024-03-27 DIAGNOSIS — F32A Depression, unspecified: Secondary | ICD-10-CM | POA: Diagnosis not present

## 2024-03-27 DIAGNOSIS — Z3A34 34 weeks gestation of pregnancy: Secondary | ICD-10-CM | POA: Diagnosis not present

## 2024-03-27 DIAGNOSIS — A6004 Herpesviral vulvovaginitis: Secondary | ICD-10-CM | POA: Diagnosis present

## 2024-03-27 DIAGNOSIS — R109 Unspecified abdominal pain: Secondary | ICD-10-CM | POA: Diagnosis not present

## 2024-03-27 DIAGNOSIS — O0990 Supervision of high risk pregnancy, unspecified, unspecified trimester: Principal | ICD-10-CM

## 2024-03-27 DIAGNOSIS — N132 Hydronephrosis with renal and ureteral calculous obstruction: Secondary | ICD-10-CM | POA: Diagnosis not present

## 2024-03-27 HISTORY — PX: CYSTOSCOPY W/ URETERAL STENT PLACEMENT: SHX1429

## 2024-03-27 LAB — CBC
HCT: 32.8 % — ABNORMAL LOW (ref 36.0–46.0)
HCT: 30.7 % — ABNORMAL LOW (ref 36.0–46.0)
Hemoglobin: 10.3 g/dL — ABNORMAL LOW (ref 12.0–15.0)
Hemoglobin: 9.7 g/dL — ABNORMAL LOW (ref 12.0–15.0)
MCH: 30.7 pg (ref 26.0–34.0)
MCH: 30.7 pg (ref 26.0–34.0)
MCHC: 31.4 g/dL (ref 30.0–36.0)
MCHC: 31.6 g/dL (ref 30.0–36.0)
MCV: 97.6 fL (ref 80.0–100.0)
MCV: 97.2 fL (ref 80.0–100.0)
Platelets: 272 K/uL (ref 150–400)
Platelets: 249 K/uL (ref 150–400)
RBC: 3.36 MIL/uL — ABNORMAL LOW (ref 3.87–5.11)
RBC: 3.16 MIL/uL — ABNORMAL LOW (ref 3.87–5.11)
RDW: 15.9 % — ABNORMAL HIGH (ref 11.5–15.5)
RDW: 15.8 % — ABNORMAL HIGH (ref 11.5–15.5)
WBC: 13.1 K/uL — ABNORMAL HIGH (ref 4.0–10.5)
WBC: 13.8 K/uL — ABNORMAL HIGH (ref 4.0–10.5)
nRBC: 0 % (ref 0.0–0.2)
nRBC: 0 % (ref 0.0–0.2)

## 2024-03-27 LAB — COMPREHENSIVE METABOLIC PANEL WITH GFR
ALT: 37 U/L (ref 0–44)
AST: 22 U/L (ref 15–41)
Albumin: 2.7 g/dL — ABNORMAL LOW (ref 3.5–5.0)
Alkaline Phosphatase: 147 U/L — ABNORMAL HIGH (ref 38–126)
Anion gap: 7 (ref 5–15)
BUN: 10 mg/dL (ref 6–20)
CO2: 22 mmol/L (ref 22–32)
Calcium: 8.6 mg/dL — ABNORMAL LOW (ref 8.9–10.3)
Chloride: 107 mmol/L (ref 98–111)
Creatinine, Ser: 0.83 mg/dL (ref 0.44–1.00)
GFR, Estimated: >60 mL/min (ref >60)
Glucose, Bld: 90 mg/dL (ref 70–99)
Potassium: 3.8 mmol/L (ref 3.5–5.1)
Sodium: 136 mmol/L (ref 135–145)
Total Bilirubin: 0.5 mg/dL (ref 0.0–1.2)
Total Protein: 6.6 g/dL (ref 6.5–8.1)

## 2024-03-27 LAB — CERVICOVAGINAL ANCILLARY ONLY
Bacterial Vaginitis (gardnerella): NEGATIVE
Candida Glabrata: NEGATIVE
Candida Vaginitis: POSITIVE — AB
Chlamydia: NEGATIVE
Comment: NEGATIVE
Comment: NEGATIVE
Comment: NEGATIVE
Comment: NEGATIVE
Comment: NEGATIVE
Comment: NORMAL
Neisseria Gonorrhea: NEGATIVE
Trichomonas: POSITIVE — AB

## 2024-03-27 LAB — URINALYSIS, ROUTINE W REFLEX MICROSCOPIC
Glucose, UA: NEGATIVE mg/dL
Ketones, ur: NEGATIVE mg/dL
Leukocytes,Ua: NEGATIVE
Nitrite: NEGATIVE
Protein, ur: 100 mg/dL — AB
Specific Gravity, Urine: >1.03 — ABNORMAL HIGH (ref 1.005–1.030)
pH: 6 (ref 5.0–8.0)

## 2024-03-27 LAB — URINALYSIS, MICROSCOPIC (REFLEX)

## 2024-03-27 LAB — TYPE AND SCREEN
ABO/RH(D): A POS
Antibody Screen: NEGATIVE

## 2024-03-27 LAB — CREATININE, SERUM
Creatinine, Ser: 0.73 mg/dL (ref 0.44–1.00)
GFR, Estimated: >60 mL/min (ref >60)

## 2024-03-27 SURGERY — CYSTOSCOPY, WITH RETROGRADE PYELOGRAM AND URETERAL STENT INSERTION
Anesthesia: General | Laterality: Left

## 2024-03-27 MED ORDER — PROPOFOL 10 MG/ML IV BOLUS
INTRAVENOUS | Status: AC
  Filled 2024-03-27: qty 20

## 2024-03-27 MED ORDER — LACTATED RINGERS IV SOLN: 125.0000 mL/h | INTRAVENOUS | Status: DC

## 2024-03-27 MED ORDER — DEXMEDETOMIDINE HCL IN NACL 80 MCG/20ML IV SOLN
INTRAVENOUS | Status: DC | PRN
  Administered 2024-03-27: 12 ug via INTRAVENOUS

## 2024-03-27 MED ORDER — CLINDAMYCIN PHOSPHATE 2 % VA CREA: 1.0000 | TOPICAL_CREAM | Freq: Every day | VAGINAL | 0 refills | Status: DC

## 2024-03-27 MED ORDER — ONDANSETRON HCL 4 MG/2ML IJ SOLN
4.0000 mg | Freq: Once | INTRAMUSCULAR | Status: AC
  Administered 2024-03-27: 4 mg via INTRAVENOUS
  Filled 2024-03-27: qty 2

## 2024-03-27 MED ORDER — LACTATED RINGERS IV BOLUS
1000.0000 mL | Freq: Once | INTRAVENOUS | Status: AC
  Administered 2024-03-27: 1000 mL via INTRAVENOUS

## 2024-03-27 MED ORDER — HYDROMORPHONE HCL 1 MG/ML IJ SOLN
1.0000 mg | Freq: Once | INTRAMUSCULAR | Status: AC
  Administered 2024-03-27: 1 mg via INTRAVENOUS
  Filled 2024-03-27: qty 1

## 2024-03-27 MED ORDER — LIDOCAINE HCL (CARDIAC) PF 100 MG/5ML IV SOSY
PREFILLED_SYRINGE | INTRAVENOUS | Status: DC | PRN
  Administered 2024-03-27: 100 mg via INTRAVENOUS

## 2024-03-27 MED ORDER — IOHEXOL 300 MG/ML  SOLN
INTRAMUSCULAR | Status: DC | PRN
  Administered 2024-03-27: 10 mL

## 2024-03-27 MED ORDER — ONDANSETRON HCL 4 MG/2ML IJ SOLN
INTRAMUSCULAR | Status: DC | PRN
  Administered 2024-03-27: 4 mg via INTRAVENOUS

## 2024-03-27 MED ORDER — SODIUM CHLORIDE 0.9 % IR SOLN
Status: DC | PRN
  Administered 2024-03-27: 3000 mL

## 2024-03-27 MED ORDER — BORIC ACID 600 MG VA SUPP: 600.0000 mg | Freq: Two times a day (BID) | VAGINAL | 0 refills | Status: AC

## 2024-03-27 MED ORDER — DEXAMETHASONE SOD PHOSPHATE PF 10 MG/ML IJ SOLN
INTRAMUSCULAR | Status: DC | PRN
  Administered 2024-03-27: 10 mg via INTRAVENOUS

## 2024-03-27 MED ORDER — CEFAZOLIN SODIUM-DEXTROSE 2-3 GM-%(50ML) IV SOLR
INTRAVENOUS | Status: DC | PRN
  Administered 2024-03-27: 2 g via INTRAVENOUS

## 2024-03-27 MED ORDER — FAMOTIDINE IN NACL 20-0.9 MG/50ML-% IV SOLN
20.0000 mg | Freq: Once | INTRAVENOUS | Status: AC
  Administered 2024-03-27: 20 mg via INTRAVENOUS
  Filled 2024-03-27: qty 50

## 2024-03-27 MED ORDER — PHENYLEPHRINE HCL (PRESSORS) 10 MG/ML IV SOLN
INTRAVENOUS | Status: DC | PRN
  Administered 2024-03-27: 160 ug via INTRAVENOUS
  Administered 2024-03-27: 80 ug via INTRAVENOUS
  Administered 2024-03-27 (×2): 160 ug via INTRAVENOUS

## 2024-03-27 MED ORDER — FENTANYL CITRATE (PF) 100 MCG/2ML IJ SOLN
INTRAMUSCULAR | Status: AC
  Filled 2024-03-27: qty 2

## 2024-03-27 MED ORDER — LACTATED RINGERS IV SOLN: INTRAVENOUS | Status: DC | PRN

## 2024-03-27 MED ORDER — PRENATAL MULTIVITAMIN CH
1.0000 | ORAL_TABLET | Freq: Every day | ORAL | Status: DC
  Administered 2024-03-28: 1 via ORAL
  Filled 2024-03-27: qty 1

## 2024-03-27 MED ORDER — EPHEDRINE SULFATE (PRESSORS) 25 MG/5ML IV SOSY
PREFILLED_SYRINGE | INTRAVENOUS | Status: DC | PRN
  Administered 2024-03-27: 5 mg via INTRAVENOUS

## 2024-03-27 MED ORDER — ACETAMINOPHEN 325 MG PO TABS: 650.0000 mg | ORAL_TABLET | ORAL | Status: DC | PRN

## 2024-03-27 MED ORDER — LACTATED RINGERS IV SOLN
INTRAVENOUS | Status: DC
  Administered 2024-03-27: 125 mL via INTRAVENOUS

## 2024-03-27 MED ORDER — LIDOCAINE HCL URETHRAL/MUCOSAL 2 % EX GEL
1.0000 | Freq: Once | CUTANEOUS | Status: AC
  Administered 2024-03-27: 1 via URETHRAL
  Filled 2024-03-27: qty 6

## 2024-03-27 MED ORDER — MIDAZOLAM HCL 2 MG/2ML IJ SOLN
INTRAMUSCULAR | Status: AC
  Filled 2024-03-27: qty 2

## 2024-03-27 MED ORDER — DOCUSATE SODIUM 100 MG PO CAPS
100.0000 mg | ORAL_CAPSULE | Freq: Every day | ORAL | Status: DC
  Administered 2024-03-28: 100 mg via ORAL
  Filled 2024-03-27: qty 1

## 2024-03-27 MED ORDER — CEFAZOLIN SODIUM-DEXTROSE 2-4 GM/100ML-% IV SOLN
INTRAVENOUS | Status: AC
  Filled 2024-03-27: qty 100

## 2024-03-27 MED ORDER — SUCCINYLCHOLINE CHLORIDE 200 MG/10ML IV SOSY
PREFILLED_SYRINGE | INTRAVENOUS | Status: DC | PRN
  Administered 2024-03-27: 100 mg via INTRAVENOUS

## 2024-03-27 MED ORDER — CALCIUM CARBONATE ANTACID 500 MG PO CHEW: 2.0000 | CHEWABLE_TABLET | ORAL | Status: DC | PRN

## 2024-03-27 MED ORDER — BORIC ACID 600 MG VA SUPP: 600.0000 mg | Freq: Two times a day (BID) | VAGINAL | 0 refills | Status: DC

## 2024-03-27 MED ORDER — SODIUM CHLORIDE 0.9 % IV SOLN
25.0000 mg | Freq: Four times a day (QID) | INTRAVENOUS | Status: DC | PRN
  Administered 2024-03-27 – 2024-03-28 (×2): 25 mg via INTRAVENOUS
  Filled 2024-03-27 (×3): qty 1

## 2024-03-27 MED ORDER — FENTANYL CITRATE (PF) 100 MCG/2ML IJ SOLN
INTRAMUSCULAR | Status: DC | PRN
  Administered 2024-03-27: 100 ug via INTRAVENOUS

## 2024-03-27 MED ORDER — PROPOFOL 10 MG/ML IV BOLUS
INTRAVENOUS | Status: DC | PRN
  Administered 2024-03-27: 200 mg via INTRAVENOUS

## 2024-03-27 MED ORDER — HYDROMORPHONE HCL 1 MG/ML IJ SOLN
2.0000 mg | INTRAMUSCULAR | Status: DC | PRN
  Administered 2024-03-27 – 2024-03-28 (×6): 2 mg via INTRAVENOUS
  Filled 2024-03-27 (×7): qty 2

## 2024-03-27 MED ORDER — ENOXAPARIN SODIUM 40 MG/0.4ML IJ SOSY
40.0000 mg | PREFILLED_SYRINGE | INTRAMUSCULAR | Status: DC
  Administered 2024-03-28: 40 mg via SUBCUTANEOUS
  Filled 2024-03-27 (×2): qty 0.4

## 2024-03-27 SURGICAL SUPPLY — 12 items
BAG DRAIN URO-CYSTO SKYTR STRL (DRAIN) ×1 IMPLANT
CATH URETL OPEN END 6FR 70 (CATHETERS) IMPLANT
GLOVE BIOGEL M 7.0 STRL (GLOVE) ×1 IMPLANT
GOWN STRL REUS W/TWL LRG LVL3 (GOWN DISPOSABLE) ×1 IMPLANT
GUIDEWIRE STR DUAL SENSOR (WIRE) ×2 IMPLANT
IV 0.9% NACL 1000 ML (IV SOLUTION) ×1 IMPLANT
KIT TURNOVER KIT B (KITS) IMPLANT
MANIFOLD NEPTUNE II (INSTRUMENTS) ×1 IMPLANT
PACK CYSTO (CUSTOM PROCEDURE TRAY) ×1 IMPLANT
STENT CONTOUR 6FRX24X.038 (STENTS) IMPLANT
SYR 10ML LL (SYRINGE) IMPLANT
TUBE CONNECTING 12X1/4 (SUCTIONS) ×1 IMPLANT

## 2024-03-27 NOTE — Anesthesia Preprocedure Evaluation (Signed)
 Anesthesia Evaluation  Patient identified by MRN, date of birth, ID band Patient awake    Reviewed: Allergy & Precautions, H&P , NPO status , Patient's Chart, lab work & pertinent test results  History of Anesthesia Complications Negative for: history of anesthetic complications  Airway Mallampati: II  TM Distance: >3 FB Neck ROM: Full    Dental no notable dental hx.    Pulmonary neg pulmonary ROS   Pulmonary exam normal breath sounds clear to auscultation       Cardiovascular negative cardio ROS Normal cardiovascular exam Rhythm:Regular Rate:Normal     Neuro/Psych Seizures -,  PSYCHIATRIC DISORDERS  Depression       GI/Hepatic Neg liver ROS,GERD  ,,  Endo/Other  negative endocrine ROS    Renal/GU negative Renal ROSUreter stone  negative genitourinary   Musculoskeletal negative musculoskeletal ROS (+)    Abdominal   Peds negative pediatric ROS (+)  Hematology negative hematology ROS (+)   Anesthesia Other Findings   Reproductive/Obstetrics (+) Pregnancy G5 P1-1-2-2 at 34 weeks 2 days                              Anesthesia Physical Anesthesia Plan  ASA: 3  Anesthesia Plan: General   Post-op Pain Management:    Induction: Intravenous and Rapid sequence  PONV Risk Score and Plan: 3  Airway Management Planned: Oral ETT and Video Laryngoscope Planned  Additional Equipment: None  Intra-op Plan:   Post-operative Plan: Extubation in OR  Informed Consent: I have reviewed the patients History and Physical, chart, labs and discussed the procedure including the risks, benefits and alternatives for the proposed anesthesia with the patient or authorized representative who has indicated his/her understanding and acceptance.     Dental advisory given  Plan Discussed with: CRNA  Anesthesia Plan Comments:          Anesthesia Quick Evaluation

## 2024-03-27 NOTE — Anesthesia Postprocedure Evaluation (Signed)
 Anesthesia Post Note  Patient: Alyssa Howell  Procedure(s) Performed: CYSTOSCOPY, WITH RETROGRADE PYELOGRAM AND URETERAL STENT INSERTION (Left)     Patient location during evaluation: PACU Anesthesia Type: General Level of consciousness: awake and alert Pain management: pain level controlled Vital Signs Assessment: post-procedure vital signs reviewed and stable Respiratory status: spontaneous breathing, nonlabored ventilation, respiratory function stable and patient connected to nasal cannula oxygen Cardiovascular status: blood pressure returned to baseline and stable Postop Assessment: no apparent nausea or vomiting Anesthetic complications: no   No notable events documented.  Last Vitals:  Vitals:   03/27/24 2230 03/27/24 2338  BP:  (!) 104/47  Pulse:  (!) 102  Resp:  18  Temp:  36.7 C  SpO2: 95% 97%    Last Pain:  Vitals:   03/27/24 2338  TempSrc: Oral  PainSc:                  Thom JONELLE Peoples

## 2024-03-27 NOTE — MAU Note (Signed)
 Alyssa Howell is a 23 y.o. at [redacted]w[redacted]d here in MAU reporting: know kidney stones reports in crease pain . Stated she has not been able to empty her bladder fully since yesterday.  Has take oxycodone  at 6;30 without relief. Has not been able to fill her Rx for dilaudid  .   LMP:  Onset of complaint: this morning Pain score: 10 Vitals:   03/27/24 0932  BP: 111/71  Pulse: (!) 126  Resp: 18  Temp: 98.9 F (37.2 C)     FHT: 136   Lab orders placed from triage: u/a

## 2024-03-27 NOTE — Discharge Instructions (Signed)
[facility] °[phone] °Post Ureteroscopy With or Without Stent Instructions ° °Definitions: ° °Ureter: The duct that transports urine from the kidney to the bladder. °Stent:   A plastic hollow tube that is placed into the ureter, from the kidney to the                 bladder to prevent the ureter from swelling shut. ° °GENERAL INSTRUCTIONS: ° °Despite the fact that no skin incisions were used, the area around the ureter and bladder is raw and irritated. The stent is a foreign body which will further irritate the bladder wall. This irritation is manifested by increased frequency of urination, both day and night, and by an increase in the urge to urinate. In some, the urge to urinate is present almost always. Sometimes the urge is strong enough that you may not be able to stop yourself from urinating. The only real cure is to remove the stent and then give time for the bladder wall to heal which can't be done until the danger of the ureter swelling shut has passed, which varies. ° °You may see some blood in your urine while the stent is in place and a few days afterwards. Do not be alarmed, even if the urine was clear for a while. Get off your feet and drink lots of fluids until clearing occurs. If you start to pass clots or don't improve, call us. ° °DIET: °You may return to your normal diet immediately. Because of the raw surface of your bladder, alcohol, spicy foods, acid type foods and drinks with caffeine may cause irritation or frequency and should be used in moderation. To keep your urine flowing freely and to avoid constipation, drink plenty of fluids during the day ( 8-10 glasses ). °Tip: Avoid cranberry juice because it is very acidic. ° °ACTIVITY: °Your physical activity doesn't need to be restricted. However, if you are very active, you may see some blood in your urine. We suggest that you reduce your activity under these circumstances until the bleeding has stopped. ° °BOWELS: °It is  important to keep your bowels regular during the postoperative period. Straining with bowel movements can cause bleeding. A bowel movement every other day is reasonable. Use a mild laxative if needed, such as Milk of Magnesia 2-3 tablespoons, or 2 Dulcolax tablets. Call if you continue to have problems. If you have been taking narcotics for pain, before, during or after your surgery, you may be constipated. Take a laxative if necessary. ° ° °MEDICATION: °You should resume your pre-surgery medications unless told not to. In addition you will often be given an antibiotic to prevent infection. These should be taken as prescribed until the bottles are finished unless you are having an unusual reaction to one of the drugs. ° °PROBLEMS YOU SHOULD REPORT TO US: °· Fevers over 100.5 Fahrenheit. °· Heavy bleeding, or clots ( See above notes about blood in urine ). °· Inability to urinate. °· Drug reactions ( hives, rash, nausea, vomiting, diarrhea ). °· Severe burning or pain with urination that is not improving. ° °FOLLOW-UP: °You will need a follow-up appointment to monitor your progress. Call for this appointment at the number listed above. Usually the first appointment will be about three to fourteen days after your surgery. ° ° ° ° ° °

## 2024-03-27 NOTE — MAU Note (Signed)
 Pt is unable to sit or lay down.  Unable to do conitnous FM at this time.Needs to move to help with pain. Notified provider and IV and pain meds ordered.

## 2024-03-27 NOTE — Transfer of Care (Signed)
 Immediate Anesthesia Transfer of Care Note  Patient: Alyssa Howell  Procedure(s) Performed: CYSTOSCOPY, WITH RETROGRADE PYELOGRAM AND URETERAL STENT INSERTION (Left)  Patient Location: PACU  Anesthesia Type:Regional  Level of Consciousness: drowsy  Airway & Oxygen Therapy: Patient Spontanous Breathing and Patient connected to nasal cannula oxygen  Post-op Assessment: Report given to RN, Post -op Vital signs reviewed and stable, and Patient moving all extremities  Post vital signs: Reviewed and stable  Last Vitals:  Vitals Value Taken Time  BP 108/46 03/27/24 21:15  Temp 36.5 C 03/27/24 21:15  Pulse 96 03/27/24 21:25  Resp 13 03/27/24 21:25  SpO2 94 % 03/27/24 21:25  Vitals shown include unfiled device data.  Last Pain:  Vitals:   03/27/24 2115  TempSrc:   PainSc: Asleep         Complications: No notable events documented.

## 2024-03-27 NOTE — Consult Note (Signed)
 Urology Consult   Reason for consult: Left ureteral stone with hydronephrosis and refractory pain  History of Present Illness: Alyssa Howell is a 23 y.o. who is G5 P1-1-2-2 at 34W 1D who presents with persistent nephrolithiasis.  She had a known nonobstructing left renal stone.  She has had persistent left-sided flank pain and CT scan 03/27/2024 reveals now 7 mm left UVJ stone as well as 2 mm left upper pole stone.  She denies fevers, chills, dysuria.  Urinalysis is unremarkable for infection.  She has had persistent significant pain that is not being managed well with oral medication.  Past Medical History:  Diagnosis Date   ADHD    Anemia    Chlamydia 2022   Depression    GERD (gastroesophageal reflux disease) 10/09/2019   HSV-2 infection    per pt/ seen in ED [facility]   Kidney stones    Seizures (HCC)    x 1 in 5th grade after taking ability. No other episodes.   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   Vaginal pain at introitus  Referred to Pelvic Floor PT      Past Surgical History:  Procedure Laterality Date   CYSTOSCOPY/URETEROSCOPY/HOLMIUM LASER/STENT PLACEMENT Right 08/02/2020   Procedure: CYSTOSCOPY/URETEROSCOPY/HOLMIUM LASER/STENT PLACEMENT;  Surgeon: Penne Knee, MD;  Location: [facility] ORS;  Service: Urology;  Laterality: Right;   DILATION AND CURETTAGE OF UTERUS N/A 08/12/2022   Procedure: DILATATION AND CURETTAGE, ULTRASOUND GUIDED;  Surgeon: Zina Jerilynn LABOR, MD;  Location: [facility] OR;  Service: Gynecology;  Laterality: N/A;   FOOT SURGERY Bilateral    metal screws in toes,2020   TONSILLECTOMY       Current Hospital Medications:  Home meds:  No current facility-administered medications on file prior to encounter.   Current Outpatient Medications on File Prior to Encounter  Medication Sig Dispense Refill   acetaminophen  (TYLENOL ) 325 MG tablet Take 650 mg by mouth every 4 (four) hours as needed for mild pain (pain score 1-3) or  moderate pain (pain score 4-6).     acetaminophen -caffeine  (EXCEDRIN TENSION HEADACHE) 500-65 MG TABS per tablet Take 2 tablets by mouth 4 (four) times daily as needed (For headaches). 60 tablet 0   cyclobenzaprine  (FLEXERIL ) 10 MG tablet Take 0.5-1 tablets (5-10 mg total) by mouth 3 (three) times daily as needed for muscle spasms. 15 tablet 0   famotidine  (PEPCID ) 40 MG tablet Take 1 tablet (40 mg total) by mouth daily. 90 tablet 3   ferrous sulfate  325 (65 FE) MG tablet Take 1 tablet (325 mg total) by mouth daily. 30 tablet 0   HYDROmorphone  (DILAUDID ) 2 MG tablet Take 1 tablet (2 mg total) by mouth every 4 (four) hours as needed for severe pain (pain score 7-10). 30 tablet 0   ondansetron  (ZOFRAN -ODT) 8 MG disintegrating tablet Take 1 tablet (8 mg total) by mouth every 8 (eight) hours as needed for nausea or vomiting. 20 tablet 0   Prenatal Vit-Fe Phos-FA-Omega (VITAFOL  GUMMIES) 3.33-0.333-34.8 MG CHEW Chew 3 tablets by mouth daily. 90 tablet 11   promethazine  (PHENERGAN ) 25 MG tablet Take 1 tablet (25 mg total) by mouth every 6 (six) hours as needed for nausea or vomiting. 30 tablet 0   tamsulosin  (FLOMAX ) 0.4 MG CAPS capsule Take 1 capsule (0.4 mg total) by mouth daily. 30 capsule 2   terconazole  (TERAZOL 7 ) 0.4 % vaginal cream Place 1 applicator vaginally at bedtime. Use for seven days 45 g 0   valACYclovir  (VALTREX ) 500 MG tablet  Take 1 tablet (500 mg total) by mouth 2 (two) times daily. 60 tablet 4     Scheduled Meds: Continuous Infusions:  lactated ringers  125 mL (03/27/24 1430)   PRN Meds:.HYDROmorphone   Allergies:  Allergies  Allergen Reactions   Abilify [Aripiprazole] Other (See Comments)    Seizures   Metronidazole  Hives and Other (See Comments)    Inguinal area    Family History  Problem Relation Age of Onset   Hepatitis C Mother    Valvular heart disease Mother    Other Father        no contact in over 58yrs   Cancer Maternal Grandmother    Breast cancer Maternal  Grandmother    Diabetes Other    Heart attack Other    Asthma Neg Hx    Heart disease Neg Hx    Hypertension Neg Hx     Social History:  reports that she has never smoked. She has never used smokeless tobacco. She reports that she does not currently use alcohol. She reports that she does not currently use drugs after having used the following drugs: Marijuana.  ROS: A complete review of systems was performed.  All systems are negative except for pertinent findings as noted.  Physical Exam:  Vital signs in last 24 hours: Temp:  [98.9 F (37.2 C)] 98.9 F (37.2 C) (12/04 0932) Pulse Rate:  [109-126] 109 (12/04 1228) Resp:  [18] 18 (12/04 0932) BP: (111-128)/(71-75) 128/75 (12/04 1228) Constitutional:  Alert and oriented, No acute distress Cardiovascular: Regular rate and rhythm Respiratory: Normal respiratory effort, Lungs clear bilaterally GI: Abdomen is soft, nontender, nondistended, no abdominal masses GU: No CVA tenderness Neurologic: Grossly intact, no focal deficits Psychiatric: Normal mood and affect  Laboratory Data:  Recent Labs    03/27/24 1053  WBC 13.1*  HGB 10.3*  HCT 32.8*  PLT 272    Recent Labs    03/27/24 1053  NA 136  K 3.8  CL 107  GLUCOSE 90  BUN 10  CALCIUM 8.6*  CREATININE 0.83     Results for orders placed or performed during the hospital encounter of 03/27/24 (from the past 24 hours)  CBC     Status: Abnormal   Collection Time: 03/27/24 10:53 AM  Result Value Ref Range   WBC 13.1 (H) 4.0 - 10.5 K/uL   RBC 3.36 (L) 3.87 - 5.11 MIL/uL   Hemoglobin 10.3 (L) 12.0 - 15.0 g/dL   HCT 67.1 (L) 63.9 - 53.9 %   MCV 97.6 80.0 - 100.0 fL   MCH 30.7 26.0 - 34.0 pg   MCHC 31.4 30.0 - 36.0 g/dL   RDW 84.0 (H) 88.4 - 84.4 %   Platelets 272 150 - 400 K/uL   nRBC 0.0 0.0 - 0.2 %  Comprehensive metabolic panel     Status: Abnormal   Collection Time: 03/27/24 10:53 AM  Result Value Ref Range   Sodium 136 135 - 145 mmol/L   Potassium 3.8 3.5 -  5.1 mmol/L   Chloride 107 98 - 111 mmol/L   CO2 22 22 - 32 mmol/L   Glucose, Bld 90 70 - 99 mg/dL   BUN 10 6 - 20 mg/dL   Creatinine, Ser 9.16 0.44 - 1.00 mg/dL   Calcium 8.6 (L) 8.9 - 10.3 mg/dL   Total Protein 6.6 6.5 - 8.1 g/dL   Albumin  2.7 (L) 3.5 - 5.0 g/dL   AST 22 15 - 41 U/L   ALT 37 0 - 44 U/L  Alkaline Phosphatase 147 (H) 38 - 126 U/L   Total Bilirubin 0.5 0.0 - 1.2 mg/dL   GFR, Estimated >39 >39 mL/min   Anion gap 7 5 - 15  Urinalysis, Routine w reflex microscopic -Urine, Catheterized     Status: Abnormal   Collection Time: 03/27/24 10:53 AM  Result Value Ref Range   Color, Urine AMBER (A) YELLOW   APPearance CLOUDY (A) CLEAR   Specific Gravity, Urine >1.030 (H) 1.005 - 1.030   pH 6.0 5.0 - 8.0   Glucose, UA NEGATIVE NEGATIVE mg/dL   Hgb urine dipstick LARGE (A) NEGATIVE   Bilirubin Urine SMALL (A) NEGATIVE   Ketones, ur NEGATIVE NEGATIVE mg/dL   Protein, ur 899 (A) NEGATIVE mg/dL   Nitrite NEGATIVE NEGATIVE   Leukocytes,Ua NEGATIVE NEGATIVE  Urinalysis, Microscopic (reflex)     Status: Abnormal   Collection Time: 03/27/24 10:53 AM  Result Value Ref Range   RBC / HPF 21-50 0 - 5 RBC/hpf   WBC, UA 0-5 0 - 5 WBC/hpf   Bacteria, UA MANY (A) NONE SEEN   Squamous Epithelial / HPF 11-20 0 - 5 /HPF   Non Squamous Epithelial PRESENT (A) NONE SEEN   Mucus PRESENT    No results found for this or any previous visit (from the past 240 hours).  Renal Function: Recent Labs    03/27/24 1053  CREATININE 0.83   Estimated Creatinine Clearance: 127.6 mL/min (by C-G formula based on SCr of 0.83 mg/dL).  Radiologic Imaging: CT Renal Stone Study Result Date: 03/27/2024 CLINICAL DATA:  Abdominal/flank pain. Patient is pregnant. Suspect renal stone. EXAM: CT ABDOMEN AND PELVIS WITHOUT CONTRAST TECHNIQUE: Multidetector CT imaging of the abdomen and pelvis was performed following the standard protocol without IV contrast. RADIATION DOSE REDUCTION: This exam was performed  according to the departmental dose-optimization program which includes automated exposure control, adjustment of the mA and/or kV according to patient size and/or use of iterative reconstruction technique. COMPARISON:  03/09/2024 FINDINGS: Lower chest: Heart is normal in size. Visualized lung bases are clear. Hepatobiliary: Liver, gallbladder and biliary tree are normal. Pancreas: Normal. Spleen: Normal. Adrenals/Urinary Tract: Adrenal glands are normal. Kidneys are normal in size. 2 mm stone over the upper pole collecting system of the left kidney unchanged. Interval development of mild left-sided hydronephrosis. The previously seen 7 mm upper pole left renal stone has now moved to the region of the distal left ureter just above the UVJ. No right-sided renal stones. Right ureter is normal. Bladder is unremarkable. Stomach/Bowel: Stomach and small bowel are normal. Appendix is normal. Colon is normal. Vascular/Lymphatic: No significant vascular findings are present. No enlarged abdominal or pelvic lymph nodes. Reproductive: Gravid uterus with fetus in cephalic presentation compatible patient's known pregnancy. Other: No free peritoneal air fluid. Musculoskeletal: No focal abnormality. IMPRESSION: 1. Interval development of mild left-sided hydronephrosis. The previously seen 7 mm upper pole left renal stone has now moved to the region of the distal left ureter just above the UVJ. 2. 2 mm nonobstructing upper pole left renal stone unchanged. 3. Gravid uterus with fetus in cephalic presentation compatible patient's known pregnancy. Electronically Signed   By: Toribio Agreste M.D.   On: 03/27/2024 15:16    I independently reviewed the above imaging studies.  Impression/Recommendation: Left ureteral stone in setting of pregnancy:  -CT scan reviewed today with 7 mm distal left ureteral stone with mild hydronephrosis.  She is at [redacted] weeks gestation.  Discussed option of left ureteral stent versus nephrostomy tube.  She would like to proceed with ureteral stent.  Discussed risks and benefits including risk of infection, persistent pain, frequency, urgency, gross hematuria, induction of labor, ureteral injury. - Given that she is at 34 weeks, I think that this can be managed with stenting presently and definitive treatment of her stone after she delivers.  Will arrange for close follow-up with my office.  -The risks, benefits and alternatives of cystoscopy with left JJ stent placement was discussed with the patient.  Risks include, but are not limited to: bleeding, urinary tract infection, ureteral injury, ureteral stricture disease, chronic pain, urinary symptoms, bladder injury, stent migration, the need for nephrostomy tube placement, MI, CVA, DVT, PE and the inherent risks with general anesthesia.  The patient voices understanding and wishes to proceed.   Matt R. Colson Barco MD 03/27/2024, 3:51 PM  [facility]  Pager: [phone]

## 2024-03-27 NOTE — H&P (Signed)
 History    CSN: [id number]   Arrival date and time: 03/27/24 9085    None         Chief Complaint  Patient presents with   Abdominal Pain   Back Pain    HPI Patient is a 23 year old G5 P1-1-2-2 at 34 weeks 2 days presenting to the MAU for severe abdominal pain above her pubic bone and in her back.  She reports she has not been able to empty her bladder since yesterday.  She has known kidney stones and has a prescription for oxycodone  which she took at 630 without any relief.  She has a prescription for Dilaudid  at her pharmacy but has been unable to fill it.   OB History       Gravida  5   Para  2   Term  1   Preterm  1   AB  2   Living  2        SAB  1   IAB  1   Ectopic  0   Multiple  0   Live Births  2                   Past Medical History:  Diagnosis Date   ADHD     Anemia     Chlamydia 2022   Depression     GERD (gastroesophageal reflux disease) 10/09/2019   HSV-2 infection      per pt/ seen in ED [facility]   Kidney stones     Seizures (HCC)      x 1 in 5th grade after taking ability. No other episodes.   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    Vaginal pain at introitus  Referred to Pelvic Floor PT                 Past Surgical History:  Procedure Laterality Date   CYSTOSCOPY/URETEROSCOPY/HOLMIUM LASER/STENT PLACEMENT Right 08/02/2020    Procedure: CYSTOSCOPY/URETEROSCOPY/HOLMIUM LASER/STENT PLACEMENT;  Surgeon: Penne Knee, MD;  Location: [facility] ORS;  Service: Urology;  Laterality: Right;   DILATION AND CURETTAGE OF UTERUS N/A 08/12/2022    Procedure: DILATATION AND CURETTAGE, ULTRASOUND GUIDED;  Surgeon: Zina Jerilynn LABOR, MD;  Location: [facility] OR;  Service: Gynecology;  Laterality: N/A;   FOOT SURGERY Bilateral      metal screws in toes,2020   TONSILLECTOMY                   Family History  Problem Relation Age of Onset   Hepatitis C Mother     Valvular heart disease Mother     Other Father           no contact in over 72yrs   Cancer Maternal Grandmother     Breast cancer Maternal Grandmother     Diabetes Other     Heart attack Other     Asthma Neg Hx     Heart disease Neg Hx     Hypertension Neg Hx            Social History  Social History         Tobacco Use   Smoking status: Never   Smokeless tobacco: Never  Vaping Use   Vaping status: Never Used  Substance Use Topics   Alcohol use: Not Currently   Drug use: Not Currently      Types: Marijuana      Comment: last used the end of September 2022  as of 02/07/2021        Allergies:  Allergies       Allergies  Allergen Reactions   Abilify [Aripiprazole] Other (See Comments)      Seizures   Metronidazole  Hives and Other (See Comments)      Inguinal area               Medications Prior to Admission  Medication Sig Dispense Refill Last Dose/Taking   acetaminophen  (TYLENOL ) 325 MG tablet Take 650 mg by mouth every 4 (four) hours as needed for mild pain (pain score 1-3) or moderate pain (pain score 4-6).         acetaminophen -caffeine  (EXCEDRIN TENSION HEADACHE) 500-65 MG TABS per tablet Take 2 tablets by mouth 4 (four) times daily as needed (For headaches). 60 tablet 0     cyclobenzaprine  (FLEXERIL ) 10 MG tablet Take 0.5-1 tablets (5-10 mg total) by mouth 3 (three) times daily as needed for muscle spasms. 15 tablet 0     famotidine  (PEPCID ) 40 MG tablet Take 1 tablet (40 mg total) by mouth daily. 90 tablet 3     ferrous sulfate  325 (65 FE) MG tablet Take 1 tablet (325 mg total) by mouth daily. 30 tablet 0     HYDROmorphone  (DILAUDID ) 2 MG tablet Take 1 tablet (2 mg total) by mouth every 4 (four) hours as needed for severe pain (pain score 7-10). 30 tablet 0     ondansetron  (ZOFRAN -ODT) 8 MG disintegrating tablet Take 1 tablet (8 mg total) by mouth every 8 (eight) hours as needed for nausea or vomiting. 20 tablet 0     Prenatal Vit-Fe Phos-FA-Omega (VITAFOL  GUMMIES) 3.33-0.333-34.8 MG CHEW Chew 3 tablets by  mouth daily. 90 tablet 11     promethazine  (PHENERGAN ) 25 MG tablet Take 1 tablet (25 mg total) by mouth every 6 (six) hours as needed for nausea or vomiting. 30 tablet 0     tamsulosin  (FLOMAX ) 0.4 MG CAPS capsule Take 1 capsule (0.4 mg total) by mouth daily. 30 capsule 2     terconazole  (TERAZOL 7 ) 0.4 % vaginal cream Place 1 applicator vaginally at bedtime. Use for seven days 45 g 0     valACYclovir  (VALTREX ) 500 MG tablet Take 1 tablet (500 mg total) by mouth 2 (two) times daily. 60 tablet 4            Review of Systems  Gastrointestinal:  Positive for abdominal pain, nausea and vomiting.  Genitourinary:  Positive for difficulty urinating.  Musculoskeletal:  Positive for back pain.   Physical Exam    Blood pressure 111/71, pulse (!) 126, temperature 98.9 F (37.2 C), resp. rate 18, last menstrual period 06/21/2023, not currently breastfeeding.   Physical Exam Constitutional:      General: She is in acute distress.  Cardiovascular:     Rate and Rhythm: Regular rhythm. Tachycardia present.  Pulmonary:     Effort: Pulmonary effort is normal.  Abdominal:     Palpations: Abdomen is soft.     Tenderness: There is abdominal tenderness in the suprapubic area.     Comments: Gravid  Skin:    General: Skin is warm.  Neurological:     General: No focal deficit present.     Mental Status: She is alert.       MAU Course  Procedures   MDM CBC  CMP CT scan Urology consult     Assessment and Plan  Alyssa Howell is a 23 year old G5 P1-1-2-2 at 34 weeks 2  days presenting for severe abdominal pain.   Kidney stones Patient with long history of kidney stones.  Previous CT showing 7 mm stone in left renal pelvis.  Scan today showing interval development of mild left-sided hydronephrosis with a 7 mm stone moving into the distal left ureter just above the UVJ.  CBC showing elevated white count at 13.1, urinalysis showing small bilirubin, large hemoglobin, specific gravity of 1.03.   Due to the severe pain patient prescribed 2 mg Dilaudid  every 2 hours as needed.  This is able to keep the pain under control during the 2 hours but as soon as she gets close to that 2-hour mark she starts having severe pain again and needs another dose.  Consulted urology are going to come and evaluate the patient and likely take her for possible stent placement.  Patient will remain in observation on OB specially care after the procedure and likely be discharged tomorrow.   Alyssa Howell V Jshon Ibe

## 2024-03-27 NOTE — Anesthesia Procedure Notes (Signed)
 Procedure Name: Intubation Date/Time: 03/27/2024 8:35 PM  Performed by: Clorinda Wyble T, CRNAPre-anesthesia Checklist: Patient identified, Emergency Drugs available, Suction available and Patient being monitored Patient Re-evaluated:Patient Re-evaluated prior to induction Oxygen Delivery Method: Circle system utilized Preoxygenation: Pre-oxygenation with 100% oxygen Induction Type: IV induction, Rapid sequence and Cricoid Pressure applied Ventilation: Mask ventilation without difficulty Laryngoscope Size: Mac and 4 Grade View: Grade I Tube type: Oral Tube size: 7.0 mm Number of attempts: 1 Airway Equipment and Method: Stylet and Oral airway Placement Confirmation: ETT inserted through vocal cords under direct vision, positive ETCO2 and breath sounds checked- equal and bilateral Secured at: 22 cm Tube secured with: Tape Dental Injury: Teeth and Oropharynx as per pre-operative assessment

## 2024-03-27 NOTE — Op Note (Signed)
 Operative Note  Preoperative diagnosis:  1.  Left ureteral stone with obstruction  Postoperative diagnosis: 1.  Left ureteral stone with obstruction  Procedure(s): 1.  Cystoscopy 2. Left retrograde pyelogram with interpretation 3. Left ureteral stent placement 4. Fluoroscopy <1 hour with intraoperative interpretation  Surgeon: Donnice Siad, MD  Assistants:  None  Anesthesia:  General  Complications:  None  EBL:  Minimal  Specimens: 1. none  Drains/Catheters: 1.  6Fr x 24cm ureteral stent  Intraoperative findings:   Cystoscopy demonstrated no suspicious lesions, masses, stones or other pathology. Left retrograde pyelogram demonstrated moderate left hydronephrosis. Successful left ureteral stent placement with curl in the renal pelvis and bladder respectively.  Indication:  Alyssa Howell is a 23 y.o. female with obstructing left distal ureteral stone with refractory pain. After reviewing the management options for treatment, she elected to proceed with the above surgical procedure(s). We have discussed the potential benefits and risks of the procedure, side effects of the proposed treatment, the likelihood of the patient achieving the goals of the procedure, and any potential problems that might occur during the procedure or recuperation. Informed consent has been obtained.  Description of procedure: The patient was taken to the operating room and general anesthesia was induced.  The patient was placed in the dorsal lithotomy position, prepped and draped in the usual sterile fashion, and preoperative antibiotics were administered. A preoperative time-out was performed.   Cystourethroscopy was performed.  The patient's urethra was examined and was normal. The bladder was then systematically examined in its entirety. There was no evidence for any bladder tumors, stones, or other mucosal pathology.    Attention then turned to the left ureteral orifice. A 0.038 zip wire was passed  through the left orifice and over the wire a 5 Fr open ended catheter was inserted and passed up to the level of the renal pelvis. Omnipaque  contrast was injected through the ureteral catheter and a retrograde pyelogram was performed with findings as dictated above. The wire was then replaced and the open ended catheter was removed.   A 6Fr x 24 cm ureteral stent was advance over the wire. The stent was positioned appropriately under fluoroscopic and cystoscopic guidance.  The wire was then removed with an adequate stent curl noted in the renal pelvis as well as in the bladder.  The bladder was then emptied and the procedure ended.  The patient appeared to tolerate the procedure well and without complications.  The patient was able to be awakened and transferred to the recovery unit in satisfactory condition.   Matt R. Aubrei Bouchie MD [facility]  Pager: [phone]

## 2024-03-27 NOTE — MAU Provider Note (Signed)
 History     CSN: [id number]  Arrival date and time: 03/27/24 9085   None     Chief Complaint  Patient presents with   Abdominal Pain   Back Pain   HPI Patient is a 23 year old G5 P1-1-2-2 at 34 weeks 2 days presenting to the MAU for severe abdominal pain above her pubic bone and in her back.  She reports she has not been able to empty her bladder since yesterday.  She has known kidney stones and has a prescription for oxycodone  which she took at 630 without any relief.  She has a prescription for Dilaudid  at her pharmacy but has been unable to fill it.  OB History     Gravida  5   Para  2   Term  1   Preterm  1   AB  2   Living  2      SAB  1   IAB  1   Ectopic  0   Multiple  0   Live Births  2           Past Medical History:  Diagnosis Date   ADHD    Anemia    Chlamydia 2022   Depression    GERD (gastroesophageal reflux disease) 10/09/2019   HSV-2 infection    per pt/ seen in ED [facility]   Kidney stones    Seizures (HCC)    x 1 in 5th grade after taking ability. No other episodes.   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   Vaginal pain at introitus  Referred to Pelvic Floor PT      Past Surgical History:  Procedure Laterality Date   CYSTOSCOPY/URETEROSCOPY/HOLMIUM LASER/STENT PLACEMENT Right 08/02/2020   Procedure: CYSTOSCOPY/URETEROSCOPY/HOLMIUM LASER/STENT PLACEMENT;  Surgeon: Penne Knee, MD;  Location: [facility] ORS;  Service: Urology;  Laterality: Right;   DILATION AND CURETTAGE OF UTERUS N/A 08/12/2022   Procedure: DILATATION AND CURETTAGE, ULTRASOUND GUIDED;  Surgeon: Zina Jerilynn LABOR, MD;  Location: [facility] OR;  Service: Gynecology;  Laterality: N/A;   FOOT SURGERY Bilateral    metal screws in toes,2020   TONSILLECTOMY      Family History  Problem Relation Age of Onset   Hepatitis C Mother    Valvular heart disease Mother    Other Father        no contact in over 75yrs   Cancer Maternal Grandmother     Breast cancer Maternal Grandmother    Diabetes Other    Heart attack Other    Asthma Neg Hx    Heart disease Neg Hx    Hypertension Neg Hx     Social History   Tobacco Use   Smoking status: Never   Smokeless tobacco: Never  Vaping Use   Vaping status: Never Used  Substance Use Topics   Alcohol use: Not Currently   Drug use: Not Currently    Types: Marijuana    Comment: last used the end of September 2022 as of 02/07/2021    Allergies:  Allergies  Allergen Reactions   Abilify [Aripiprazole] Other (See Comments)    Seizures   Metronidazole  Hives and Other (See Comments)    Inguinal area    Medications Prior to Admission  Medication Sig Dispense Refill Last Dose/Taking   acetaminophen  (TYLENOL ) 325 MG tablet Take 650 mg by mouth every 4 (four) hours as needed for mild pain (pain score 1-3) or moderate pain (pain score 4-6).      acetaminophen -caffeine  (EXCEDRIN  TENSION HEADACHE) 500-65 MG TABS per tablet Take 2 tablets by mouth 4 (four) times daily as needed (For headaches). 60 tablet 0    cyclobenzaprine  (FLEXERIL ) 10 MG tablet Take 0.5-1 tablets (5-10 mg total) by mouth 3 (three) times daily as needed for muscle spasms. 15 tablet 0    famotidine  (PEPCID ) 40 MG tablet Take 1 tablet (40 mg total) by mouth daily. 90 tablet 3    ferrous sulfate  325 (65 FE) MG tablet Take 1 tablet (325 mg total) by mouth daily. 30 tablet 0    HYDROmorphone  (DILAUDID ) 2 MG tablet Take 1 tablet (2 mg total) by mouth every 4 (four) hours as needed for severe pain (pain score 7-10). 30 tablet 0    ondansetron  (ZOFRAN -ODT) 8 MG disintegrating tablet Take 1 tablet (8 mg total) by mouth every 8 (eight) hours as needed for nausea or vomiting. 20 tablet 0    Prenatal Vit-Fe Phos-FA-Omega (VITAFOL  GUMMIES) 3.33-0.333-34.8 MG CHEW Chew 3 tablets by mouth daily. 90 tablet 11    promethazine  (PHENERGAN ) 25 MG tablet Take 1 tablet (25 mg total) by mouth every 6 (six) hours as needed for nausea or vomiting. 30  tablet 0    tamsulosin  (FLOMAX ) 0.4 MG CAPS capsule Take 1 capsule (0.4 mg total) by mouth daily. 30 capsule 2    terconazole  (TERAZOL 7 ) 0.4 % vaginal cream Place 1 applicator vaginally at bedtime. Use for seven days 45 g 0    valACYclovir  (VALTREX ) 500 MG tablet Take 1 tablet (500 mg total) by mouth 2 (two) times daily. 60 tablet 4     Review of Systems  Gastrointestinal:  Positive for abdominal pain, nausea and vomiting.  Genitourinary:  Positive for difficulty urinating.  Musculoskeletal:  Positive for back pain.   Physical Exam   Blood pressure 111/71, pulse (!) 126, temperature 98.9 F (37.2 C), resp. rate 18, last menstrual period 06/21/2023, not currently breastfeeding.  Physical Exam Constitutional:      General: She is in acute distress.  Cardiovascular:     Rate and Rhythm: Regular rhythm. Tachycardia present.  Pulmonary:     Effort: Pulmonary effort is normal.  Abdominal:     Palpations: Abdomen is soft.     Tenderness: There is abdominal tenderness in the suprapubic area.     Comments: Gravid  Skin:    General: Skin is warm.  Neurological:     General: No focal deficit present.     Mental Status: She is alert.     MAU Course  Procedures  MDM CBC  CMP CT scan Urology consult   Assessment and Plan  Loreta Mirante is a 23 year old G5 P1-1-2-2 at 34 weeks 2 days presenting for severe abdominal pain.  Kidney stones Patient with long history of kidney stones.  Previous CT showing 7 mm stone in left renal pelvis.  Scan today showing interval development of mild left-sided hydronephrosis with a 7 mm stone moving into the distal left ureter just above the UVJ.  CBC showing elevated white count at 13.1, urinalysis showing small bilirubin, large hemoglobin, specific gravity of 1.03.  Due to the severe pain patient prescribed 2 mg Dilaudid  every 2 hours as needed.  This is able to keep the pain under control during the 2 hours but as soon as she gets close to that  2-hour mark she starts having severe pain again and needs another dose.  Consulted urology are going to come and evaluate the patient and likely take her for possible stent  placement.  Patient will remain in observation on OB specially care after the procedure and likely be discharged tomorrow.  Alaria Oconnor V Luisfelipe Engelstad 03/27/2024, 10:36 AM

## 2024-03-27 NOTE — Progress Notes (Signed)
 Seeing pt post-op for NST. She is G5P2 at [redacted]w[redacted]d here for c/o severe pain r/t Left ureteral stone obstrution, post-ureteroscopy. Updated Dr Alger on FHT, ok to complete NST since pt will be on continuous EFM once back on OBSC.

## 2024-03-27 NOTE — Progress Notes (Signed)
 Called by OR RN regarding pt being 34wks coming to [facility] for scheduled surgery.

## 2024-03-28 ENCOUNTER — Encounter (HOSPITAL_COMMUNITY): Payer: Self-pay | Admitting: Urology

## 2024-03-28 DIAGNOSIS — N200 Calculus of kidney: Secondary | ICD-10-CM | POA: Diagnosis not present

## 2024-03-28 DIAGNOSIS — A599 Trichomoniasis, unspecified: Secondary | ICD-10-CM | POA: Diagnosis present

## 2024-03-28 DIAGNOSIS — Z3A34 34 weeks gestation of pregnancy: Secondary | ICD-10-CM

## 2024-03-28 DIAGNOSIS — O26893 Other specified pregnancy related conditions, third trimester: Secondary | ICD-10-CM | POA: Diagnosis not present

## 2024-03-28 NOTE — Discharge Summary (Signed)
 Antenatal Physician Discharge Summary  Patient ID: Alyssa Howell MRN: [id number] DOB/AGE: 15-Jun-2000 23 y.o.  Admit date: 03/27/2024 Discharge date: 03/28/2024  Admission Diagnoses:Principal Problem:   Renal calculi Active Problems:   Herpes simplex vulvovaginitis   Trichomoniasis   [redacted] weeks gestation of pregnancy    Discharge Diagnoses: Principal Problem:   Renal calculi Active Problems:   Herpes simplex vulvovaginitis   Trichomoniasis   [redacted] weeks gestation of pregnancy    Prenatal Procedures: Cystoscopy, left retrograde Pyelogram, left ureteral stent placement  Consults: Urology  Hospital Course:  Jenise Malinowski is a 23 y.o. [id number] with IUP at [redacted]w[redacted]d admitted for the above procedure.  She was admitted postoperatively for pain control and stabilization.  She was observed, fetal heart rate monitoring remained reassuring, and she had no signs/symptoms of progressing preterm labor or other maternal-fetal concerns.  Her pain was markedly improved.  She was deemed stable for discharge to home with outpatient follow up.  Patient has follow-up in 4 days in the office.  She knows to reach out with any new problems.  She will be instructed to take over-the-counter boric acid suppositories to treat her trichomonas given her metronidazole  allergy.  Discharge Exam: Temp:  [97.7 F (36.5 C)-98.4 F (36.9 C)] 98.4 F (36.9 C) (12/05 0735) Pulse Rate:  [91-116] 110 (12/05 0748) Resp:  [12-18] 18 (12/05 0735) BP: (92-128)/(39-78) 101/46 (12/05 0748) SpO2:  [91 %-100 %] 95 % (12/05 0735) Physical Examination: CONSTITUTIONAL: Well-developed, well-nourished female in no acute distress.  HENT:  Normocephalic, atraumatic, External right and left ear normal.  EYES: Conjunctivae and EOM are normal.  No scleral icterus.  NECK: Normal range of motion, supple, no masses SKIN: Skin is warm and dry. No rash noted. Not diaphoretic. No erythema. No pallor. NEUROLOGIC: Alert and oriented to person,  place, and time.  CARDIOVASCULAR: Normal heart rate noted, regular rhythm RESPIRATORY: Effort normal, no problems with respiration noted ABDOMEN: Soft, nontender, nondistended, gravid.   Fetal monitoring: FHR: 120 bpm, Variability: moderate, Accelerations: Present, Decelerations: Absent  Uterine activity: Quiet   Significant Diagnostic Studies:  Results for orders placed or performed during the hospital encounter of 03/27/24 (from the past week)  CBC   Collection Time: 03/27/24 10:53 AM  Result Value Ref Range   WBC 13.1 (H) 4.0 - 10.5 K/uL   RBC 3.36 (L) 3.87 - 5.11 MIL/uL   Hemoglobin 10.3 (L) 12.0 - 15.0 g/dL   HCT 67.1 (L) 63.9 - 53.9 %   MCV 97.6 80.0 - 100.0 fL   MCH 30.7 26.0 - 34.0 pg   MCHC 31.4 30.0 - 36.0 g/dL   RDW 84.0 (H) 88.4 - 84.4 %   Platelets 272 150 - 400 K/uL   nRBC 0.0 0.0 - 0.2 %  Comprehensive metabolic panel   Collection Time: 03/27/24 10:53 AM  Result Value Ref Range   Sodium 136 135 - 145 mmol/L   Potassium 3.8 3.5 - 5.1 mmol/L   Chloride 107 98 - 111 mmol/L   CO2 22 22 - 32 mmol/L   Glucose, Bld 90 70 - 99 mg/dL   BUN 10 6 - 20 mg/dL   Creatinine, Ser 9.16 0.44 - 1.00 mg/dL   Calcium  8.6 (L) 8.9 - 10.3 mg/dL   Total Protein 6.6 6.5 - 8.1 g/dL   Albumin  2.7 (L) 3.5 - 5.0 g/dL   AST 22 15 - 41 U/L   ALT 37 0 - 44 U/L   Alkaline Phosphatase 147 (H) 38 - 126 U/L  Total Bilirubin 0.5 0.0 - 1.2 mg/dL   GFR, Estimated >39 >39 mL/min   Anion gap 7 5 - 15  Urinalysis, Routine w reflex microscopic -Urine, Catheterized   Collection Time: 03/27/24 10:53 AM  Result Value Ref Range   Color, Urine AMBER (A) YELLOW   APPearance CLOUDY (A) CLEAR   Specific Gravity, Urine >1.030 (H) 1.005 - 1.030   pH 6.0 5.0 - 8.0   Glucose, UA NEGATIVE NEGATIVE mg/dL   Hgb urine dipstick LARGE (A) NEGATIVE   Bilirubin Urine SMALL (A) NEGATIVE   Ketones, ur NEGATIVE NEGATIVE mg/dL   Protein, ur 899 (A) NEGATIVE mg/dL   Nitrite NEGATIVE NEGATIVE   Leukocytes,Ua  NEGATIVE NEGATIVE  Urinalysis, Microscopic (reflex)   Collection Time: 03/27/24 10:53 AM  Result Value Ref Range   RBC / HPF 21-50 0 - 5 RBC/hpf   WBC, UA 0-5 0 - 5 WBC/hpf   Bacteria, UA MANY (A) NONE SEEN   Squamous Epithelial / HPF 11-20 0 - 5 /HPF   Non Squamous Epithelial PRESENT (A) NONE SEEN   Mucus PRESENT   Type and screen [facility]   Collection Time: 03/27/24  5:21 PM  Result Value Ref Range   ABO/RH(D) A POS    Antibody Screen NEG    Sample Expiration      03/30/2024,2359 Performed at [facility], [address], [place], [place] [address]   CBC   Collection Time: 03/27/24  5:24 PM  Result Value Ref Range   WBC 13.8 (H) 4.0 - 10.5 K/uL   RBC 3.16 (L) 3.87 - 5.11 MIL/uL   Hemoglobin 9.7 (L) 12.0 - 15.0 g/dL   HCT 69.2 (L) 63.9 - 53.9 %   MCV 97.2 80.0 - 100.0 fL   MCH 30.7 26.0 - 34.0 pg   MCHC 31.6 30.0 - 36.0 g/dL   RDW 84.1 (H) 88.4 - 84.4 %   Platelets 249 150 - 400 K/uL   nRBC 0.0 0.0 - 0.2 %  Creatinine, serum   Collection Time: 03/27/24  5:24 PM  Result Value Ref Range   Creatinine, Ser 0.73 0.44 - 1.00 mg/dL   GFR, Estimated >39 >39 mL/min  Results for orders placed or performed in visit on 03/26/24 (from the past week)  Cervicovaginal ancillary only   Collection Time: 03/26/24  2:53 PM  Result Value Ref Range   Neisseria Gonorrhea Negative    Chlamydia Negative    Trichomonas Positive (A)    Bacterial Vaginitis (gardnerella) Negative    Candida Vaginitis Positive (A)    Candida Glabrata Negative    Comment      Normal Reference Range Bacterial Vaginosis - Negative   Comment Normal Reference Range Candida Species - Negative    Comment Normal Reference Range Candida Galbrata - Negative    Comment Normal Reference Range Trichomonas - Negative    Comment Normal Reference Ranger Chlamydia - Negative    Comment      Normal Reference Range Neisseria Gonorrhea - Negative  Results for orders placed or performed during the  hospital encounter of 03/21/24 (from the past week)  Urinalysis, Routine w reflex microscopic -Urine, Clean Catch   Collection Time: 03/21/24  5:24 PM  Result Value Ref Range   Color, Urine YELLOW YELLOW   APPearance CLOUDY (A) CLEAR   Specific Gravity, Urine 1.016 1.005 - 1.030   pH 6.0 5.0 - 8.0   Glucose, UA NEGATIVE NEGATIVE mg/dL   Hgb urine dipstick LARGE (A)  NEGATIVE   Bilirubin Urine NEGATIVE NEGATIVE   Ketones, ur NEGATIVE NEGATIVE mg/dL   Protein, ur 30 (A) NEGATIVE mg/dL   Nitrite NEGATIVE NEGATIVE   Leukocytes,Ua MODERATE (A) NEGATIVE   RBC / HPF >50 0 - 5 RBC/hpf   WBC, UA 6-10 0 - 5 WBC/hpf   Bacteria, UA FEW (A) NONE SEEN   Squamous Epithelial / HPF 0-5 0 - 5 /HPF   Mucus PRESENT   CBC   Collection Time: 03/21/24  6:48 PM  Result Value Ref Range   WBC 13.6 (H) 4.0 - 10.5 K/uL   RBC 3.36 (L) 3.87 - 5.11 MIL/uL   Hemoglobin 10.2 (L) 12.0 - 15.0 g/dL   HCT 67.3 (L) 63.9 - 53.9 %   MCV 97.0 80.0 - 100.0 fL   MCH 30.4 26.0 - 34.0 pg   MCHC 31.3 30.0 - 36.0 g/dL   RDW 85.6 88.4 - 84.4 %   Platelets 321 150 - 400 K/uL   nRBC 0.0 0.0 - 0.2 %   DG C-Arm 1-60 Min Result Date: 03/27/2024 EXAM: FLUOROSCOPIC IMAGES, 1 View TECHNIQUE: Fluoroscopy was provided by the radiology department for procedure. Radiologist was not present during examination. FLUOROSCOPY DOSE AND TYPE: Radiation Dose Index: Reference Air Kerma (in mGy) = 7.2256 mg. Fluoro time 18.7 seconds. COMPARISON: None available. CLINICAL HISTORY: surgery surgery FINDINGS: Intraoperative fluoroscopic imaging was performed. Single intra-procedural fluoroscopic image showing contrast in the left renal collecting systems and proximal ureter. IMPRESSION: 1. Intraoperative fluoroscopic spot image. NOTE: Please refer to the intraoperative report for full details. Electronically signed by: Norman Gatlin MD 03/27/2024 10:11 PM EST RP Workstation: [id number]   CT Renal Stone Study Result Date: 03/27/2024 CLINICAL DATA:   Abdominal/flank pain. Patient is pregnant. Suspect renal stone. EXAM: CT ABDOMEN AND PELVIS WITHOUT CONTRAST TECHNIQUE: Multidetector CT imaging of the abdomen and pelvis was performed following the standard protocol without IV contrast. RADIATION DOSE REDUCTION: This exam was performed according to the departmental dose-optimization program which includes automated exposure control, adjustment of the mA and/or kV according to patient size and/or use of iterative reconstruction technique. COMPARISON:  03/09/2024 FINDINGS: Lower chest: Heart is normal in size. Visualized lung bases are clear. Hepatobiliary: Liver, gallbladder and biliary tree are normal. Pancreas: Normal. Spleen: Normal. Adrenals/Urinary Tract: Adrenal glands are normal. Kidneys are normal in size. 2 mm stone over the upper pole collecting system of the left kidney unchanged. Interval development of mild left-sided hydronephrosis. The previously seen 7 mm upper pole left renal stone has now moved to the region of the distal left ureter just above the UVJ. No right-sided renal stones. Right ureter is normal. Bladder is unremarkable. Stomach/Bowel: Stomach and small bowel are normal. Appendix is normal. Colon is normal. Vascular/Lymphatic: No significant vascular findings are present. No enlarged abdominal or pelvic lymph nodes. Reproductive: Gravid uterus with fetus in cephalic presentation compatible patient's known pregnancy. Other: No free peritoneal air fluid. Musculoskeletal: No focal abnormality. IMPRESSION: 1. Interval development of mild left-sided hydronephrosis. The previously seen 7 mm upper pole left renal stone has now moved to the region of the distal left ureter just above the UVJ. 2. 2 mm nonobstructing upper pole left renal stone unchanged. 3. Gravid uterus with fetus in cephalic presentation compatible patient's known pregnancy. Electronically Signed   By: Toribio Agreste M.D.   On: 03/27/2024 15:16   US  RENAL Result Date:  03/21/2024 EXAM: US  Retroperitoneum Complete, Renal. 03/21/2024 06:52:00 PM TECHNIQUE: Real-time ultrasonography of the retroperitoneum renal was performed. COMPARISON:  CT 03/09/2024 and ultrasound 02/24/2024 CLINICAL HISTORY: Kidney stone complicating pregnancy. FINDINGS: FINDINGS: RIGHT KIDNEY/URETER: Right kidney measures 12.1 cm in length. 5 mm nonshadowing echogenic focus in the upper right kidney likely corresponds to a renal stone. No right hydronephrosis. Normal cortical echogenicity. LEFT KIDNEY/URETER: Left kidney measures 10.8 cm in length. 7 mm stone in the upper pole of the left kidney is redemonstrated similar prior CT and ultrasound. Additional 5 mm stone in the mid left kidney. Moderate left pelvocaliectasis has increased since 02/24/2024. There is a dilated left ureter measuring up to 9 mm with an echogenic focus near the mid to lower left ureter measuring 10 mm. Query obstructing stone. Normal cortical echogenicity. No mass. BLADDER: Unremarkable appearance of the bladder. Ureteral jets were not visualized. IMPRESSION: 1. Moderate left pelvocaliectasis with dilated left ureter up to 9 mm and a 10 mm echogenic focus in the mid-to-lower left ureter, suspicious for an obstructing ureteral stone; ureteral jets not visualized. 2. 7 mm stone in the upper pole of the left kidney and additional 5 mm stone in the mid left kidney. 3. 5 mm nonshadowing echogenic focus in the upper right kidney, likely a renal stone; no right hydronephrosis. Electronically signed by: Norman Gatlin MD 03/21/2024 07:17 PM EST RP Workstation: [id number]   US  MFM OB FOLLOW UP Result Date: 03/13/2024 ----------------------------------------------------------------------  OBSTETRICS REPORT                       (Signed Final 03/13/2024 12:52 pm) ---------------------------------------------------------------------- Patient Info  ID #:       [id number]                          D.O.B.:  [birth year] (23 yrs)(F)  Name:       Cabrina  Hufstedler                  Visit Date: 03/13/2024 11:22 am ---------------------------------------------------------------------- Performed By  Attending:        Steffan Keys MD         Ref. Address:     [address]                                                             [place], [place]                                                             [address]  Performed By:     Comer Harrow       [facility]  Referred By:      [facility] ---------------------------------------------------------------------- Orders  #  Description                           Code        Ordered By  1  US  MFM OB FOLLOW UP                   76816.01    RA[facility] ----------------------------------------------------------------------  #  Order #                     Accession #                Episode #  1  [id number]                   [id number]                 [id number] ---------------------------------------------------------------------- Indications  Obesity complicating pregnancy, third          O99.213  trimester (BMI 33)  Drug use complicating pregnancy, third         O99.323  trimester  Poor obstetric history: Previous preterm       O09.219  delivery, antepartum  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 Cord Insertion:      Previously seen  Amniotic Fluid  AFI FV:      Within normal limits  AFI Sum(cm)     %Tile       Largest Pocket(cm)  18.29           68          5.62  RUQ(cm)       RLQ(cm)       LUQ(cm)        LLQ(cm)  3.65          5.62  5.14           3.88 ---------------------------------------------------------------------- Biometry  BPD:      83.8  mm     G. Age:  33w 5d         82  %    CI:        78.44   %    70 - 86                                                          FL/HC:      18.9   %    19.1 - 21.3  HC:      299.3  mm     G. Age:  33w 1d         35  %    HC/AC:      1.01        0.96 - 1.17  AC:      295.7  mm     G. Age:  33w 4d         83  %    FL/BPD:     67.7   %    71 - 87  FL:       56.7  mm     G. Age:  29w 5d        1.4  %    FL/AC:      19.2   %    20 - 24  Est. FW:    1975  gm      4 lb 6 oz     44  %  Est. FW at 39 Wks:       3371  gm     7 lb 7 oz ---------------------------------------------------------------------- OB History  Gravidity:    4         Term:   1        Prem:   1        SAB:   0  TOP:          1       Ectopic:  0        Living: 2 ---------------------------------------------------------------------- Gestational Age  LMP:           38w 0d        Date:  06/21/23                  EDD:   03/27/24  U/S Today:     32w 4d                                        EDD:   05/04/24  Best:          32w 2d     Det. By:  Early Ultrasound         EDD:   05/06/24                                      (09/18/23) ---------------------------------------------------------------------- Targeted Anatomy  Central Nervous System  Calvarium/Cranial V.:  Appears normal         Cereb./Vermis:  Previously seen  Cavum:                 Previously seen        [employer]:         Previously seen  Lateral Ventricles:    Previously seen         Midline Falx:           Previously seen  Choroid Plexus:        Previously seen  Spine  Cervical:              Previously seen        Sacral:                 Appears normal  Thoracic:              Previously seen        Shape/Curvature:        Previously seen  Lumbar:                Previously seen  Head/Neck  Lips:                  Previously seen        Profile:                Previously seen  Neck:                  Previously seen        Orbits/Eyes:            Previously seen  Nuchal Fold:           Previously seen        Mandible:               Previously seen  Nasal Bone:            Previously seen        Maxilla:                Previously seen  Thorax  4 Chamber View:        Appears normal         Interventr. Septum:     Previously seen  Cardiac Rhythm:        Normal                 Cardiac Axis:           Normal  Cardiac Situs:         Previously seen        Diaphragm:              Appears normal  Rt Outflow Tract:      Previously seen        3 Vessel View:          Previously seen  Lt Outflow Tract:      Previously seen        3 V Trachea View:       Previously seen  Aortic Arch:           Previously seen        IVC:                    Previously seen  Ductal Arch:           Previously seen        Crossing:  Previously seen  SVC:                   Previously seen  Abdomen  Ventral Wall:          Previously seen        Lt Kidney:              Appears normal  Cord Insertion:        Previously seen        Rt Kidney:              Appears normal  Situs:                 Previously seen        Bladder:                Appears normal  Stomach:               Appears normal  Extremities  Lt Humerus:            Previously seen        Lt Femur:               Previously seen  Rt Humerus:            Previously seen        Rt Femur:               Previously seen  Lt Forearm:            Previously seen        Lt Lower Leg:           Previously seen  Rt Forearm:            Previously seen        Rt Lower Leg:            Previously seen  Lt Hand:               Previously seen        Lt Foot:                Previously seen  Rt Hand:               Previously seen        Rt Foot:                Previously seen  Other  Umbilical Cord:        Previously seen        Genitalia:              Female-nml  Comment:     Fetal anatomic survey complete. ---------------------------------------------------------------------- Cervix Uterus Adnexa  Cervix  Not visualized (advanced GA >24wks) ---------------------------------------------------------------------- Comments  Sonographic findings  Single intrauterine pregnancy at 32w 2d.  Fetal cardiac activity:  Observed and appears normal.  Presentation: Cephalic.  Fetal biometry shows the estimated fetal weight of 4 lb 6 oz,  1975g (44%).  Amniotic fluid volume: Within normal limits. AFI: 18.29cm.  MVP: 5.62 cm.  Placenta: Posterior.  There are limitations of prenatal ultrasound such as the  inability to detect certain abnormalities due to poor  visualization. Various factors such as fetal position,  gestational age and maternal body habitus may increase the  difficulty in visualizing the fetal anatomy.  Recommendations  - See Epic note for assessment and plan of care. Any  referring office that does not utilize Epic will recieve a copy of  today's consult note via fax. Please contact our office with  any concerns. ----------------------------------------------------------------------                   Steffan Keys, MD Electronically Signed Final Report   03/13/2024 12:52 pm ----------------------------------------------------------------------   CT ABDOMEN PELVIS WO CONTRAST Result Date: 03/09/2024 EXAM: CT ABDOMEN AND PELVIS WITHOUT CONTRAST 03/09/2024 10:09:54 PM TECHNIQUE: CT of the abdomen and pelvis was performed without the administration of intravenous contrast. Multiplanar reformatted images are provided for review. Automated exposure control, iterative reconstruction, and/or  weight-based adjustment of the mA/kV was utilized to reduce the radiation dose to as low as reasonably achievable. COMPARISON: None available. CLINICAL HISTORY: Abdominal/flank pain, stone suspected. FINDINGS: LOWER CHEST: Hypoattenuation of the cardiac blood pool in keeping with at least moderate anemia. LIVER: The liver is unremarkable. GALLBLADDER AND BILE DUCTS: Gallbladder is unremarkable. No biliary ductal dilatation. SPLEEN: No acute abnormality. PANCREAS: No acute abnormality. ADRENAL GLANDS: No acute abnormality. KIDNEYS, URETERS AND BLADDER: 2 nonobstructing renal calculi are identified within the upper pole of the left kidney measuring 8 mm and 2 mm. No ureteral calculi. No hydronephrosis. The kidneys are otherwise unremarkable. No perinephric or periureteral stranding. Urinary bladder is unremarkable. GI AND BOWEL: Appendix normal. Mass effect upon the sigmoid colon related to the gravid uterus. No evidence of obstruction. The stomach, small bowel, and large bowel are otherwise unremarkable. PERITONEUM AND RETROPERITONEUM: No ascites. No free air. VASCULATURE: Aorta is normal in caliber. LYMPH NODES: No lymphadenopathy. REPRODUCTIVE ORGANS: Gravid uterus. Single fetus in cephalic presentation. The ovaries are not well delineated on this noncontrast examination from the uterine corpus. BONES AND SOFT TISSUES: No acute osseous abnormality. No focal soft tissue abnormality. IMPRESSION: 1. 2 nonobstructing left renal calculi in the upper pole measuring 8 mm and 2 mm; no ureteral calculi or hydronephrosis 2. Gravid uterus with single fetus in cephalic presentation; mass effect on the sigmoid colon without obstruction 3. Hypoattenuation of the cardiac blood pool compatible with at least moderate anemia; correlate clinically Electronically signed by: Dorethia Molt MD 03/09/2024 10:18 PM EST RP Workstation: [id number]    Future Appointments  Date Time Provider Department Center  04/01/2024  2:30 PM Rudy Carlin LABOR, MD [facility] None  04/09/2024 10:15 AM Eveline Lynwood MATSU, MD [facility] None  04/15/2024  6:45 AM [facility] SCHED ROOM [facility] None    Discharge Condition: Stable  Discharge disposition: 01-Home or Self Care       Discharge Instructions     Discharge activity:  No Restrictions   Complete by: As directed    Discharge diet:  No restrictions   Complete by: As directed    Fetal Kick Count:  Lie on our left side for one hour after a meal, and count the number of times your baby kicks.  If it is less than 5 times, get up, move around and drink some juice.  Repeat the test 30 minutes later.  If it is still less than 5 kicks in an hour, notify your doctor.   Complete by: As directed       Allergies as of 03/28/2024       Reactions   Abilify [aripiprazole] Other (See Comments)   Seizures   Metronidazole  Hives, Other (See Comments)   Inguinal area        Medication List     TAKE these medications    acetaminophen  325 MG tablet Commonly known as: TYLENOL  Take 650 mg by mouth every 4 (four) hours as needed  for mild pain (pain score 1-3) or moderate pain (pain score 4-6).   acetaminophen -caffeine  500-65 MG Tabs per tablet Commonly known as: EXCEDRIN TENSION HEADACHE Take 2 tablets by mouth 4 (four) times daily as needed (For headaches).   Boric Acid 600 MG Supp Place 600 mg vaginally 2 (two) times daily.   cyclobenzaprine  10 MG tablet Commonly known as: FLEXERIL  Take 0.5-1 tablets (5-10 mg total) by mouth 3 (three) times daily as needed for muscle spasms.   famotidine  40 MG tablet Commonly known as: PEPCID  Take 1 tablet (40 mg total) by mouth daily.   ferrous sulfate  325 (65 FE) MG tablet Take 1 tablet (325 mg total) by mouth daily.   HYDROmorphone  2 MG tablet Commonly known as: DILAUDID  Take 1 tablet (2 mg total) by mouth every 4 (four) hours as needed for severe pain (pain score 7-10).   ondansetron  8 MG disintegrating tablet Commonly known as: ZOFRAN -ODT Take  1 tablet (8 mg total) by mouth every 8 (eight) hours as needed for nausea or vomiting.   promethazine  25 MG tablet Commonly known as: PHENERGAN  Take 1 tablet (25 mg total) by mouth every 6 (six) hours as needed for nausea or vomiting.   tamsulosin  0.4 MG Caps capsule Commonly known as: FLOMAX  Take 1 capsule (0.4 mg total) by mouth daily.   terconazole  0.4 % vaginal cream Commonly known as: TERAZOL 7  Place 1 applicator vaginally at bedtime. Use for seven days   valACYclovir  500 MG tablet Commonly known as: Valtrex  Take 1 tablet (500 mg total) by mouth 2 (two) times daily.   Vitafol  Gummies 3.33-0.333-34.8 MG Chew Chew 3 tablets by mouth daily.        Follow-up Information     [facility] Follow up.   Why: keep next scheduled appointment Contact information: [address] [place]  [address] [phone]                Total discharge time: 36 minutes   Signed: Glenys GORMAN Birk M.D. 03/28/2024, 10:39 AM

## 2024-03-28 NOTE — Progress Notes (Signed)
 1 Day Post-Op Subjective: First time meeting Alyssa Howell.  No acute events overnight.  Some expected postoperative burning with urination.  Pain well-controlled.  Eager to have breakfast and return home  Objective: Vital signs in last 24 hours: Temp:  [97.7 F (36.5 C)-98.9 F (37.2 C)] 98.4 F (36.9 C) (12/05 0735) Pulse Rate:  [91-126] 110 (12/05 0748) Resp:  [12-18] 18 (12/05 0735) BP: (92-128)/(39-78) 101/46 (12/05 0748) SpO2:  [91 %-100 %] 95 % (12/05 0735)  Assessment/Plan: 35w OB patient with 7 mm left ureteral stone.  Stented with Dr. Selma on 03/28/2024.  # Left ureteral stone # Contaminated urinalysis  I am not convinced that she is infected.  Her leukocytosis explained by inflammation, dehydration, and pain.  Urinalysis notes many bacteria but is also significantly contaminated.  Afebrile with no signs of systemic infection.  It is reasonable to provide her a prophylactic course of ABX, but she does not require further hospitalization from a urologic perspective  Definitive stone management on an outpatient basis.  Patient is experience with stones and stents.  We briefly reviewed the case and plan and all questions were answered to her satisfaction.  We will plan to schedule her treatment sometime in January after her delivery.  Intake/Output from previous day: 12/04 0701 - 12/05 0700 In: 2641.3 [I.V.:2591.3; IV Piggyback:50] Out: 650 [Urine:650]  Intake/Output this shift: Total I/O In: -  Out: 600 [Urine:600]  Physical Exam:  General: Alert and oriented CV: No cyanosis Lungs: equal chest rise   Lab Results: Recent Labs    03/27/24 1053 03/27/24 1724  HGB 10.3* 9.7*  HCT 32.8* 30.7*   BMET Recent Labs    03/27/24 1053 03/27/24 1724  NA 136  --   K 3.8  --   CL 107  --   CO2 22  --   GLUCOSE 90  --   BUN 10  --   CREATININE 0.83 0.73  CALCIUM  8.6*  --   HGB 10.3* 9.7*  WBC 13.1* 13.8*     Studies/Results: DG C-Arm 1-60 Min Result Date:  03/27/2024 EXAM: FLUOROSCOPIC IMAGES, 1 View TECHNIQUE: Fluoroscopy was provided by the radiology department for procedure. Radiologist was not present during examination. FLUOROSCOPY DOSE AND TYPE: Radiation Dose Index: Reference Air Kerma (in mGy) = 7.2256 mg. Fluoro time 18.7 seconds. COMPARISON: None available. CLINICAL HISTORY: surgery surgery FINDINGS: Intraoperative fluoroscopic imaging was performed. Single intra-procedural fluoroscopic image showing contrast in the left renal collecting systems and proximal ureter. IMPRESSION: 1. Intraoperative fluoroscopic spot image. NOTE: Please refer to the intraoperative report for full details. Electronically signed by: Norman Gatlin MD 03/27/2024 10:11 PM EST RP Workstation: [id number]   CT Renal Stone Study Result Date: 03/27/2024 CLINICAL DATA:  Abdominal/flank pain. Patient is pregnant. Suspect renal stone. EXAM: CT ABDOMEN AND PELVIS WITHOUT CONTRAST TECHNIQUE: Multidetector CT imaging of the abdomen and pelvis was performed following the standard protocol without IV contrast. RADIATION DOSE REDUCTION: This exam was performed according to the departmental dose-optimization program which includes automated exposure control, adjustment of the mA and/or kV according to patient size and/or use of iterative reconstruction technique. COMPARISON:  03/09/2024 FINDINGS: Lower chest: Heart is normal in size. Visualized lung bases are clear. Hepatobiliary: Liver, gallbladder and biliary tree are normal. Pancreas: Normal. Spleen: Normal. Adrenals/Urinary Tract: Adrenal glands are normal. Kidneys are normal in size. 2 mm stone over the upper pole collecting system of the left kidney unchanged. Interval development of mild left-sided hydronephrosis. The previously seen 7 mm upper  pole left renal stone has now moved to the region of the distal left ureter just above the UVJ. No right-sided renal stones. Right ureter is normal. Bladder is unremarkable. Stomach/Bowel: Stomach  and small bowel are normal. Appendix is normal. Colon is normal. Vascular/Lymphatic: No significant vascular findings are present. No enlarged abdominal or pelvic lymph nodes. Reproductive: Gravid uterus with fetus in cephalic presentation compatible patient's known pregnancy. Other: No free peritoneal air fluid. Musculoskeletal: No focal abnormality. IMPRESSION: 1. Interval development of mild left-sided hydronephrosis. The previously seen 7 mm upper pole left renal stone has now moved to the region of the distal left ureter just above the UVJ. 2. 2 mm nonobstructing upper pole left renal stone unchanged. 3. Gravid uterus with fetus in cephalic presentation compatible patient's known pregnancy. Electronically Signed   By: Toribio Agreste M.D.   On: 03/27/2024 15:16      LOS: 0 days   Ole Bourdon, NP [facility] Pager: [phone]  03/28/2024, 9:17 AM

## 2024-03-29 LAB — CULTURE, BETA STREP (GROUP B ONLY): Strep Gp B Culture: POSITIVE — AB

## 2024-03-30 ENCOUNTER — Inpatient Hospital Stay (HOSPITAL_COMMUNITY)
Admission: AD | Admit: 2024-03-30 | Discharge: 2024-04-04 | DRG: 806 | Disposition: A | Attending: Obstetrics & Gynecology | Admitting: Obstetrics & Gynecology

## 2024-03-30 ENCOUNTER — Other Ambulatory Visit: Payer: Self-pay

## 2024-03-30 ENCOUNTER — Inpatient Hospital Stay (HOSPITAL_COMMUNITY)

## 2024-03-30 ENCOUNTER — Encounter (HOSPITAL_COMMUNITY): Payer: Self-pay | Admitting: Obstetrics and Gynecology

## 2024-03-30 DIAGNOSIS — N202 Calculus of kidney with calculus of ureter: Secondary | ICD-10-CM | POA: Diagnosis not present

## 2024-03-30 DIAGNOSIS — N200 Calculus of kidney: Secondary | ICD-10-CM | POA: Diagnosis not present

## 2024-03-30 DIAGNOSIS — Z3A35 35 weeks gestation of pregnancy: Secondary | ICD-10-CM

## 2024-03-30 DIAGNOSIS — F1123 Opioid dependence with withdrawal: Secondary | ICD-10-CM | POA: Diagnosis not present

## 2024-03-30 DIAGNOSIS — O99214 Obesity complicating childbirth: Secondary | ICD-10-CM | POA: Diagnosis not present

## 2024-03-30 DIAGNOSIS — O26893 Other specified pregnancy related conditions, third trimester: Secondary | ICD-10-CM | POA: Diagnosis not present

## 2024-03-30 DIAGNOSIS — O9962 Diseases of the digestive system complicating childbirth: Secondary | ICD-10-CM | POA: Diagnosis not present

## 2024-03-30 DIAGNOSIS — Z833 Family history of diabetes mellitus: Secondary | ICD-10-CM | POA: Diagnosis not present

## 2024-03-30 DIAGNOSIS — N23 Unspecified renal colic: Secondary | ICD-10-CM | POA: Diagnosis not present

## 2024-03-30 DIAGNOSIS — O99891 Other specified diseases and conditions complicating pregnancy: Secondary | ICD-10-CM | POA: Diagnosis not present

## 2024-03-30 DIAGNOSIS — K219 Gastro-esophageal reflux disease without esophagitis: Secondary | ICD-10-CM | POA: Diagnosis not present

## 2024-03-30 DIAGNOSIS — R102 Pelvic and perineal pain unspecified side: Secondary | ICD-10-CM | POA: Diagnosis not present

## 2024-03-30 DIAGNOSIS — Z7401 Bed confinement status: Secondary | ICD-10-CM | POA: Diagnosis not present

## 2024-03-30 DIAGNOSIS — O99324 Drug use complicating childbirth: Secondary | ICD-10-CM | POA: Diagnosis not present

## 2024-03-30 DIAGNOSIS — Z883 Allergy status to other anti-infective agents status: Secondary | ICD-10-CM | POA: Diagnosis not present

## 2024-03-30 DIAGNOSIS — O99820 Streptococcus B carrier state complicating pregnancy: Secondary | ICD-10-CM | POA: Diagnosis not present

## 2024-03-30 DIAGNOSIS — Z8249 Family history of ischemic heart disease and other diseases of the circulatory system: Secondary | ICD-10-CM | POA: Diagnosis not present

## 2024-03-30 DIAGNOSIS — O26833 Pregnancy related renal disease, third trimester: Secondary | ICD-10-CM | POA: Diagnosis not present

## 2024-03-30 DIAGNOSIS — R109 Unspecified abdominal pain: Secondary | ICD-10-CM | POA: Diagnosis not present

## 2024-03-30 DIAGNOSIS — Z3A34 34 weeks gestation of pregnancy: Secondary | ICD-10-CM | POA: Diagnosis not present

## 2024-03-30 DIAGNOSIS — R10A2 Flank pain, left side: Secondary | ICD-10-CM | POA: Diagnosis not present

## 2024-03-30 DIAGNOSIS — Z79899 Other long term (current) drug therapy: Secondary | ICD-10-CM | POA: Diagnosis not present

## 2024-03-30 DIAGNOSIS — A5901 Trichomonal vulvovaginitis: Secondary | ICD-10-CM | POA: Diagnosis not present

## 2024-03-30 DIAGNOSIS — A6000 Herpesviral infection of urogenital system, unspecified: Secondary | ICD-10-CM | POA: Diagnosis not present

## 2024-03-30 DIAGNOSIS — O9902 Anemia complicating childbirth: Secondary | ICD-10-CM | POA: Diagnosis not present

## 2024-03-30 DIAGNOSIS — O9832 Other infections with a predominantly sexual mode of transmission complicating childbirth: Secondary | ICD-10-CM | POA: Diagnosis not present

## 2024-03-30 DIAGNOSIS — N132 Hydronephrosis with renal and ureteral calculous obstruction: Secondary | ICD-10-CM | POA: Diagnosis not present

## 2024-03-30 LAB — URINALYSIS, ROUTINE W REFLEX MICROSCOPIC
Bilirubin Urine: NEGATIVE
Glucose, UA: NEGATIVE mg/dL
Ketones, ur: NEGATIVE mg/dL
Nitrite: NEGATIVE
Protein, ur: 100 mg/dL — AB
RBC / HPF: >50 RBC/hpf (ref 0–5)
Specific Gravity, Urine: 1.019 (ref 1.005–1.030)
pH: 7 (ref 5.0–8.0)

## 2024-03-30 LAB — CBC WITH DIFFERENTIAL/PLATELET
Abs Immature Granulocytes: 0.18 K/uL — ABNORMAL HIGH (ref 0.00–0.07)
Basophils Absolute: 0.1 K/uL (ref 0.0–0.1)
Basophils Relative: 1 %
Eosinophils Absolute: 0.2 K/uL (ref 0.0–0.5)
Eosinophils Relative: 2 %
HCT: 33.8 % — ABNORMAL LOW (ref 36.0–46.0)
Hemoglobin: 10.7 g/dL — ABNORMAL LOW (ref 12.0–15.0)
Immature Granulocytes: 2 %
Lymphocytes Relative: 17 %
Lymphs Abs: 2 K/uL (ref 0.7–4.0)
MCH: 31 pg (ref 26.0–34.0)
MCHC: 31.7 g/dL (ref 30.0–36.0)
MCV: 98 fL (ref 80.0–100.0)
Monocytes Absolute: 0.9 K/uL (ref 0.1–1.0)
Monocytes Relative: 8 %
Neutro Abs: 8 K/uL — ABNORMAL HIGH (ref 1.7–7.7)
Neutrophils Relative %: 70 %
Platelets: 287 K/uL (ref 150–400)
RBC: 3.45 MIL/uL — ABNORMAL LOW (ref 3.87–5.11)
RDW: 16.6 % — ABNORMAL HIGH (ref 11.5–15.5)
WBC: 11.3 K/uL — ABNORMAL HIGH (ref 4.0–10.5)
nRBC: 0 % (ref 0.0–0.2)

## 2024-03-30 LAB — WET PREP, GENITAL
Clue Cells Wet Prep HPF POC: NONE SEEN
Sperm: NONE SEEN
Trich, Wet Prep: NONE SEEN
WBC, Wet Prep HPF POC: >=10 — AB (ref <10)
Yeast Wet Prep HPF POC: NONE SEEN

## 2024-03-30 LAB — TYPE AND SCREEN
ABO/RH(D): A POS
Antibody Screen: NEGATIVE

## 2024-03-30 MED ORDER — HYDROMORPHONE HCL 1 MG/ML IJ SOLN
1.0000 mg | Freq: Once | INTRAMUSCULAR | Status: AC
  Administered 2024-03-30: 1 mg via INTRAVENOUS
  Filled 2024-03-30: qty 1

## 2024-03-30 MED ORDER — SODIUM CHLORIDE 0.9 % IV SOLN: 250.0000 mL | INTRAVENOUS | Status: AC | PRN

## 2024-03-30 MED ORDER — PIPERACILLIN-TAZOBACTAM 3.375 G IVPB 30 MIN: 3.3750 g | Freq: Three times a day (TID) | INTRAVENOUS | Status: DC

## 2024-03-30 MED ORDER — OXYCODONE HCL 5 MG PO TABS
10.0000 mg | ORAL_TABLET | ORAL | Status: DC | PRN
  Administered 2024-03-31 (×3): 10 mg via ORAL
  Filled 2024-03-30 (×3): qty 2

## 2024-03-30 MED ORDER — PIPERACILLIN-TAZOBACTAM 3.375 G IVPB
3.3750 g | Freq: Three times a day (TID) | INTRAVENOUS | Status: DC
  Administered 2024-03-30 – 2024-04-01 (×5): 3.375 g via INTRAVENOUS
  Filled 2024-03-30 (×6): qty 50

## 2024-03-30 MED ORDER — ACETAMINOPHEN 10 MG/ML IV SOLN
1000.0000 mg | Freq: Once | INTRAVENOUS | Status: AC
  Administered 2024-03-30: 1000 mg via INTRAVENOUS
  Filled 2024-03-30: qty 100

## 2024-03-30 MED ORDER — SODIUM CHLORIDE 0.9% FLUSH
3.0000 mL | Freq: Two times a day (BID) | INTRAVENOUS | Status: DC
  Administered 2024-03-30 – 2024-03-31 (×2): 3 mL via INTRAVENOUS

## 2024-03-30 MED ORDER — CALCIUM CARBONATE ANTACID 500 MG PO CHEW
2.0000 | CHEWABLE_TABLET | ORAL | Status: DC | PRN
  Administered 2024-03-30: 400 mg via ORAL
  Filled 2024-03-30: qty 2

## 2024-03-30 MED ORDER — SODIUM CHLORIDE 0.9% FLUSH: 3.0000 mL | INTRAVENOUS | Status: DC | PRN

## 2024-03-30 MED ORDER — LACTATED RINGERS IV BOLUS
1000.0000 mL | Freq: Once | INTRAVENOUS | Status: AC
  Administered 2024-03-30: 1000 mL via INTRAVENOUS

## 2024-03-30 MED ORDER — PRENATAL MULTIVITAMIN CH
1.0000 | ORAL_TABLET | Freq: Every day | ORAL | Status: DC
  Administered 2024-03-31 – 2024-04-01 (×2): 1 via ORAL
  Filled 2024-03-30 (×2): qty 1

## 2024-03-30 MED ORDER — TAMSULOSIN HCL 0.4 MG PO CAPS: 0.4000 mg | ORAL_CAPSULE | Freq: Every day | ORAL | Status: DC

## 2024-03-30 MED ORDER — ACETAMINOPHEN 325 MG PO TABS
650.0000 mg | ORAL_TABLET | ORAL | Status: DC | PRN
  Administered 2024-03-31 – 2024-04-01 (×5): 650 mg via ORAL
  Filled 2024-03-30 (×5): qty 2

## 2024-03-30 MED ORDER — VALACYCLOVIR HCL 500 MG PO TABS
500.0000 mg | ORAL_TABLET | Freq: Two times a day (BID) | ORAL | Status: DC
  Administered 2024-03-30 – 2024-04-03 (×9): 500 mg via ORAL
  Filled 2024-03-30 (×10): qty 1

## 2024-03-30 MED ORDER — LACTATED RINGERS IV SOLN: 125.0000 mL/h | INTRAVENOUS | Status: AC

## 2024-03-30 MED ORDER — DOCUSATE SODIUM 100 MG PO CAPS
100.0000 mg | ORAL_CAPSULE | Freq: Every day | ORAL | Status: DC
  Administered 2024-03-30 – 2024-04-01 (×3): 100 mg via ORAL
  Filled 2024-03-30 (×3): qty 1

## 2024-03-30 MED ORDER — TAMSULOSIN HCL 0.4 MG PO CAPS
0.4000 mg | ORAL_CAPSULE | Freq: Every day | ORAL | Status: DC
  Administered 2024-03-31 – 2024-04-03 (×4): 0.4 mg via ORAL
  Filled 2024-03-30 (×5): qty 1

## 2024-03-30 NOTE — H&P (Signed)
 Obstetric History and Physical  Tajah Vultaggio is a 23 y.o. [id number] with IUP at [redacted]w[redacted]d presenting for complaints of vaginal pain. Patient reports having a sten placed on Thursday. Reports today a sudden 10/10 ripping sensation in her vagina that migrates up on her left side. Pain is worsened by movement and sitting. Only comfort she receives in propping herself up on her right side. She states that she took dilaudid  at 1500 without relief. Reports blood and passing 3-4 quarter sized clots today with urination. Reports that she has been profusely sweating since stopping Percocet, but is unsure of fevers. Also complains of abdominal pressure and tightening. Denies abnormal vaginal discharge, vaginal bleeding and leaking of fluid. Endorses positive fetal movement.    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   Obesity affecting pregnancy, antepartum 12/31/2023   History of preterm delivery, currently pregnant 12/14/2023   Family history of congenital heart defect-prev child double aortic valve 12/14/2023   Supervision of high risk pregnancy, antepartum 09/18/2023   Recurrent nephrolithiasis 11/26/2020   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 02/24/2019   Depression, recurrent 02/24/2019   Prenatal labs and studies: ABO, Rh: --/--/A POS (12/04 1721) Antibody: NEG (12/04 1721) Rubella: 30.50 (08/22 1346) RPR: Non Reactive (10/21 0816)  HBsAg: Negative (08/22 1346)  HIV: Non Reactive (10/21 0816)  HAD:Endpupcz/-- (12/03 1500) 2 hr GTT:  normal Genetic screening   Anatomy US  normal  Medical History:  Past Medical History:  Diagnosis Date   ADHD    Anemia     Chlamydia 2022   Depression    GERD (gastroesophageal reflux disease) 10/09/2019   HSV-2 infection    per pt/ seen in ED [facility]   Kidney stones    Seizures (HCC)    x 1 in 5th grade after taking ability. No other episodes.   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   Vaginal pain at introitus  Referred to Pelvic Floor PT      Past Surgical History:  Procedure Laterality Date   CYSTOSCOPY W/ URETERAL STENT PLACEMENT Left 03/27/2024   Procedure: CYSTOSCOPY, WITH RETROGRADE PYELOGRAM AND URETERAL STENT INSERTION;  Surgeon: Selma Donnice SAUNDERS, MD;  [facility];  Service: Urology;  Laterality: Left;   CYSTOSCOPY/URETEROSCOPY/HOLMIUM LASER/STENT PLACEMENT Right 08/02/2020   Procedure: CYSTOSCOPY/URETEROSCOPY/HOLMIUM LASER/STENT PLACEMENT;  Surgeon: Penne Knee, MD;  Location: [facility] ORS;  Service: Urology;  Laterality: Right;   DILATION AND CURETTAGE OF UTERUS N/A 08/12/2022   Procedure: DILATATION AND CURETTAGE, ULTRASOUND GUIDED;  Surgeon: Zina Jerilynn LABOR, MD;  Location: [facility] OR;  Service: Gynecology;  Laterality: N/A;   FOOT SURGERY Bilateral    metal screws in toes,2020   TONSILLECTOMY      OB History  Gravida Para Term Preterm AB Living  5 2 1 1 2 2   SAB IAB Ectopic Multiple Live Births  1 1 0 0 2    # Outcome Date GA Lbr Len/2nd Weight Sex Type Anes PTL Lv  5 Current           4 IAB 01/2023          3 Preterm 08/05/22 [redacted]w[redacted]d 26:50 / 00:32 2590 g M Vag-Spont EPI  LIV  2 Term 03/2021 [redacted]w[redacted]d  3585 g M Vag-Spont   LIV  1 SAB             Social History   Socioeconomic History   Marital status: Single    Spouse name: Not on file   Number of children: Not on file   Years of education: Not on file   Highest education level: Not on file  Occupational History   Not on file  Tobacco Use   Smoking status: Never   Smokeless tobacco: Never  Vaping Use   Vaping status: Never Used  Substance and Sexual Activity   Alcohol use: Not Currently   Drug  use: Not Currently    Types: Marijuana    Comment: last used the end of September 2022 as of 02/07/2021   Sexual activity: Yes    Partners: Male    Birth control/protection: None  Other Topics Concern   Not on file  Social History Narrative   From age [age] the patient lived with adoptive parents, grandparents and foster families.  At age 58 her mom gain custody.  Her name is pronounced [name] .  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  She is not sure what she wants to do with her life.  Sexually active.  Does not use condoms 7 partners in 2020.   Social Drivers of Corporate Investment Banker Strain: Not on file  Food Insecurity: No Food Insecurity (03/27/2024)   Hunger Vital Sign    Worried About Running Out of Food in the Last Year: Never true    Ran Out of Food in the Last Year: Never true  Transportation Needs: No Transportation Needs (03/27/2024)   PRAPARE - Administrator, Civil Service (Medical): No    Lack of Transportation (Non-Medical): No  Physical Activity: Not on file  Stress: Not on file  Social Connections: Not on file    Family History  Problem Relation Age of Onset   Hepatitis C Mother    Valvular heart disease Mother    Other Father        no contact in over 28yrs   Cancer Maternal Grandmother    Breast cancer Maternal Grandmother    Diabetes Other    Heart attack Other    Asthma Neg Hx    Heart disease Neg Hx    Hypertension Neg Hx     Medications Prior to Admission  Medication Sig Dispense Refill Last Dose/Taking   HYDROmorphone  (DILAUDID ) 2 MG tablet Take 1 tablet (2 mg total) by mouth every 4 (four) hours as needed for severe pain (pain score 7-10). 30 tablet 0 03/30/2024 at  3:00 PM   ondansetron  (ZOFRAN -ODT) 8 MG disintegrating tablet Take 1 tablet (8 mg total) by mouth every 8 (eight) hours as needed for nausea or vomiting. 20 tablet 0 03/30/2024   Prenatal Vit-Fe Phos-FA-Omega (VITAFOL  GUMMIES) 3.33-0.333-34.8 MG  CHEW Chew 3 tablets by mouth daily. 90 tablet 11 03/30/2024   valACYclovir  (VALTREX ) 500 MG tablet Take 1 tablet (500 mg total) by mouth 2 (two) times daily. 60 tablet 4 03/30/2024   acetaminophen  (TYLENOL ) 325 MG tablet Take 650 mg by mouth every 4 (four) hours as needed for mild pain (pain score 1-3) or moderate pain (pain score 4-6).      acetaminophen -caffeine  (EXCEDRIN TENSION HEADACHE) 500-65 MG TABS per tablet Take 2 tablets by mouth 4 (four) times daily as needed (For headaches). 60 tablet 0  Boric Acid 600 MG SUPP Place 600 mg vaginally 2 (two) times daily. 60 suppository 0    cyclobenzaprine  (FLEXERIL ) 10 MG tablet Take 0.5-1 tablets (5-10 mg total) by mouth 3 (three) times daily as needed for muscle spasms. 15 tablet 0    famotidine  (PEPCID ) 40 MG tablet Take 1 tablet (40 mg total) by mouth daily. 90 tablet 3    ferrous sulfate  325 (65 FE) MG tablet Take 1 tablet (325 mg total) by mouth daily. 30 tablet 0    promethazine  (PHENERGAN ) 25 MG tablet Take 1 tablet (25 mg total) by mouth every 6 (six) hours as needed for nausea or vomiting. 30 tablet 0    tamsulosin  (FLOMAX ) 0.4 MG CAPS capsule Take 1 capsule (0.4 mg total) by mouth daily. 30 capsule 2    terconazole  (TERAZOL 7 ) 0.4 % vaginal cream Place 1 applicator vaginally at bedtime. Use for seven days 45 g 0     Allergies  Allergen Reactions   Abilify [Aripiprazole] Other (See Comments)    Seizures   Metronidazole  Hives and Other (See Comments)    Inguinal area    Review of Systems: Negative except for what is mentioned in HPI.  Physical Exam: BP 113/61 (BP Location: Right Arm)   Pulse (!) 124   Temp 98 F (36.7 C) (Oral)   Resp 17   Ht 5' 5 (1.651 m)   Wt 102.1 kg   LMP 06/21/2023 (Approximate)   SpO2 94%   BMI 37.46 kg/m  CONSTITUTIONAL: Well-developed, well-nourished female in mild distress.  HENT:  Normocephalic, atraumatic, External right and left ear normal. Oropharynx is clear and moist EYES: Conjunctivae and  EOM are normal. Pupils are equal, round, and reactive to light. No scleral icterus.  NECK: Normal range of motion, supple, no masses SKIN: Skin is warm and dry. No rash noted. Not diaphoretic. No erythema. No pallor. NEUROLOGIC: Alert and oriented to person, place, and time. Normal reflexes, muscle tone coordination. No cranial nerve deficit noted. PSYCHIATRIC: Normal mood and affect. Normal behavior. Normal judgment and thought content. CARDIOVASCULAR: Normal heart rate noted RESPIRATORY: Effort normal, no problems with respiration noted ABDOMEN: Soft, nontender, nondistended, gravid. MUSCULOSKELETAL: Normal range of motion. No edema and no tenderness.    Cervical Exam: Dilation: 2 Effacement (%): Thick   Presentation: cephalic FHT:  reactive NST   Pertinent Labs/Studies:   Results for orders placed or performed during the hospital encounter of 03/30/24 (from the past 24 hours)  Wet prep, genital     Status: Abnormal   Collection Time: 03/30/24  6:46 PM   Specimen: PATH Cytology Cervicovaginal Ancillary Only  Result Value Ref Range   Yeast Wet Prep HPF POC NONE SEEN NONE SEEN   Trich, Wet Prep NONE SEEN NONE SEEN   Clue Cells Wet Prep HPF POC NONE SEEN NONE SEEN   WBC, Wet Prep HPF POC >=10 (A) <10   Sperm NONE SEEN   Urinalysis, Routine w reflex microscopic -Urine, Clean Catch     Status: Abnormal   Collection Time: 03/30/24  6:50 PM  Result Value Ref Range   Color, Urine YELLOW YELLOW   APPearance CLOUDY (A) CLEAR   Specific Gravity, Urine 1.019 1.005 - 1.030   pH 7.0 5.0 - 8.0   Glucose, UA NEGATIVE NEGATIVE mg/dL   Hgb urine dipstick LARGE (A) NEGATIVE   Bilirubin Urine NEGATIVE NEGATIVE   Ketones, ur NEGATIVE NEGATIVE mg/dL   Protein, ur 899 (A) NEGATIVE mg/dL   Nitrite NEGATIVE NEGATIVE  Leukocytes,Ua SMALL (A) NEGATIVE   RBC / HPF >50 0 - 5 RBC/hpf   WBC, UA 0-5 0 - 5 WBC/hpf   Bacteria, UA RARE (A) NONE SEEN   Squamous Epithelial / HPF 0-5 0 - 5 /HPF   Mucus  PRESENT     Assessment : Adisa Colston is a 23 y.o. [id number] at [redacted]w[redacted]d being admitted for pain management and broad spectrum antibiotics given new onset pain after ureteral stent placement 03/27/24 for left ureteral stone with obstruction.   Plan - Admit to [facility]   Ureteral stent/stone - consult nephrology in am - UA/urine culture - zosyn  per nephrology -renal US  - Analgesia as needed  FWB  - daily NST  HSV - cont valtrex     K. Yolanda Moats, MD, [facility] Attending Center for [facility] (Faculty Practice)  03/30/2024, 7:44 PM

## 2024-03-30 NOTE — MAU Note (Signed)
 Alyssa Howell is a 23 y.o. at [redacted]w[redacted]d here in MAU reporting: had a kidney stent placed Thursday evening. 1-2 hours ago there was a sudden onset of pain at the stent site and down her left side. States when she sits or stands she feels like it is ripping more. States she has been passing blood clots the size of golf balls that are painful when it comes out. Denies any N/V/D currently. Has been waking up sweating since Friday.    Onset of complaint: 1500 Pain score: 10 Vitals:   03/30/24 1751  BP: 113/61  Pulse: (!) 124  Resp: 17  Temp: 98 F (36.7 C)  SpO2: 94%     FHT:155 Lab orders placed from triage:

## 2024-03-30 NOTE — Plan of Care (Signed)
  Problem: Education: Goal: Knowledge of disease or condition will improve Outcome: Progressing Goal: Knowledge of the prescribed therapeutic regimen will improve Outcome: Progressing   Problem: Clinical Measurements: Goal: Complications related to the disease process, condition or treatment will be avoided or minimized Outcome: Progressing   Problem: Education: Goal: Knowledge of General Education information will improve Description: Including pain rating scale, medication(s)/side effects and non-pharmacologic comfort measures Outcome: Progressing

## 2024-03-30 NOTE — MAU Provider Note (Signed)
 History     CSN: [id number]  Arrival date and time: 03/30/24 1736   Event Date/Time   First Provider Initiated Contact with Patient 03/30/24 1821      Chief Complaint  Patient presents with   Flank Pain   Alyssa Howell , a  23 y.o. [id number] at [redacted]w[redacted]d presents to MAU with complaints of vaginal pain. Patient reports having a sten placed on Thursday. Reports today a sudden 10/10 ripping sensation in her vagina that migrates up on her left side. Pain is worsened by movement and sitting. Only comfort she receives in propping herself up on her right side. She states that she took dilaudid  at 1500 without relief. Reports blood and passing 3-4 quarter sized clots today with urination. Reports that she has been profusely sweating since stopping Percocet, but is unsure of fevers. Also complains of abdominal pressure and tightening. Denies abnormal vaginal discharge, vaginal bleeding and leaking of fluid. Endorses positive fetal movement.          OB History     Gravida  5   Para  2   Term  1   Preterm  1   AB  2   Living  2      SAB  1   IAB  1   Ectopic  0   Multiple  0   Live Births  2           Past Medical History:  Diagnosis Date   ADHD    Anemia    Chlamydia 2022   Depression    GERD (gastroesophageal reflux disease) 10/09/2019   HSV-2 infection    per pt/ seen in ED [facility]   Kidney stones    Seizures (HCC)    x 1 in 5th grade after taking ability. No other episodes.   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   Vaginal pain at introitus  Referred to Pelvic Floor PT      Past Surgical History:  Procedure Laterality Date   CYSTOSCOPY W/ URETERAL STENT PLACEMENT Left 03/27/2024   Procedure: CYSTOSCOPY, WITH RETROGRADE PYELOGRAM AND URETERAL STENT INSERTION;  Surgeon: Selma Donnice SAUNDERS, MD;  Location: [facility] OR;  Service: Urology;  Laterality: Left;   CYSTOSCOPY/URETEROSCOPY/HOLMIUM LASER/STENT PLACEMENT Right 08/02/2020    Procedure: CYSTOSCOPY/URETEROSCOPY/HOLMIUM LASER/STENT PLACEMENT;  Surgeon: Penne Knee, MD;  Location: [facility] ORS;  Service: Urology;  Laterality: Right;   DILATION AND CURETTAGE OF UTERUS N/A 08/12/2022   Procedure: DILATATION AND CURETTAGE, ULTRASOUND GUIDED;  Surgeon: Zina Jerilynn LABOR, MD;  Location: [facility] OR;  Service: Gynecology;  Laterality: N/A;   FOOT SURGERY Bilateral    metal screws in toes,2020   TONSILLECTOMY      Family History  Problem Relation Age of Onset   Hepatitis C Mother    Valvular heart disease Mother    Other Father        no contact in over 12yrs   Cancer Maternal Grandmother    Breast cancer Maternal Grandmother    Diabetes Other    Heart attack Other    Asthma Neg Hx    Heart disease Neg Hx    Hypertension Neg Hx     Social History   Tobacco Use   Smoking status: Never   Smokeless tobacco: Never  Vaping Use   Vaping status: Never Used  Substance Use Topics   Alcohol use: Not Currently   Drug use: Not Currently    Types: Marijuana    Comment: last used  the end of September 2022 as of 02/07/2021    Allergies:  Allergies  Allergen Reactions   Abilify [Aripiprazole] Other (See Comments)    Seizures   Metronidazole  Hives and Other (See Comments)    Inguinal area    Medications Prior to Admission  Medication Sig Dispense Refill Last Dose/Taking   HYDROmorphone  (DILAUDID ) 2 MG tablet Take 1 tablet (2 mg total) by mouth every 4 (four) hours as needed for severe pain (pain score 7-10). 30 tablet 0 03/30/2024 at  3:00 PM   ondansetron  (ZOFRAN -ODT) 8 MG disintegrating tablet Take 1 tablet (8 mg total) by mouth every 8 (eight) hours as needed for nausea or vomiting. 20 tablet 0 03/30/2024   Prenatal Vit-Fe Phos-FA-Omega (VITAFOL  GUMMIES) 3.33-0.333-34.8 MG CHEW Chew 3 tablets by mouth daily. 90 tablet 11 03/30/2024   valACYclovir  (VALTREX ) 500 MG tablet Take 1 tablet (500 mg total) by mouth 2 (two) times daily. 60 tablet 4 03/30/2024   acetaminophen   (TYLENOL ) 325 MG tablet Take 650 mg by mouth every 4 (four) hours as needed for mild pain (pain score 1-3) or moderate pain (pain score 4-6).      acetaminophen -caffeine  (EXCEDRIN TENSION HEADACHE) 500-65 MG TABS per tablet Take 2 tablets by mouth 4 (four) times daily as needed (For headaches). 60 tablet 0    Boric Acid 600 MG SUPP Place 600 mg vaginally 2 (two) times daily. 60 suppository 0    cyclobenzaprine  (FLEXERIL ) 10 MG tablet Take 0.5-1 tablets (5-10 mg total) by mouth 3 (three) times daily as needed for muscle spasms. 15 tablet 0    famotidine  (PEPCID ) 40 MG tablet Take 1 tablet (40 mg total) by mouth daily. 90 tablet 3    ferrous sulfate  325 (65 FE) MG tablet Take 1 tablet (325 mg total) by mouth daily. 30 tablet 0    promethazine  (PHENERGAN ) 25 MG tablet Take 1 tablet (25 mg total) by mouth every 6 (six) hours as needed for nausea or vomiting. 30 tablet 0    tamsulosin  (FLOMAX ) 0.4 MG CAPS capsule Take 1 capsule (0.4 mg total) by mouth daily. 30 capsule 2    terconazole  (TERAZOL 7 ) 0.4 % vaginal cream Place 1 applicator vaginally at bedtime. Use for seven days 45 g 0     Review of Systems  Constitutional:  Negative for chills, fatigue and fever.  Eyes:  Negative for pain and visual disturbance.  Respiratory:  Negative for apnea, shortness of breath and wheezing.   Cardiovascular:  Negative for chest pain and palpitations.  Gastrointestinal:  Negative for abdominal pain, constipation, diarrhea, nausea and vomiting.  Genitourinary:  Positive for decreased urine volume, dysuria, flank pain, frequency, hematuria and pelvic pain. Negative for difficulty urinating, vaginal bleeding, vaginal discharge and vaginal pain.  Musculoskeletal:  Negative for back pain.  Neurological:  Negative for seizures, weakness and headaches.  Psychiatric/Behavioral:  Negative for suicidal ideas.    Physical Exam   Blood pressure 113/61, pulse (!) 124, temperature 98 F (36.7 C), temperature source Oral,  resp. rate 17, height 5' 5 (1.651 m), weight 102.1 kg, last menstrual period 06/21/2023, SpO2 94%, not currently breastfeeding.  Physical Exam Vitals and nursing note reviewed. Exam conducted with a chaperone present [place] [facility], RN).  Constitutional:      General: She is in acute distress.     Appearance: Normal appearance.  HENT:     Head: Normocephalic.  Pulmonary:     Effort: Pulmonary effort is normal.  Abdominal:     Palpations: Abdomen  is soft.     Tenderness: There is no abdominal tenderness.  Genitourinary:    General: Normal vulva.     Vagina: Vaginal discharge present.     Comments: Very tender on exam  Musculoskeletal:     Cervical back: Normal range of motion.  Skin:    General: Skin is warm and dry.  Neurological:     Mental Status: She is alert and oriented to person, place, and time.  Psychiatric:        Mood and Affect: Mood normal.    FHT: 150 bpm with moderate variability 15x15 accels no decels  Toco: quiet   Dilation: 2 Effacement (%): Thick Cervical Position: Posterior Exam by:: Shay Less Woolsey, CNM   MAU Course  Procedures Orders Placed This Encounter  Procedures   Wet prep, genital   US  RENAL   Urinalysis, Routine w reflex microscopic -Urine, Clean Catch   CBC with Differential/Platelet   Bladder scan   Insert peripheral IV   Meds ordered this encounter  Medications   acetaminophen  (OFIRMEV ) IV 1,000 mg    Is the patient UNABLE to take oral / enteral medications?:   No   HYDROmorphone  (DILAUDID ) injection 1 mg    Refill:  0    MDM - UA ordered.  - Cervix unchanged, low suspicion for Preterm labor. Pelvic exam normal.  - Given recent stent placement, consulted Dr. Watt with urology for recommendations/consult.   - Concern for acute cystitis  - Per Dr. Watt patient will likely need overnight obs for pain management and consult Uro again in the morning.MD recommended a Urine culture, IV tylenol , a broad spectrum abx, Renal US  and bladder  scan. Uncertain on pyridium  in pregnancy but may also add that on overnight.   - Reviewed plan of care with Dr. Nicholaus who is agreeable to plan. MD to place orders for patient to get on OBSC.   Assessment and Plan  -Admit to [facility] for overnight obs.   Claris CHRISTELLA Cedar, MSN CNM  03/30/2024, 6:21 PM

## 2024-03-31 DIAGNOSIS — O99891 Other specified diseases and conditions complicating pregnancy: Secondary | ICD-10-CM

## 2024-03-31 DIAGNOSIS — N200 Calculus of kidney: Principal | ICD-10-CM

## 2024-03-31 DIAGNOSIS — Z3A34 34 weeks gestation of pregnancy: Secondary | ICD-10-CM

## 2024-03-31 LAB — GC/CHLAMYDIA PROBE AMP (~~LOC~~) NOT AT ARMC
Chlamydia: NEGATIVE
Comment: NEGATIVE
Comment: NORMAL
Neisseria Gonorrhea: NEGATIVE

## 2024-03-31 MED ORDER — ONDANSETRON 4 MG PO TBDP
4.0000 mg | ORAL_TABLET | Freq: Four times a day (QID) | ORAL | Status: DC | PRN
  Administered 2024-03-31 (×2): 4 mg via ORAL
  Filled 2024-03-31 (×2): qty 1

## 2024-03-31 MED ORDER — LACTATED RINGERS IV BOLUS
500.0000 mL | Freq: Once | INTRAVENOUS | Status: AC
  Administered 2024-03-31: 500 mL via INTRAVENOUS

## 2024-03-31 MED ORDER — HYDROXYZINE HCL 50 MG PO TABS
25.0000 mg | ORAL_TABLET | Freq: Four times a day (QID) | ORAL | Status: DC | PRN
  Administered 2024-03-31: 25 mg via ORAL
  Filled 2024-03-31: qty 1

## 2024-03-31 MED ORDER — LACTATED RINGERS IV SOLN: INTRAVENOUS | Status: AC

## 2024-03-31 MED ORDER — HYDROMORPHONE HCL 2 MG PO TABS
2.0000 mg | ORAL_TABLET | Freq: Once | ORAL | Status: AC
  Administered 2024-03-31: 2 mg via ORAL
  Filled 2024-03-31: qty 1

## 2024-03-31 MED ORDER — OXYCODONE HCL 5 MG PO TABS
10.0000 mg | ORAL_TABLET | ORAL | Status: DC | PRN
  Administered 2024-03-31 – 2024-04-01 (×4): 10 mg via ORAL
  Filled 2024-03-31 (×4): qty 2

## 2024-03-31 MED ORDER — OXYCODONE HCL 5 MG PO TABS: 5.0000 mg | ORAL_TABLET | ORAL | Status: DC | PRN

## 2024-03-31 NOTE — Progress Notes (Signed)
 Patient ID: Alyssa Howell, female   DOB: 2000-10-08, 23 y.o.   MRN: [id number] FACULTY PRACTICE ANTEPARTUM(COMPREHENSIVE) NOTE  Alyssa Howell is a 23 y.o. [id number] at [redacted]w[redacted]d by early ultrasound who is admitted for renal colic .   Fetal presentation is cephalic. Length of Stay:  1  Days  Subjective: Still has flank pain Patient reports the fetal movement as active. Patient reports uterine contraction  activity as none. Patient reports  vaginal bleeding as none. Patient describes fluid per vagina as None.  Vitals:  Blood pressure (!) 100/52, pulse 99, temperature 98 F (36.7 C), temperature source Oral, resp. rate 17, height 5' 5 (1.651 m), weight 102.1 kg, last menstrual period 06/21/2023, SpO2 97%, not currently breastfeeding. Physical Examination:  General appearance - alert, well appearing, and in no distress Heart - normal rate and regular rhythm Abdomen - soft, nontender, nondistended Fundal Height:  size equals dates Extremities: extremities normal, atraumatic, no cyanosis or edema and Homans sign is negative, no sign of DVT  Membranes:intact  Fetal Monitoring:   Fetal Heart Rate A   Mode External filed at 03/31/2024 1020  Baseline Rate (A) 130 bpm filed at 03/31/2024 1020  Variability 6-25 BPM filed at 03/31/2024 1020  Accelerations 15 x 15 filed at 03/31/2024 1020  Decelerations None filed at 03/31/2024 1020    Labs:  Results for orders placed or performed during the hospital encounter of 03/30/24 (from the past 24 hours)  Wet prep, genital   Collection Time: 03/30/24  6:46 PM   Specimen: PATH Cytology Cervicovaginal Ancillary Only  Result Value Ref Range   Yeast Wet Prep HPF POC NONE SEEN NONE SEEN   Trich, Wet Prep NONE SEEN NONE SEEN   Clue Cells Wet Prep HPF POC NONE SEEN NONE SEEN   WBC, Wet Prep HPF POC >=10 (A) <10   Sperm NONE SEEN   Urinalysis, Routine w reflex microscopic -Urine, Clean Catch   Collection Time: 03/30/24  6:50 PM  Result Value Ref Range    Color, Urine YELLOW YELLOW   APPearance CLOUDY (A) CLEAR   Specific Gravity, Urine 1.019 1.005 - 1.030   pH 7.0 5.0 - 8.0   Glucose, UA NEGATIVE NEGATIVE mg/dL   Hgb urine dipstick LARGE (A) NEGATIVE   Bilirubin Urine NEGATIVE NEGATIVE   Ketones, ur NEGATIVE NEGATIVE mg/dL   Protein, ur 899 (A) NEGATIVE mg/dL   Nitrite NEGATIVE NEGATIVE   Leukocytes,Ua SMALL (A) NEGATIVE   RBC / HPF >50 0 - 5 RBC/hpf   WBC, UA 0-5 0 - 5 WBC/hpf   Bacteria, UA RARE (A) NONE SEEN   Squamous Epithelial / HPF 0-5 0 - 5 /HPF   Mucus PRESENT   CBC with Differential/Platelet   Collection Time: 03/30/24  7:59 PM  Result Value Ref Range   WBC 11.3 (H) 4.0 - 10.5 K/uL   RBC 3.45 (L) 3.87 - 5.11 MIL/uL   Hemoglobin 10.7 (L) 12.0 - 15.0 g/dL   HCT 66.1 (L) 63.9 - 53.9 %   MCV 98.0 80.0 - 100.0 fL   MCH 31.0 26.0 - 34.0 pg   MCHC 31.7 30.0 - 36.0 g/dL   RDW 83.3 (H) 88.4 - 84.4 %   Platelets 287 150 - 400 K/uL   nRBC 0.0 0.0 - 0.2 %   Neutrophils Relative % 70 %   Neutro Abs 8.0 (H) 1.7 - 7.7 K/uL   Lymphocytes Relative 17 %   Lymphs Abs 2.0 0.7 - 4.0 K/uL   Monocytes Relative 8 %  Monocytes Absolute 0.9 0.1 - 1.0 K/uL   Eosinophils Relative 2 %   Eosinophils Absolute 0.2 0.0 - 0.5 K/uL   Basophils Relative 1 %   Basophils Absolute 0.1 0.0 - 0.1 K/uL   Immature Granulocytes 2 %   Abs Immature Granulocytes 0.18 (H) 0.00 - 0.07 K/uL  Type and screen [facility]   Collection Time: 03/30/24  8:00 PM  Result Value Ref Range   ABO/RH(D) A POS    Antibody Screen NEG    Sample Expiration      04/02/2024,2359 Performed at [facility], [address] [place], [place] [address]      Medications:  Scheduled  docusate sodium   100 mg Oral Daily   prenatal multivitamin  1 tablet Oral Q1200   sodium chloride  flush  3 mL Intravenous Q12H   tamsulosin   0.4 mg Oral Daily   valACYclovir   500 mg Oral BID   I have reviewed the patient's current medications.  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 Watt requested urology consult for today and message was sent  Lynwood Solomons 03/31/2024,11:03 AM

## 2024-03-31 NOTE — Consult Note (Signed)
 Urology Consult Note   Requesting Attending Physician:  Eveline Lynwood MATSU, MD Service Providing Consult: Urology  Consulting Attending:    Reason for Consult: Stent colic and hematuria  HPI: Alyssa Howell is seen in consultation for reasons noted above at the request of Eveline Lynwood MATSU, MD. Patient is a 23 y.o. female initially presented to [facility] [place], ED with complaints of intractable flank pain.  CTA PE collected on 03/27/2024 showed 7 mm left UVJ stone and 2 mm left upper pole stone.  Available options were discussed and shared decision was made to proceed with left ureteral stent placement at the time.  Patient was not forthcoming about her opiate dependence at that time and is struggling to keep her pain under control at home.  She presents to [name] again today with complaint of transient hematuria and stent colic.  I met her in her room and had a candid conversation regarding her case and plan.  She was resting comfortably in bed.  All questions were answered to her satisfaction.  ------------------  Assessment:   23 y.o. female [redacted] weeks pregnant with 7 mm left UVJ stone-s/p ureteral stent on 12/4, now with stent colic and opioid dependency   Recommendations: # Stent colic Discussed available plans including bedside stent removal, percutaneous nephrostomy tube, or proceeding conservatively.  We discussed the larger problem of keeping her pain under control with a reasonable amount of narcotics, considering her dependence and tolerance.  It seems less likely that her pain will be able to be well-managed, and she will be able to be safely weaned prior to her delivery.  She has elected to keep her stent.  Discussed with primary OB who will engage a neonatologist and pediatrician to assist with navigating what would likely be opioid dependence for mother and baby after delivery. No surgical indications at this time.  Definitive stone management as previously planned, after delivery.   Please call with questions or concerns.  Case and plan discussed with Dr. Sherrilee  Past Medical History: Past Medical History:  Diagnosis Date   ADHD    Anemia    Chlamydia 2022   Depression    GERD (gastroesophageal reflux disease) 10/09/2019   HSV-2 infection    per pt/ seen in ED [facility]   Kidney stones    Seizures (HCC)    x 1 in 5th grade after taking ability. No other episodes.   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   Vaginal pain at introitus  Referred to Pelvic Floor PT      Past Surgical History:  Past Surgical History:  Procedure Laterality Date   CYSTOSCOPY W/ URETERAL STENT PLACEMENT Left 03/27/2024   Procedure: CYSTOSCOPY, WITH RETROGRADE PYELOGRAM AND URETERAL STENT INSERTION;  Surgeon: Selma Donnice SAUNDERS, MD;  Location: [facility] OR;  Service: Urology;  Laterality: Left;   CYSTOSCOPY/URETEROSCOPY/HOLMIUM LASER/STENT PLACEMENT Right 08/02/2020   Procedure: CYSTOSCOPY/URETEROSCOPY/HOLMIUM LASER/STENT PLACEMENT;  Surgeon: Penne Knee, MD;  Location: [facility] ORS;  Service: Urology;  Laterality: Right;   DILATION AND CURETTAGE OF UTERUS N/A 08/12/2022   Procedure: DILATATION AND CURETTAGE, ULTRASOUND GUIDED;  Surgeon: Zina Jerilynn LABOR, MD;  Location: [facility] OR;  Service: Gynecology;  Laterality: N/A;   FOOT SURGERY Bilateral    metal screws in toes,2020   TONSILLECTOMY      Medication: Current Facility-Administered Medications  Medication Dose Route Frequency Provider Last Rate Last Admin   0.9 %  sodium chloride  infusion  250 mL Intravenous PRN Nicholaus Sor  M, MD       acetaminophen  (TYLENOL ) tablet 650 mg  650 mg Oral Q4H PRN Nicholaus Burnard HERO, MD   650 mg at 03/31/24 0931   calcium  carbonate (TUMS - dosed in mg elemental calcium ) chewable tablet 400 mg of elemental calcium   2 tablet Oral Q4H PRN Nicholaus Burnard HERO, MD   400 mg of elemental calcium  at 03/30/24 2120   docusate sodium  (COLACE) capsule 100 mg  100 mg Oral Daily Nicholaus Burnard HERO, MD   100  mg at 03/31/24 9068   hydrOXYzine  (ATARAX ) tablet 25 mg  25 mg Oral Q6H PRN Arnold, James G, MD   25 mg at 03/31/24 1344   lactated ringers  infusion  125 mL/hr Intravenous On Call to OR Nicholaus Burnard HERO, MD       lactated ringers  infusion   Intravenous Continuous Eveline Lynwood MATSU, MD 125 mL/hr at 03/31/24 1333 New Bag at 03/31/24 1333   ondansetron  (ZOFRAN -ODT) disintegrating tablet 4 mg  4 mg Oral Q6H PRN Anyanwu, Ugonna A, MD   4 mg at 03/31/24 1311   oxyCODONE  (Oxy IR/ROXICODONE ) immediate release tablet 5 mg  5 mg Oral Q4H PRN Arnold, James G, MD       piperacillin -tazobactam (ZOSYN ) IVPB 3.375 g  3.375 g Intravenous Q8H Nicholaus Burnard HERO, MD 12.5 mL/hr at 03/31/24 0621 3.375 g at 03/31/24 9378   prenatal multivitamin tablet 1 tablet  1 tablet Oral Q1200 Nicholaus Burnard HERO, MD   1 tablet at 03/31/24 1132   sodium chloride  flush (NS) 0.9 % injection 3 mL  3 mL Intravenous Q12H Nicholaus Burnard HERO, MD   3 mL at 03/31/24 0932   sodium chloride  flush (NS) 0.9 % injection 3 mL  3 mL Intravenous PRN Nicholaus Burnard HERO, MD       tamsulosin  (FLOMAX ) capsule 0.4 mg  0.4 mg Oral Daily Nicholaus Burnard HERO, MD   0.4 mg at 03/31/24 9068   valACYclovir  (VALTREX ) tablet 500 mg  500 mg Oral BID Nicholaus Burnard HERO, MD   500 mg at 03/31/24 9068    Allergies: Allergies  Allergen Reactions   Abilify [Aripiprazole] Other (See Comments)    Seizures   Metronidazole  Hives and Other (See Comments)    Inguinal area    Social History: Social History   Tobacco Use   Smoking status: Never   Smokeless tobacco: Never  Vaping Use   Vaping status: Never Used  Substance Use Topics   Alcohol use: Not Currently   Drug use: Not Currently    Types: Marijuana    Comment: last used the end of September 2022 as of 02/07/2021    Family History Family History  Problem Relation Age of Onset   Hepatitis C Mother    Valvular heart disease Mother    Other Father        no contact in over 87yrs   Cancer Maternal Grandmother    Breast cancer  Maternal Grandmother    Diabetes Other    Heart attack Other    Asthma Neg Hx    Heart disease Neg Hx    Hypertension Neg Hx     Review of Systems  Genitourinary:  Positive for flank pain and hematuria. Negative for dysuria, frequency and urgency.     Objective   Vital signs in last 24 hours: BP (!) 102/57   Pulse (!) 103   Temp 98.3 F (36.8 C) (Oral)   Resp 18   Ht 5' 5 (1.651 m)  Wt 102.1 kg   LMP 06/21/2023 (Approximate)   SpO2 97%   BMI 37.46 kg/m   Physical Exam General: A&O, resting, appropriate HEENT: [place]/AT Pulmonary: Normal work of breathing Cardiovascular: no cyanosis    Most Recent Labs: Lab Results  Component Value Date   WBC 11.3 (H) 03/30/2024   HGB 10.7 (L) 03/30/2024   HCT 33.8 (L) 03/30/2024   PLT 287 03/30/2024    Lab Results  Component Value Date   NA 136 03/27/2024   K 3.8 03/27/2024   CL 107 03/27/2024   CO2 22 03/27/2024   BUN 10 03/27/2024   CREATININE 0.73 03/27/2024   CALCIUM  8.6 (L) 03/27/2024    No results found for: INR, APTT   Urine Culture: @[id number] (laburin,org,[id number],[id number])@   IMAGING: US  RENAL Result Date: 03/30/2024 EXAM: US  Retroperitoneum Complete, Renal. 03/30/2024 09:20:05 PM TECHNIQUE: Real-time ultrasonography of the retroperitoneum renal was performed. COMPARISON: None available CLINICAL HISTORY: Left flank pain. FINDINGS: LIMITATIONS: Slightly limited evaluation due to gravid uterus. RIGHT KIDNEY/URETER: The right kidney measures 11.7 x 3.9 x 5.8 cm, volume 139 ml. Normal cortical echogenicity. No hydronephrosis. No calculus. No mass. LEFT KIDNEY/URETER: The left kidney measures 10.6 x 6 x 5.8 cm, 190 ml. Normal cortical echogenicity. Mild left hydronephrosis. 4 mm calcified stone within the left kidney. 9 mm calcified stone in the expected region of the left ureterovesical junction. No mass. BLADDER: Unremarkable appearance of the bladder. IMPRESSION: 1. Mild left hydronephrosis, likely secondary to a 9  mm calcified stone in the expected region of the left ureterovesical junction. 2. Nonobstructing 4 mm calcified stone within the left kidney. Electronically signed by: Morgane Naveau MD 03/30/2024 09:26 PM EST RP Workstation: [id number]    ------  Ole Bourdon, NP Pager: [phone]   Please contact the urology consult pager with any further questions/concerns.

## 2024-04-01 ENCOUNTER — Encounter (HOSPITAL_COMMUNITY): Payer: Self-pay

## 2024-04-01 ENCOUNTER — Encounter: Admitting: Obstetrics

## 2024-04-01 DIAGNOSIS — N200 Calculus of kidney: Secondary | ICD-10-CM

## 2024-04-01 DIAGNOSIS — O26893 Other specified pregnancy related conditions, third trimester: Secondary | ICD-10-CM

## 2024-04-01 DIAGNOSIS — R109 Unspecified abdominal pain: Secondary | ICD-10-CM

## 2024-04-01 DIAGNOSIS — Z3A35 35 weeks gestation of pregnancy: Secondary | ICD-10-CM

## 2024-04-01 LAB — COMPREHENSIVE METABOLIC PANEL WITH GFR
ALT: 25 U/L (ref 0–44)
AST: 17 U/L (ref 15–41)
Albumin: 2.6 g/dL — ABNORMAL LOW (ref 3.5–5.0)
Alkaline Phosphatase: 120 U/L (ref 38–126)
Anion gap: 9 (ref 5–15)
BUN: 7 mg/dL (ref 6–20)
CO2: 23 mmol/L (ref 22–32)
Calcium: 8.4 mg/dL — ABNORMAL LOW (ref 8.9–10.3)
Chloride: 104 mmol/L (ref 98–111)
Creatinine, Ser: 0.71 mg/dL (ref 0.44–1.00)
GFR, Estimated: >60 mL/min (ref >60)
Glucose, Bld: 81 mg/dL (ref 70–99)
Potassium: 4 mmol/L (ref 3.5–5.1)
Sodium: 136 mmol/L (ref 135–145)
Total Bilirubin: 0.5 mg/dL (ref 0.0–1.2)
Total Protein: 6.2 g/dL — ABNORMAL LOW (ref 6.5–8.1)

## 2024-04-01 LAB — CBC
HCT: 33 % — ABNORMAL LOW (ref 36.0–46.0)
Hemoglobin: 10.4 g/dL — ABNORMAL LOW (ref 12.0–15.0)
MCH: 30.8 pg (ref 26.0–34.0)
MCHC: 31.5 g/dL (ref 30.0–36.0)
MCV: 97.6 fL (ref 80.0–100.0)
Platelets: 272 K/uL (ref 150–400)
RBC: 3.38 MIL/uL — ABNORMAL LOW (ref 3.87–5.11)
RDW: 16.4 % — ABNORMAL HIGH (ref 11.5–15.5)
WBC: 11.7 K/uL — ABNORMAL HIGH (ref 4.0–10.5)
nRBC: 0 % (ref 0.0–0.2)

## 2024-04-01 MED ORDER — HYDROMORPHONE HCL 1 MG/ML IJ SOLN
1.0000 mg | INTRAMUSCULAR | Status: DC | PRN
  Administered 2024-04-01 – 2024-04-02 (×6): 2 mg via INTRAVENOUS
  Administered 2024-04-03: 1 mg via INTRAVENOUS
  Administered 2024-04-03 – 2024-04-04 (×4): 2 mg via INTRAVENOUS
  Filled 2024-04-01 (×7): qty 2
  Filled 2024-04-01: qty 1
  Filled 2024-04-01 (×3): qty 2

## 2024-04-01 MED ORDER — SODIUM CHLORIDE 0.9 % IV SOLN
5.0000 10*6.[IU] | Freq: Once | INTRAVENOUS | Status: AC
  Administered 2024-04-01: 5 10*6.[IU] via INTRAVENOUS
  Filled 2024-04-01: qty 5

## 2024-04-01 MED ORDER — LACTATED RINGERS IV SOLN: INTRAVENOUS | Status: DC

## 2024-04-01 MED ORDER — LACTATED RINGERS IV SOLN
500.0000 mL | INTRAVENOUS | Status: DC | PRN
  Administered 2024-04-02 (×2): 500 mL via INTRAVENOUS

## 2024-04-01 MED ORDER — OXYTOCIN BOLUS FROM INFUSION
333.0000 mL | Freq: Once | INTRAVENOUS | Status: AC
  Administered 2024-04-02: 333 mL via INTRAVENOUS

## 2024-04-01 MED ORDER — ONDANSETRON HCL 4 MG/2ML IJ SOLN
4.0000 mg | Freq: Four times a day (QID) | INTRAMUSCULAR | Status: DC | PRN
  Administered 2024-04-02: 4 mg via INTRAVENOUS
  Filled 2024-04-01: qty 2

## 2024-04-01 MED ORDER — MISOPROSTOL 50MCG HALF TABLET
50.0000 ug | ORAL_TABLET | ORAL | Status: DC | PRN
  Administered 2024-04-01 – 2024-04-02 (×2): 50 ug via ORAL
  Filled 2024-04-01 (×2): qty 1

## 2024-04-01 MED ORDER — SOD CITRATE-CITRIC ACID 500-334 MG/5ML PO SOLN: 30.0000 mL | ORAL | Status: DC | PRN

## 2024-04-01 MED ORDER — MISOPROSTOL 25 MCG QUARTER TABLET
25.0000 ug | ORAL_TABLET | ORAL | Status: DC | PRN
  Administered 2024-04-01: 25 ug via VAGINAL
  Filled 2024-04-01: qty 1

## 2024-04-01 MED ORDER — TERBUTALINE SULFATE 1 MG/ML IJ SOLN: 0.2500 mg | Freq: Once | INTRAMUSCULAR | Status: DC | PRN

## 2024-04-01 MED ORDER — FENTANYL CITRATE (PF) 100 MCG/2ML IJ SOLN
50.0000 ug | INTRAMUSCULAR | Status: DC | PRN
  Administered 2024-04-02 (×3): 100 ug via INTRAVENOUS
  Filled 2024-04-01 (×3): qty 2

## 2024-04-01 MED ORDER — OXYTOCIN-SODIUM CHLORIDE 30-0.9 UT/500ML-% IV SOLN
2.5000 [IU]/h | INTRAVENOUS | Status: DC
  Administered 2024-04-02: 2.5 [IU]/h via INTRAVENOUS
  Filled 2024-04-01: qty 500

## 2024-04-01 MED ORDER — DIPHENHYDRAMINE HCL 50 MG/ML IJ SOLN
25.0000 mg | Freq: Four times a day (QID) | INTRAMUSCULAR | Status: DC | PRN
  Administered 2024-04-01 – 2024-04-02 (×2): 25 mg via INTRAVENOUS
  Filled 2024-04-01: qty 1

## 2024-04-01 MED ORDER — OXYCODONE-ACETAMINOPHEN 5-325 MG PO TABS: 1.0000 | ORAL_TABLET | ORAL | Status: DC | PRN

## 2024-04-01 MED ORDER — LIDOCAINE HCL (PF) 1 % IJ SOLN: 30.0000 mL | INTRAMUSCULAR | Status: DC | PRN

## 2024-04-01 MED ORDER — ACETAMINOPHEN 325 MG PO TABS
650.0000 mg | ORAL_TABLET | ORAL | Status: DC | PRN
  Administered 2024-04-01: 650 mg via ORAL
  Filled 2024-04-01: qty 2

## 2024-04-01 MED ORDER — OXYCODONE-ACETAMINOPHEN 5-325 MG PO TABS: 2.0000 | ORAL_TABLET | ORAL | Status: DC | PRN

## 2024-04-01 MED ORDER — PENICILLIN G POT IN DEXTROSE 60000 UNIT/ML IV SOLN
3.0000 10*6.[IU] | INTRAVENOUS | Status: DC
  Administered 2024-04-02 (×2): 3 10*6.[IU] via INTRAVENOUS
  Filled 2024-04-01 (×3): qty 50

## 2024-04-01 NOTE — Plan of Care (Signed)
  Problem: Education: Goal: Knowledge of disease or condition will improve Outcome: Progressing Goal: Knowledge of the prescribed therapeutic regimen will improve Outcome: Progressing   Problem: Education: Goal: Knowledge of General Education information will improve Description: Including pain rating scale, medication(s)/side effects and non-pharmacologic comfort measures Outcome: Progressing   Problem: Health Behavior/Discharge Planning: Goal: Ability to manage health-related needs will improve Outcome: Progressing   Problem: Clinical Measurements: Goal: Will remain free from infection Outcome: Progressing Goal: Diagnostic test results will improve Outcome: Progressing   Problem: Coping: Goal: Level of anxiety will decrease Outcome: Progressing

## 2024-04-01 NOTE — Progress Notes (Signed)
 Patient ID: Alyssa Howell, female   DOB: 2001/04/18, 23 y.o.   MRN: [id number] FACULTY PRACTICE ANTEPARTUM(COMPREHENSIVE) NOTE  Alyssa Howell is a 23 y.o. [id number] at [redacted]w[redacted]d by early ultrasound who is admitted for pain management after ureteral stent placement on 12/4 with renal stones.   Fetal presentation is cephalic. Length of Stay:  2  Days  Subjective: Still with poor pain control Patient reports the fetal movement as active. Patient reports uterine contraction  activity as none. Patient reports  vaginal bleeding as none. Patient describes fluid per vagina as None.  Vitals:  Blood pressure 107/61, pulse 90, temperature 98.1 F (36.7 C), temperature source Oral, resp. rate 17, height 5' 5 (1.651 m), weight 102.1 kg, last menstrual period 06/21/2023, SpO2 97%, not currently breastfeeding. Physical Examination:  General appearance - alert, well appearing, and in no distress Heart - normal rate and regular rhythm Abdomen - soft, nontender, nondistended Fundal Height:  size equals dates. Extremities: extremities normal, atraumatic, no cyanosis or edema and Homans sign is negative, no sign of DVT  Membranes:intact  Fetal Monitoring:   Fetal Heart Rate A   Mode External filed at 03/31/2024 1331  Baseline Rate (A) 130 bpm filed at 03/31/2024 1331  Variability 6-25 BPM filed at 03/31/2024 1331  Accelerations 15 x 15 filed at 03/31/2024 1331  Decelerations None filed at 03/31/2024 1331   Labs:  No results found for this or any previous visit (from the past 24 hours).   Medications:  Scheduled  docusate sodium   100 mg Oral Daily   prenatal multivitamin  1 tablet Oral Q1200   sodium chloride  flush  3 mL Intravenous Q12H   tamsulosin   0.4 mg Oral Daily   valACYclovir   500 mg Oral BID   I have reviewed the patient's current medications.  ASSESSMENT: Patient Active Problem List   Diagnosis Date Noted   Kidney stones 03/31/2024   Trichomoniasis 03/28/2024   [redacted] weeks gestation of  pregnancy 03/28/2024   Renal calculi 03/27/2024   Anemia in pregnancy, third trimester 02/24/2024   Herpes simplex vulvovaginitis 01/06/2024   Obesity affecting pregnancy, antepartum 12/31/2023   History of preterm delivery, currently pregnant 12/14/2023   Family history of congenital heart defect-prev child double aortic valve 12/14/2023   Supervision of high risk pregnancy, antepartum 09/18/2023   Recurrent nephrolithiasis 11/26/2020   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 02/24/2019   Depression, recurrent 02/24/2019    PLAN: Working on pain management with urology following. MFM consulted to discuss timing for delivery  Lynwood Solomons 04/01/2024,11:14 AM

## 2024-04-01 NOTE — Consult Note (Signed)
 MFM Consult Note  Alyssa Howell is a 23 year old gravida 5 para 2 currently at 35 weeks and 0 days.  She has been hospitalized due to intractable pain related to kidney stones and a ureteral stent that was placed last week for treatment of her kidney stones.    The patient has been dealing with significant pain related to the kidney stones since earlier in her pregnancy.  She reports that she has been taking oxycodone  and Dilaudid  on and off for the past 4 months.  She had a CT scan performed 5 days ago that showed a 2 mm stone over the upper pole of the left kidney and a 7 mm stone just above the UVJ.  A ureteral stent was placed by urology 5 days ago.  The patient reports that her pain is now intolerable.    She is receiving narcotic pain medications for pain relief.  However, she reports that the pain returns about 30 minutes after receiving the pain medicine.  All she can do now is lay in bed all day and she cannot move because of the pain.  She reports feeling fetal movements throughout the day.  She reports that due to her pregnancy and fetal concerns, she cannot receive definitive treatment for her kidney stones until after delivery.  Therefore, she would like to proceed with delivery as soon as possible so that she can get definitive treatment for her kidney stones.  She understands that there is a risk that her baby may require a NICU admission for delivery within the next week or two.  She is willing to accept that risk to obtain definitive treatment and pain relief.  She had a prior 34-week delivery.  That child is doing well today and therefore she is not concerned about another preterm delivery.  She had a growth ultrasound performed 3 weeks ago that showed an EFW of 4 pounds 6 ounces (44th percentile).  As she has been taking prolonged narcotic pain medications, she understands that there could be a possibility that her baby may need to be weaned off of opioids after birth.  As she  cannot perform daily functions due to significant pain and is now bedridden requiring continuous narcotic pain medications, delivery is indicated so that she can obtain definitive treatment for the cause of her pain.  As she is beyond 34 weeks, a course of antenatal corticosteroids is not necessary prior to delivery.    A NICU consult should be obtained prior to delivery.    The patient and her partner stated that all of their questions were answered today and they are happy to proceed with delivery as soon as possible so that she can get definitive treatment.  A total of 60 minutes was spent counseling the patient, reviewing her chart, and coordinating her care.

## 2024-04-02 ENCOUNTER — Encounter (HOSPITAL_COMMUNITY): Payer: Self-pay | Admitting: Obstetrics & Gynecology

## 2024-04-02 ENCOUNTER — Inpatient Hospital Stay (HOSPITAL_COMMUNITY): Admitting: Anesthesiology

## 2024-04-02 DIAGNOSIS — O99820 Streptococcus B carrier state complicating pregnancy: Secondary | ICD-10-CM

## 2024-04-02 DIAGNOSIS — O9832 Other infections with a predominantly sexual mode of transmission complicating childbirth: Secondary | ICD-10-CM

## 2024-04-02 DIAGNOSIS — O99214 Obesity complicating childbirth: Secondary | ICD-10-CM

## 2024-04-02 DIAGNOSIS — Z3A35 35 weeks gestation of pregnancy: Secondary | ICD-10-CM

## 2024-04-02 LAB — PROTEIN / CREATININE RATIO, URINE
Creatinine, Urine: 83 mg/dL
Protein Creatinine Ratio: 1.7 mg/mg{creat} — ABNORMAL HIGH (ref 0.00–0.15)
Total Protein, Urine: 141 mg/dL

## 2024-04-02 LAB — SYPHILIS: RPR W/REFLEX TO RPR TITER AND TREPONEMAL ANTIBODIES, TRADITIONAL SCREENING AND DIAGNOSIS ALGORITHM: RPR Ser Ql: NONREACTIVE

## 2024-04-02 MED ORDER — PRENATAL MULTIVITAMIN CH
1.0000 | ORAL_TABLET | Freq: Every day | ORAL | Status: DC
  Administered 2024-04-02 – 2024-04-03 (×2): 1 via ORAL
  Filled 2024-04-02 (×2): qty 1

## 2024-04-02 MED ORDER — TETANUS-DIPHTH-ACELL PERTUSSIS 5-2-15.5 LF-MCG/0.5 IM SUSP: 0.5000 mL | Freq: Once | INTRAMUSCULAR | Status: DC

## 2024-04-02 MED ORDER — SENNOSIDES-DOCUSATE SODIUM 8.6-50 MG PO TABS
2.0000 | ORAL_TABLET | Freq: Every day | ORAL | Status: DC
  Administered 2024-04-03: 2 via ORAL
  Filled 2024-04-02: qty 2

## 2024-04-02 MED ORDER — IBUPROFEN 600 MG PO TABS
600.0000 mg | ORAL_TABLET | Freq: Four times a day (QID) | ORAL | Status: DC
  Administered 2024-04-02 – 2024-04-04 (×7): 600 mg via ORAL
  Filled 2024-04-02 (×8): qty 1

## 2024-04-02 MED ORDER — COCONUT OIL OIL: 1.0000 | TOPICAL_OIL | Status: DC | PRN

## 2024-04-02 MED ORDER — DIBUCAINE (PERIANAL) 1 % EX OINT: 1.0000 | TOPICAL_OINTMENT | CUTANEOUS | Status: DC | PRN

## 2024-04-02 MED ORDER — SIMETHICONE 80 MG PO CHEW: 80.0000 mg | CHEWABLE_TABLET | ORAL | Status: DC | PRN

## 2024-04-02 MED ORDER — ONDANSETRON HCL 4 MG PO TABS: 4.0000 mg | ORAL_TABLET | ORAL | Status: DC | PRN

## 2024-04-02 MED ORDER — METHYLERGONOVINE MALEATE 0.2 MG/ML IJ SOLN: 0.2000 mg | Freq: Once | INTRAMUSCULAR | Status: AC

## 2024-04-02 MED ORDER — FENTANYL-BUPIVACAINE-NACL 0.5-0.125-0.9 MG/250ML-% EP SOLN
12.0000 mL/h | EPIDURAL | Status: DC | PRN
  Administered 2024-04-02: 12 mL/h via EPIDURAL
  Filled 2024-04-02: qty 250

## 2024-04-02 MED ORDER — DIPHENHYDRAMINE HCL 25 MG PO CAPS
25.0000 mg | ORAL_CAPSULE | Freq: Four times a day (QID) | ORAL | Status: DC | PRN
  Administered 2024-04-03: 25 mg via ORAL
  Filled 2024-04-02: qty 1

## 2024-04-02 MED ORDER — TRANEXAMIC ACID-NACL 1000-0.7 MG/100ML-% IV SOLN: 1000.0000 mg | Freq: Once | INTRAVENOUS | Status: AC

## 2024-04-02 MED ORDER — LACTATED RINGERS IV SOLN: 500.0000 mL | Freq: Once | INTRAVENOUS | Status: DC

## 2024-04-02 MED ORDER — METHYLERGONOVINE MALEATE 0.2 MG/ML IJ SOLN
INTRAMUSCULAR | Status: AC
  Administered 2024-04-02: 0.2 mg via INTRAMUSCULAR
  Filled 2024-04-02: qty 1

## 2024-04-02 MED ORDER — EPHEDRINE 5 MG/ML INJ: 10.0000 mg | INTRAVENOUS | Status: DC | PRN

## 2024-04-02 MED ORDER — WITCH HAZEL-GLYCERIN EX PADS: 1.0000 | MEDICATED_PAD | CUTANEOUS | Status: DC | PRN

## 2024-04-02 MED ORDER — ONDANSETRON HCL 4 MG/2ML IJ SOLN
4.0000 mg | INTRAMUSCULAR | Status: DC | PRN
  Administered 2024-04-02 – 2024-04-03 (×2): 4 mg via INTRAVENOUS
  Filled 2024-04-02 (×2): qty 2

## 2024-04-02 MED ORDER — LIDOCAINE HCL (PF) 1 % IJ SOLN
INTRAMUSCULAR | Status: DC | PRN
  Administered 2024-04-02: 10 mL via EPIDURAL

## 2024-04-02 MED ORDER — PHENYLEPHRINE 80 MCG/ML (10ML) SYRINGE FOR IV PUSH (FOR BLOOD PRESSURE SUPPORT)
80.0000 ug | PREFILLED_SYRINGE | INTRAVENOUS | Status: DC | PRN
  Filled 2024-04-02: qty 10

## 2024-04-02 MED ORDER — ZOLPIDEM TARTRATE 5 MG PO TABS: 5.0000 mg | ORAL_TABLET | Freq: Every evening | ORAL | Status: DC | PRN

## 2024-04-02 MED ORDER — BENZOCAINE-MENTHOL 20-0.5 % EX AERO: 1.0000 | INHALATION_SPRAY | CUTANEOUS | Status: DC | PRN

## 2024-04-02 MED ORDER — PHENYLEPHRINE 80 MCG/ML (10ML) SYRINGE FOR IV PUSH (FOR BLOOD PRESSURE SUPPORT): 80.0000 ug | PREFILLED_SYRINGE | INTRAVENOUS | Status: DC | PRN

## 2024-04-02 MED ORDER — TRANEXAMIC ACID-NACL 1000-0.7 MG/100ML-% IV SOLN
INTRAVENOUS | Status: AC
  Administered 2024-04-02: 1000 mg via INTRAVENOUS
  Filled 2024-04-02: qty 100

## 2024-04-02 NOTE — Anesthesia Procedure Notes (Signed)
 Epidural Patient location during procedure: OB Start time: 04/02/2024 5:40 AM End time: 04/02/2024 5:50 AM  Staffing Anesthesiologist: Niels Marien CROME, MD Performed: anesthesiologist   Preanesthetic Checklist Completed: patient identified, IV checked, risks and benefits discussed, monitors and equipment checked, pre-op evaluation and timeout performed  Epidural Patient position: sitting Prep: DuraPrep and site prepped and draped Patient monitoring: continuous pulse ox, blood pressure, heart rate and cardiac monitor Approach: midline Location: L3-L4 Injection technique: LOR air  Needle:  Needle type: Tuohy  Needle gauge: 17 G Needle length: 9 cm Needle insertion depth: 6 cm Catheter type: closed end flexible Catheter size: 19 Gauge Catheter at skin depth: 11 cm Test dose: negative  Assessment Sensory level: T8 Events: blood not aspirated, no cerebrospinal fluid, injection not painful, no injection resistance, no paresthesia and negative IV test  Additional Notes Patient identified. Risks/Benefits/Options discussed with patient including but not limited to bleeding, infection, nerve damage, paralysis, failed block, incomplete pain control, headache, blood pressure changes, nausea, vomiting, reactions to medication both or allergic, itching and postpartum back pain. Confirmed with bedside nurse the patient's most recent platelet count. Confirmed with patient that they are not currently taking any anticoagulation, have any bleeding history or any family history of bleeding disorders. Patient expressed understanding and wished to proceed. All questions were answered. Sterile technique was used throughout the entire procedure. Please see nursing notes for vital signs. Test dose was given through epidural catheter and negative prior to continuing to dose epidural or start infusion. Warning signs of high block given to the patient including shortness of breath, tingling/numbness in  hands, complete motor block, or any concerning symptoms with instructions to call for help. Patient was given instructions on fall risk and not to get out of bed. All questions and concerns addressed with instructions to call with any issues or inadequate analgesia.  Reason for block:procedure for pain

## 2024-04-02 NOTE — Discharge Summary (Signed)
 Postpartum Discharge Summary  Date of Service updated***     Patient Name: Alyssa Howell DOB: May 23, 2000 MRN: [id number]  Date of admission: 03/30/2024 Delivery date:04/02/2024 Delivering provider:   Date of discharge: 04/02/2024  Admitting diagnosis: Kidney stones [N20.0] Intrauterine pregnancy: [redacted]w[redacted]d     Secondary diagnosis:  Principal Problem:   Kidney stones Active Problems:   Abdominal pain during pregnancy in third trimester   [redacted] weeks gestation of pregnancy  Additional problems: ***    Discharge diagnosis: Preterm Pregnancy Delivered                                              Post partum procedures:{Postpartum procedures:23558} Augmentation: AROM, Cytotec , and IP Foley Complications: None  Hospital course: Induction of Labor With Vaginal Delivery   23 y.o. yo [id number] at [redacted]w[redacted]d was admitted to the hospital 03/30/2024 for induction of labor.  Indication for induction: maternal renal calculi/need for treatment/poor pain management.  Patient had an uncomplicated labor course. Membrane Rupture Time/Date: 4:25 AM,04/02/2024  Delivery Method:Vaginal, Spontaneous Operative Delivery:N/A Episiotomy:   Lacerations:    Details of delivery can be found in separate delivery note.  Patient had a postpartum course complicated by***. Patient is discharged home 04/02/24.  Newborn Data: Birth date:04/02/2024 Birth time:8:25 AM Gender:Female Living status:  Apgars: ,  Weight:   Magnesium  Sulfate received: No BMZ received: No Rhophylac:No MMR:No T-DaP:Given prenatally Flu: Yes RSV Vaccine received: No Transfusion:No  Immunizations received: Immunization History  Administered Date(s) Administered   DTaP 08/06/2001, 10/22/2001, 01/06/2002, 09/17/2002   Dtap, Unspecified 11/21/2005   HIB, Unspecified 08/06/2001, 10/22/2001, 01/06/2002, 09/17/2002   HPV 9-valent 05/31/2015   HPV Quadrivalent 08/06/2013   Hep A, Unspecified 09/14/2006   Hep B, Unspecified 07-10-01,  06/26/2001, 07/10/2002   Hepatitis A, Ped/Adol-2 Dose 11/06/2007   IPV 08/06/2001, 10/22/2001, 07/10/2002   Influenza, Seasonal, Injecte, Preservative Fre 02/04/2024   Influenza,inj,Quad PF,6+ Mos 04/11/2013, 05/31/2015, 02/24/2019, 01/13/2021, 02/28/2022   MMR 07/10/2002, 11/21/2005   Meningococcal Conjugate 08/06/2013   Meningococcal Mcv4o 02/24/2019   Moderna Covid-19 Vaccine Bivalent Booster 55yrs & up 04/25/2021   PFIZER Comirnaty(Gray Top)Covid-19 Tri-Sucrose Vaccine 09/13/2020, 10/27/2020   Pfizer Covid-19 Vaccine Bivalent Booster 2yrs & up 03/14/2021   Pneumococcal Conjugate PCV 7 08/06/2001, 10/22/2001, 01/06/2002, 07/28/2003   Polio, Unspecified 11/21/2005   Tdap 03/12/2012, 02/04/2021, 06/15/2022   Varicella 07/10/2002, 11/21/2005    Physical exam  Vitals:   04/02/24 0633 04/02/24 0654 04/02/24 0700 04/02/24 0730  BP: (!) 99/56 (!) 115/55 (!) 114/56 (!) 111/59  Pulse: 90 89 93 86  Resp:      Temp:      TempSrc:      SpO2:      Weight:      Height:       General: {Exam; general:21111117} Lochia: {Desc; appropriate/inappropriate:30686::appropriate} Uterine Fundus: {Desc; firm/soft:30687} Incision: {Exam; incision:21111123} DVT Evaluation: {Exam; dvt:2111122} Labs: Lab Results  Component Value Date   WBC 11.7 (H) 04/01/2024   HGB 10.4 (L) 04/01/2024   HCT 33.0 (L) 04/01/2024   MCV 97.6 04/01/2024   PLT 272 04/01/2024      Latest Ref Rng & Units 04/01/2024   10:17 PM  CMP  Glucose 70 - 99 mg/dL 81   BUN 6 - 20 mg/dL 7   Creatinine 9.55 - 8.99 mg/dL 9.28   Sodium 864 - 854 mmol/L 136   Potassium  3.5 - 5.1 mmol/L 4.0   Chloride 98 - 111 mmol/L 104   CO2 22 - 32 mmol/L 23   Calcium  8.9 - 10.3 mg/dL 8.4   Total Protein 6.5 - 8.1 g/dL 6.2   Total Bilirubin 0.0 - 1.2 mg/dL 0.5   Alkaline Phos 38 - 126 U/L 120   AST 15 - 41 U/L 17   ALT 0 - 44 U/L 25    Edinburgh Score:    12/14/2023   10:34 AM  Edinburgh Postnatal Depression Scale Screening Tool  I  have been able to laugh and see the funny side of things. 0   I have looked forward with enjoyment to things. 0   I have blamed myself unnecessarily when things went wrong. 0   I have been anxious or worried for no good reason. 2   I have felt scared or panicky for no good reason. 0   Things have been getting on top of me. 1   I have been so unhappy that I have had difficulty sleeping. 0   I have felt sad or miserable. 0   I have been so unhappy that I have been crying. 1   The thought of harming myself has occurred to me. 0   Edinburgh Postnatal Depression Scale Total 4     Data saved with a previous flowsheet row definition   No data recorded  After visit meds:  Allergies as of 04/02/2024       Reactions   Abilify [aripiprazole] Other (See Comments)   Seizures   Metronidazole  Hives, Other (See Comments)   Inguinal area     Med Rec must be completed prior to using this [facility]***        Discharge home in stable condition Infant Feeding: {Baby feeding:23562} Infant Disposition:{CHL IP OB HOME WITH FNUYZM:76418} Discharge instruction: per After Visit Summary and Postpartum booklet. Activity: Advance as tolerated. Pelvic rest for 6 weeks.  Diet: {OB ipzu:[id number]} Future Appointments: Future Appointments  Date Time Provider Department Center  04/09/2024 10:15 AM Eveline Lynwood MATSU, MD [facility] None   Follow up Visit:  Message sent to [facility] on 04/02/24:  Please schedule this patient for a In person postpartum visit in 6 weeks with the following provider: female provider, Olam Boards if possible. Additional Postpartum F/U:Postpartum Depression checkup  High risk pregnancy complicated by: renal calculi Delivery mode:  Vaginal, Spontaneous Anticipated Birth Control:  OP IUD   04/02/2024 Olam Boards, CNM

## 2024-04-02 NOTE — Anesthesia Postprocedure Evaluation (Signed)
 Anesthesia Post Note  Patient: Alyssa Howell  Procedure(s) Performed: AN AD HOC LABOR EPIDURAL     Patient location during evaluation: Mother Baby Anesthesia Type: Epidural Level of consciousness: awake and alert Pain management: pain level controlled Vital Signs Assessment: post-procedure vital signs reviewed and stable Respiratory status: spontaneous breathing, nonlabored ventilation and respiratory function stable Cardiovascular status: stable Postop Assessment: no headache, no backache and epidural receding Anesthetic complications: no   No notable events documented.  Last Vitals:  Vitals:   04/02/24 0845 04/02/24 0900  BP: (!) 123/56 110/67  Pulse: (!) 106 94  Resp:    Temp:    SpO2:  99%    Last Pain:  Vitals:   04/02/24 0930  TempSrc:   PainSc: 0-No pain   Pain Goal:                   Alyssa Howell

## 2024-04-02 NOTE — Lactation Note (Signed)
 This note was copied from a baby's chart. Lactation Consultation Note  Patient Name: Alyssa Howell Unijb'd Date: 04/02/2024 Age:22 hours Reason for consult: Initial assessment;Late-preterm 34-36.6wks;NICU baby;Infant < 6lbs  P3. Mom was in bed w/dad and he was consoling her and she was crying. Mom had pump set up at bedside. Mom stated she had a WIC Medela pump. Mom asked for smaller flanges d/t #24 hurts. LC informed LC would send her nurse some to give her. LC didn't stay since mom was upset. Encouraged to pump every 3 hrs if she could.  Maternal Data    Feeding    LATCH Score       Type of Nipple: Everted at rest and after stimulation  Comfort (Breast/Nipple): Soft / non-tender         Lactation Tools Discussed/Used Tools: Pump;Flanges Flange Size: 18 Breast pump type: Double-Electric Breast Pump Reason for Pumping: LPI  Interventions Interventions: DEBP;LC Services brochure;LPT handout/interventions  Discharge Pump: WIC Pump (Medela)  Consult Status Consult Status: Follow-up Date: 04/03/24 Follow-up type: In-patient    Emorie Mcfate G 04/02/2024, 9:47 PM

## 2024-04-02 NOTE — Progress Notes (Signed)
 At this time, this RN approached the room door of 208 to provide direct patient care. This RN could audibly hear an argumentative dispute through the door. This RN walked into the room to find the patient tearful. The father of the baby was silent and this RN told him to kindly take a walk so the tension in the room can decrease. The patient began to sob loudly as the father of the baby stepped out of the room.

## 2024-04-02 NOTE — Anesthesia Preprocedure Evaluation (Signed)
 Anesthesia Evaluation  Patient identified by MRN, date of birth, ID band Patient awake    Reviewed: Allergy & Precautions, NPO status , Patient's Chart, lab work & pertinent test results  Airway Mallampati: II  TM Distance: >3 FB Neck ROM: Full    Dental no notable dental hx.    Pulmonary neg pulmonary ROS   Pulmonary exam normal breath sounds clear to auscultation       Cardiovascular negative cardio ROS Normal cardiovascular exam Rhythm:Regular Rate:Normal     Neuro/Psych Seizures -, Well Controlled,  PSYCHIATRIC DISORDERS  Depression       GI/Hepatic Neg liver ROS,GERD  ,,  Endo/Other  negative endocrine ROS    Renal/GU negative Renal ROS  negative genitourinary   Musculoskeletal negative musculoskeletal ROS (+)    Abdominal   Peds  Hematology  (+) Blood dyscrasia, anemia   Anesthesia Other Findings 23 y.o. [id number] at [redacted]w[redacted]d admitted for induction of labor due to renal calculi  Reproductive/Obstetrics (+) Pregnancy                              Anesthesia Physical Anesthesia Plan  ASA: 2  Anesthesia Plan: Epidural   Post-op Pain Management:    Induction:   PONV Risk Score and Plan: Treatment may vary due to age or medical condition  Airway Management Planned: Natural Airway  Additional Equipment:   Intra-op Plan:   Post-operative Plan:   Informed Consent: I have reviewed the patients History and Physical, chart, labs and discussed the procedure including the risks, benefits and alternatives for the proposed anesthesia with the patient or authorized representative who has indicated his/her understanding and acceptance.       Plan Discussed with: Anesthesiologist  Anesthesia Plan Comments: (Patient identified. Risks, benefits, options discussed with patient including but not limited to bleeding, infection, nerve damage, paralysis, failed block, incomplete pain control,  headache, blood pressure changes, nausea, vomiting, reactions to medication, itching, and post partum back pain. Confirmed with bedside nurse the patient's most recent platelet count. Confirmed with the patient that they are not taking any anticoagulation, have any bleeding history or any family history of bleeding disorders. Patient expressed understanding and wishes to proceed. All questions were answered. )        Anesthesia Quick Evaluation

## 2024-04-02 NOTE — Progress Notes (Addendum)
 Alyssa Howell is a 23 y.o. H4E8877 at [redacted]w[redacted]d admitted for induction of labor due to renal calculi, need for further treatment.  Subjective: Pt and father of baby had an argument and the RN asked him to leave and take a walk. The patient wants him to return when he has calmed down.  Pt reports she feels safe currently.  Pt is feeling abdominal cramping.    Objective: BP 114/70   Pulse 94   Temp 98 F (36.7 C) (Oral)   Resp 17   Ht 5' 5 (1.651 m)   Wt 102.1 kg   LMP 06/21/2023 (Approximate)   SpO2 100%   BMI 37.46 kg/m  I/O last 3 completed shifts: In: 1149.4 [I.V.:1149.4] Out: 3900 [Urine:3900] No intake/output data recorded.  FHT:  FHR: 135 bpm, variability: moderate,  accelerations:  Present,  decelerations:  Absent UC:   irregular, every 1-5 minutes SVE:   Dilation: 5 Effacement (%): 80 Station: -2 Exam by:: Olam Golden, CNM AROM with clear fluid. Pt tolerated well.  Labs: Lab Results  Component Value Date   WBC 11.7 (H) 04/01/2024   HGB 10.4 (L) 04/01/2024   HCT 33.0 (L) 04/01/2024   MCV 97.6 04/01/2024   PLT 272 04/01/2024    Assessment / Plan: IOL for renal calculi/stent/need for intervention from urology  Labor: After discussion of risks/benefits, pt agreed to proceed with AROM.  Discussed plan to recheck in 4 hours, start Pitocin  if not adequate labor progress. Pt agrees with plan of care.   Preeclampsia:  n/a Fetal Wellbeing:  Category I Pain Control:  Labor support without medications I/D:  GBS pos Anticipated MOD:  NSVD  Olam Boards, CNM 04/02/2024, 6:00 AM

## 2024-04-02 NOTE — Consult Note (Signed)
 [name]  Prenatal Consult       04/02/2024  7:37 AM   I was asked by Dr. Eveline to consult on this patient for possible/anticipated 35 week preterm delivery. I had the pleasure of meeting with Alyssa Howell today. She is a 23 yo [id number]. Pregnancy complicated by kidney stones and a ureteral stent requiring oxycodone  and dilaudid  x 4 months, anemia, HSV on valtrex , marijuana ise, depression, previous preterm delivery. She and her partner is/are expecting a baby girl.   I explained that the neonatal intensive care team would be present for the delivery and outlined the likely delivery room course for this baby including routine resuscitation and NRP-guided approaches to the treatment of respiratory distress. We discussed other common problems associated with prematurity including respiratory distress syndrome/CLD, apnea, feeding issues, temperature regulation, and infection risk. We briefly discussed IVH/PVL, ROP, and NEC and that these are complications associated with prematurity, but that by 30 weeks are uncommon. Mother understood these issues given her previous experience with a preterm delivery and 34 week infant that required NICU stay.    We discussed neonatal abstinence syndrome(NAS) and that her infant is at risk. We discussed that the infant will need to be monitored for a period of time after birth, 3-5 days to watch for NAS. We discussed frequent signs of withdrawal including difficulty feeding, sleeping and consoling. We also discussed common neurological signs such as jitteriness, yawning, sneezing and irritability. We reviewed both non-pharmacologic and pharmacologic treatment of NAS from opioid withdrawal. She understands that we would use morphine  as pharmacologic treatment in our NICU should the infant require treatment.   We discussed the importance of good nutrition and various methods of providing nutrition (parenteral hyperalimentation, gavage  feedings and/or oral feeding). We discussed the benefits of human milk. I encouraged breast feeding and pumping soon after birth and outlined resources that are available to support breast feeding. We reviewed that it is safe to breast feed with the medications she is prescribed.  We discussed the possibility of using donor breast milk as a bridge.  We discussed the average length of stay but I noted that the actual LOS would depend on the severity of problems encountered and response to treatments. We discussed visitation policies and the resources available while her child is in the hospital.  Thank you for involving us  in the care of this patient. A member of our team will be available should the family have additional questions.  Time for consultation: A total of 35 minutes was spent counseling the patient, reviewing her chart, and coordinating her care.    Almarie Lies, MD Attending Neonatologist

## 2024-04-03 ENCOUNTER — Other Ambulatory Visit: Payer: Self-pay | Admitting: Urology

## 2024-04-03 ENCOUNTER — Encounter (HOSPITAL_COMMUNITY): Payer: Self-pay | Admitting: Obstetrics & Gynecology

## 2024-04-03 LAB — CBC
HCT: 30.9 % — ABNORMAL LOW (ref 36.0–46.0)
Hemoglobin: 10 g/dL — ABNORMAL LOW (ref 12.0–15.0)
MCH: 32.4 pg (ref 26.0–34.0)
MCHC: 32.4 g/dL (ref 30.0–36.0)
MCV: 100 fL (ref 80.0–100.0)
Platelets: 251 K/uL (ref 150–400)
RBC: 3.09 MIL/uL — ABNORMAL LOW (ref 3.87–5.11)
RDW: 16.6 % — ABNORMAL HIGH (ref 11.5–15.5)
WBC: 11.6 K/uL — ABNORMAL HIGH (ref 4.0–10.5)
nRBC: 0 % (ref 0.0–0.2)

## 2024-04-03 LAB — GLUCOSE, CAPILLARY: Glucose-Capillary: 92 mg/dL (ref 70–99)

## 2024-04-03 MED ORDER — OXYCODONE HCL 5 MG PO TABS
5.0000 mg | ORAL_TABLET | ORAL | Status: DC | PRN
  Administered 2024-04-03 – 2024-04-04 (×4): 10 mg via ORAL
  Filled 2024-04-03 (×4): qty 2

## 2024-04-03 NOTE — Social Work (Addendum)
 Per [facility] CPS, the report was screened out.   No barriers to infant's discharge.   Nat Quiet, MSW, LCSW Clinical Social Worker  [phone] 04/03/2024  2:39 PM
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
## 2024-04-03 NOTE — Lactation Note (Signed)
 This note was copied from a baby's chart.  NICU Lactation Consultation Note  Patient Name: Alyssa Howell Date: 04/03/2024 Age:22 hours  Reason for consult: Follow-up assessment; Infant < 6lbs; Late-preterm 34-36.6wks; Other (Comment) (maternal THC use)  SUBJECTIVE Visited with family of 80 39/73 weeks old AGA NICU female; baby Alyssa Howell got admitted due to prematurity and respiratory distress. Alyssa Howell is a P3 and has some experience breastfeeding, her goal is to provide breastmilk at least for 2 months. She has been pumping consistently and getting enough colostrum to collect, praised her for all her efforts.  Provided a pumping band in size L (brown) for hands on pumping, she voiced she's familiar on how to use it as all her babies have been in the NICU; she recognized this LC as helping her with her last baby. Reviewed pumping schedule, pumping log, secretory activation, lactogenesis III, CDC and anticipatory guidelines.   OBJECTIVE Infant data: Mother's Current Feeding Choice: Breast Milk and Donor Milk  O2 Device: Room Air FiO2 (%): 21 %  Infant feeding assessment IDFS - Readiness: 3   Maternal data: [id number] Vaginal, Spontaneous Has patient been taught Hand Expression?: Yes Hand Expression Comments: colostrum noted Significant Breast History:: (+) breast changes during the pregnancy Current breast feeding challenges:: NICU admission Previous breastfeeding challenges?: Exclusive pump and bottle fed (for 2nd baby due to NICU stay) Does the patient have breastfeeding experience prior to this delivery?: Yes How long did the patient breastfeed?: 3 months for each baby Pumping frequency: initiated pumping at 12 hours post-partum Pumped volume: 5 mL (5-10 ml) Flange Size: 18 Hands-free pumping top sizes: Large Alyssa Howell) Risk factor for low/delayed milk supply:: prematurity, < 6 lbs, infant separation  Pump: Personal, DEBP, Advised to call insurance company  ([employer] from previous baby)  ASSESSMENT Infant: Feeding Status: Scheduled 9-12-3-6 Feeding method: Tube/Gavage (Bolus)  Maternal: Milk volume: Normal  INTERVENTIONS/PLAN Interventions: Interventions: Breast feeding basics reviewed; Coconut oil; DEBP; Education; CDC milk storage guidelines; CDC Guidelines for Breast Pump Cleaning; NICU Pumping Log Tools: Pump; Flanges; Coconut oil; Hands-free pumping top Pump Education: Setup, frequency, and cleaning; Milk Storage  Plan: STS around care times Breast massage, hand expression and coconut oil prior to pumping Pump both breasts on initiate mode every 3 hours for 15 minutes; ideally 8 pumping sessions/24 hours  Alyssa Howell present. All questions and concerns answered, family to contact [facility] services PRN.  Consult Status: NICU follow-up NICU Follow-up type: Maternal D/C visit   Alyssa Howell 04/03/2024, 12:20 PM

## 2024-04-04 LAB — SURGICAL PATHOLOGY

## 2024-04-04 MED ORDER — IBUPROFEN 600 MG PO TABS: 600.0000 mg | ORAL_TABLET | Freq: Four times a day (QID) | ORAL | 0 refills | Status: AC

## 2024-04-04 NOTE — Progress Notes (Signed)
 Discharge AVS instructions reviewed with patient, medication regimen, pick up pharmacy for prescription, surgical date, postpartum f/u, postpartum care, and reasons to call and/or return to [facility]U for evaluation. Patient verbalizes understanding.

## 2024-04-06 NOTE — Lactation Note (Signed)
 This note was copied from a baby's chart.  NICU Lactation Consultation Note  Patient Name: Alyssa Howell Unijb'd Date: 04/06/2024 Age:22 days  Reason for consult: Follow-up assessment; Infant < 6lbs; Late-preterm 34-36.6wks; Maternal discharge  SUBJECTIVE Visited with family of 65 66/64 weeks old AGA NICU female; parents decided to go with Sil[facility] for baby's name. Alyssa Howell reported she's been pumping consistently; her milk came in, praised her for all her efforts. She clarified she doesn't have a pump at home; she's been rooming in with baby since her discharge from [facility].   Reviewed discharge education and the importance of consistent pumping for the prevention of engorgement and to protect her supply. Offered loaner pump but she said she's interested in getting a wearable one, discussed some options. The [facility] office has already contacted her to be added to the program, she's in the process of scheduling her appt.   OBJECTIVE Infant data: Mother's Current Feeding Choice: Breast Milk and Donor Milk  O2 Device: Room Air  Infant feeding assessment IDFS - Readiness: 2   Maternal data: [id number] Vaginal, Spontaneous Pumping frequency: 6 times/24 hours Pumped volume: 60 mL  Pump: Advised to call insurance company (No pump at home, she already returned the pump from [facility] from [facility] baby; offered loaner pump on 04/06/2024)  ASSESSMENT Infant: Feeding Status: Scheduled 9-12-3-6 Feeding method: Tube/Gavage (Bolus)  Maternal: Milk volume: Normal  INTERVENTIONS/PLAN Interventions: Interventions: Breast feeding basics reviewed; Coconut oil; DEBP; Education Discharge Education: Engorgement and breast care Tools: Pump; 56F feeding tube / Syringe  Plan: STS around care times Pump both breasts on maintain mode every 3 hours for 15-30 minutes; ideally 8 pumping sessions/24 hours Verify pump issuance   No other support person at this time. All questions and concerns answered, family  to contact [facility] services PRN  Consult Status: NICU follow-up NICU Follow-up type: Weekly NICU follow up; Verify DEBP issuance   Alyssa Howell 04/06/2024, 4:43 PM

## 2024-04-07 NOTE — Progress Notes (Signed)
 Date of COVID positive in last 90 days:  PCP - Suzann Daring, MD Cardiologist -  OBGYN- Laymon Legions, MD  Chest x-ray - N/A EKG - 12/23/23 Epic Stress Test - N/A ECHO - N/A Cardiac Cath - N/A Pacemaker/ICD device last checked:N/A Spinal Cord Stimulator:N/A  Bowel Prep - N/A  Sleep Study - N/A CPAP -   Fasting Blood Sugar - N/A Checks Blood Sugar _____ times a day  Last dose of GLP1 agonist-  N/A GLP1 instructions:  Do not take after     Last dose of SGLT-2 inhibitors-  N/A SGLT-2 instructions:  Do not take after     Blood Thinner Instructions: N/A Last dose:   Time: Aspirin  Instructions:N/A Last Dose:  Activity level: Can go up a flight of stairs and perform activities of daily living without stopping and without symptoms of chest pain or shortness of breath.   Anesthesia review: gave birth 04/02/24, seizures, anemia  Patient denies shortness of breath, fever, cough and chest pain at PAT appointment  Patient verbalized understanding of instructions that were given to them at the PAT appointment. Patient was also instructed that they will need to review over the PAT instructions again at home before surgery.

## 2024-04-07 NOTE — Patient Instructions (Signed)
 SURGICAL WAITING ROOM VISITATION  Patients having surgery or a procedure may have no more than 2 support people in the waiting area - these visitors may rotate.    Children ages 1 and under will not be able to visit patients in [facility] under most circumstances.   Visitors with respiratory illnesses are discouraged from visiting and should remain at home.  If the patient needs to stay at the hospital during part of their recovery, the visitor guidelines for inpatient rooms apply. Pre-op nurse will coordinate an appropriate time for 1 support person to accompany patient in pre-op.  This support person may not rotate.    Please refer to the [facility] website for the visitor guidelines for Inpatients (after your surgery is over and you are in a regular room).    Your procedure is scheduled on: 04/14/24   Report to [facility] Main Entrance    Report to admitting at 8:00 AM   Call this number if you have problems the morning of surgery [phone]   Do not eat food or drink liquids :After Midnight.          If you have questions, please contact your surgeons office.   FOLLOW BOWEL PREP AND ANY ADDITIONAL PRE OP INSTRUCTIONS YOU RECEIVED FROM YOUR SURGEON'S OFFICE!!!     Oral Hygiene is also important to reduce your risk of infection.                                    Remember - BRUSH YOUR TEETH THE MORNING OF SURGERY WITH YOUR REGULAR TOOTHPASTE  DENTURES WILL BE REMOVED PRIOR TO SURGERY PLEASE DO NOT APPLY Poly grip OR ADHESIVES!!!   Stop all vitamins and herbal supplements 7 days before surgery.   Take these medicines the morning of surgery with A SIP OF WATER: Tylenol , Tamuslosin                               You may not have any metal on your body including hair pins, jewelry, and body piercing             Do not wear make-up, lotions, powders, perfumes, or deodorant  Do not wear nail polish including gel and S&S, artificial/acrylic nails, or any  other type of covering on natural nails including finger and toenails. If you have artificial nails, gel coating, etc. that needs to be removed by a nail salon please have this removed prior to surgery or surgery may need to be canceled/ delayed if the surgeon/ anesthesia feels like they are unable to be safely monitored.   Do not shave  48 hours prior to surgery.    Do not bring valuables to the hospital. [facility] IS NOT             RESPONSIBLE   FOR VALUABLES.   Contacts, glasses, dentures or bridgework may not be worn into surgery.  DO NOT BRING YOUR HOME MEDICATIONS TO THE HOSPITAL. PHARMACY WILL DISPENSE MEDICATIONS LISTED ON YOUR MEDICATION LIST TO YOU DURING YOUR ADMISSION IN THE HOSPITAL!    Patients discharged on the day of surgery will not be allowed to drive home.  Someone NEEDS to stay with you for the first 24 hours after anesthesia.              Please read over the following  fact sheets you were given: IF YOU HAVE QUESTIONS ABOUT YOUR PRE-OP INSTRUCTIONS PLEASE CALL [phone]-Deegan Valentino.   If you received a COVID test during your pre-op visit  it is requested that you wear a mask when out in public, stay away from anyone that may not be feeling well and notify your surgeon if you develop symptoms. If you test positive for Covid or have been in contact with anyone that has tested positive in the last 10 days please notify you surgeon.    [place] - Preparing for Surgery Before surgery, you can play an important role.  Because skin is not sterile, your skin needs to be as free of germs as possible.  You can reduce the number of germs on your skin by washing with CHG (chlorahexidine gluconate) soap before surgery.  CHG is an antiseptic cleaner which kills germs and bonds with the skin to continue killing germs even after washing. Please DO NOT use if you have an allergy to CHG or antibacterial soaps.  If your skin becomes reddened/irritated stop using the CHG and inform your  nurse when you arrive at Short Stay. Do not shave (including legs and underarms) for at least 48 hours prior to the first CHG shower.  You may shave your face/neck.  Please follow these instructions carefully:  1.  Shower with CHG Soap the night before surgery ONLY (DO NOT USE THE SOAP THE MORNING OF SURGERY).  2.  If you choose to wash your hair, wash your hair first as usual with your normal  shampoo.  3.  After you shampoo, rinse your hair and body thoroughly to remove the shampoo.                             4.  Use CHG as you would any other liquid soap.  You can apply chg directly to the skin and wash.  Gently with a scrungie or clean washcloth.  5.  Apply the CHG Soap to your body ONLY FROM THE NECK DOWN.   Do   not use on face/ open                           Wound or open sores. Avoid contact with eyes, ears mouth and   genitals (private parts).                       Wash face,  Genitals (private parts) with your normal soap.             6.  Wash thoroughly, paying special attention to the area where your    surgery  will be performed.  7.  Thoroughly rinse your body with warm water from the neck down.  8.  DO NOT shower/wash with your normal soap after using and rinsing off the CHG Soap.                9.  Pat yourself dry with a clean towel.            10.  Wear clean pajamas.            11.  Place clean sheets on your bed the night of your first shower and do not  sleep with pets. Day of Surgery : Do not apply any CHG, lotions/deodorants the morning of surgery.  Please wear clean clothes to the hospital/surgery center.  FAILURE TO FOLLOW THESE INSTRUCTIONS MAY RESULT IN THE CANCELLATION OF YOUR SURGERY  PATIENT SIGNATURE_________________________________  NURSE SIGNATURE__________________________________  ________________________________________________________________________

## 2024-04-08 ENCOUNTER — Other Ambulatory Visit: Payer: Self-pay

## 2024-04-08 ENCOUNTER — Encounter (HOSPITAL_COMMUNITY)
Admission: RE | Admit: 2024-04-08 | Discharge: 2024-04-08 | Disposition: A | Source: Ambulatory Visit | Attending: Urology

## 2024-04-08 ENCOUNTER — Encounter (HOSPITAL_COMMUNITY): Payer: Self-pay

## 2024-04-08 DIAGNOSIS — Z01818 Encounter for other preprocedural examination: Secondary | ICD-10-CM | POA: Insufficient documentation

## 2024-04-09 ENCOUNTER — Other Ambulatory Visit: Payer: Self-pay

## 2024-04-09 ENCOUNTER — Encounter (HOSPITAL_COMMUNITY): Payer: Self-pay

## 2024-04-09 ENCOUNTER — Encounter: Admitting: Obstetrics & Gynecology

## 2024-04-09 ENCOUNTER — Emergency Department (HOSPITAL_COMMUNITY)
Admission: EM | Admit: 2024-04-09 | Discharge: 2024-04-10 | Disposition: A | Discharge status: Home / Self Care | Attending: Emergency Medicine | Admitting: Emergency Medicine

## 2024-04-09 ENCOUNTER — Inpatient Hospital Stay (HOSPITAL_COMMUNITY)
Admission: AD | Admit: 2024-04-09 | Discharge: 2024-04-09 | Disposition: A | Discharge status: Home / Self Care | Source: Home / Self Care | Attending: Obstetrics and Gynecology | Admitting: Obstetrics and Gynecology

## 2024-04-09 ENCOUNTER — Telehealth (HOSPITAL_COMMUNITY): Payer: Self-pay | Admitting: *Deleted

## 2024-04-09 DIAGNOSIS — N200 Calculus of kidney: Secondary | ICD-10-CM | POA: Diagnosis not present

## 2024-04-09 DIAGNOSIS — R3989 Other symptoms and signs involving the genitourinary system: Secondary | ICD-10-CM | POA: Diagnosis not present

## 2024-04-09 DIAGNOSIS — N132 Hydronephrosis with renal and ureteral calculous obstruction: Secondary | ICD-10-CM | POA: Insufficient documentation

## 2024-04-09 DIAGNOSIS — Z960 Presence of urogenital implants: Secondary | ICD-10-CM

## 2024-04-09 DIAGNOSIS — D72829 Elevated white blood cell count, unspecified: Secondary | ICD-10-CM | POA: Diagnosis not present

## 2024-04-09 DIAGNOSIS — R109 Unspecified abdominal pain: Secondary | ICD-10-CM | POA: Diagnosis present

## 2024-04-09 DIAGNOSIS — R309 Painful micturition, unspecified: Secondary | ICD-10-CM | POA: Diagnosis present

## 2024-04-09 DIAGNOSIS — O9089 Other complications of the puerperium, not elsewhere classified: Secondary | ICD-10-CM

## 2024-04-09 DIAGNOSIS — O99893 Other specified diseases and conditions complicating puerperium: Secondary | ICD-10-CM | POA: Diagnosis not present

## 2024-04-09 LAB — URINALYSIS, ROUTINE W REFLEX MICROSCOPIC
Bacteria, UA: NONE SEEN
Bilirubin Urine: NEGATIVE
Glucose, UA: NEGATIVE mg/dL
Ketones, ur: NEGATIVE mg/dL
Nitrite: NEGATIVE
Protein, ur: 100 mg/dL — AB
RBC / HPF: >50 RBC/hpf (ref 0–5)
Specific Gravity, Urine: 1.02 (ref 1.005–1.030)
pH: 6 (ref 5.0–8.0)

## 2024-04-09 LAB — CBC
HCT: 39.1 % (ref 36.0–46.0)
Hemoglobin: 12.3 g/dL (ref 12.0–15.0)
MCH: 31.2 pg (ref 26.0–34.0)
MCHC: 31.5 g/dL (ref 30.0–36.0)
MCV: 99.2 fL (ref 80.0–100.0)
Platelets: 390 K/uL (ref 150–400)
RBC: 3.94 MIL/uL (ref 3.87–5.11)
RDW: 16.4 % — ABNORMAL HIGH (ref 11.5–15.5)
WBC: 12.2 K/uL — ABNORMAL HIGH (ref 4.0–10.5)
nRBC: 0 % (ref 0.0–0.2)

## 2024-04-09 LAB — BASIC METABOLIC PANEL WITH GFR
Anion gap: 11 (ref 5–15)
BUN: 19 mg/dL (ref 6–20)
CO2: 24 mmol/L (ref 22–32)
Calcium: 9.4 mg/dL (ref 8.9–10.3)
Chloride: 106 mmol/L (ref 98–111)
Creatinine, Ser: 0.71 mg/dL (ref 0.44–1.00)
GFR, Estimated: >60 mL/min (ref >60)
Glucose, Bld: 89 mg/dL (ref 70–99)
Potassium: 4.3 mmol/L (ref 3.5–5.1)
Sodium: 140 mmol/L (ref 135–145)

## 2024-04-09 LAB — URINALYSIS, MICROSCOPIC (REFLEX)
RBC / HPF: >50 RBC/hpf (ref 0–5)
WBC, UA: >50 WBC/hpf (ref 0–5)

## 2024-04-09 MED ORDER — PHENAZOPYRIDINE HCL 200 MG PO TABS: 200.0000 mg | ORAL_TABLET | Freq: Three times a day (TID) | ORAL | 0 refills | Status: AC

## 2024-04-09 MED ORDER — OXYCODONE-ACETAMINOPHEN 5-325 MG PO TABS: 1.0000 | ORAL_TABLET | Freq: Four times a day (QID) | ORAL | 0 refills | Status: AC | PRN

## 2024-04-09 MED ORDER — OXYCODONE HCL 5 MG PO TABS
5.0000 mg | ORAL_TABLET | Freq: Once | ORAL | Status: AC
  Administered 2024-04-09: 21:00:00 5 mg via ORAL
  Filled 2024-04-09: qty 1

## 2024-04-09 MED ORDER — BENZOCAINE-MENTHOL 20-0.5 % EX AERO
1.0000 | INHALATION_SPRAY | Freq: Four times a day (QID) | CUTANEOUS | Status: DC | PRN
  Administered 2024-04-09: 21:00:00 1 via TOPICAL
  Filled 2024-04-09: qty 56

## 2024-04-09 NOTE — MAU Provider Note (Addendum)
 History     CSN: [id number]  Arrival date and time: 04/09/24 1608   Event Date/Time   First Provider Initiated Contact with Patient 04/09/24 2032      No chief complaint on file.  Alyssa Howell , a  23 y.o. [id number] at 7 days PP presents to MAU with complaints of pain with urination. Patient reports 10/10 pain only with urination that started 3 days ago. Pain so bad  Im holding my pee. She reports peeing blood and blood clots. Reports taking tylenol  and ibuprofen  without relief. She also reports left sided pain where the stent is but this is not a new problem. Denies difficulty with urination but states feels like urethra is being sliced. She has no OB complaints. Continues to take Flomax    Patient has a stent in place and has Lithotripsy scheduled for Monday.            OB History     Gravida  5   Para  3   Term  1   Preterm  2   AB  2   Living  3      SAB  1   IAB  1   Ectopic  0   Multiple  0   Live Births  3           Past Medical History:  Diagnosis Date   ADHD    Anemia    Chlamydia 2022   Depression    GERD (gastroesophageal reflux disease) 10/09/2019   Headache    migraines   HSV-2 infection    per pt/ seen in ED [facility]   Kidney stones    Seizures (HCC)    x 1 in 5th grade after taking ability. No other episodes. 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   Vaginal pain at introitus  Referred to Pelvic Floor PT      Past Surgical History:  Procedure Laterality Date   CYSTOSCOPY W/ URETERAL STENT PLACEMENT Left 03/27/2024   Procedure: CYSTOSCOPY, WITH RETROGRADE PYELOGRAM AND URETERAL STENT INSERTION;  Surgeon: Selma Donnice SAUNDERS, MD;  Location: [facility] OR;  Service: Urology;  Laterality: Left;   CYSTOSCOPY/URETEROSCOPY/HOLMIUM LASER/STENT PLACEMENT Right 08/02/2020   Procedure: CYSTOSCOPY/URETEROSCOPY/HOLMIUM LASER/STENT PLACEMENT;  Surgeon: Penne Knee, MD;  Location: [facility] ORS;   Service: Urology;  Laterality: Right;   DILATION AND CURETTAGE OF UTERUS N/A 08/12/2022   Procedure: DILATATION AND CURETTAGE, ULTRASOUND GUIDED;  Surgeon: Zina Jerilynn LABOR, MD;  Location: [facility] OR;  Service: Gynecology;  Laterality: N/A;   FOOT SURGERY Bilateral    metal screws in toes,2020   TONSILLECTOMY      Family History  Problem Relation Age of Onset   Hepatitis C Mother    Valvular heart disease Mother    Other Father        no contact in over 75yrs   Cancer Maternal Grandmother    Breast cancer Maternal Grandmother    Diabetes Other    Heart attack Other    Asthma Neg Hx    Heart disease Neg Hx    Hypertension Neg Hx     Social History[1]  Allergies: Allergies[2]  Medications Prior to Admission  Medication Sig Dispense Refill Last Dose/Taking   acetaminophen  (TYLENOL ) 325 MG tablet Take 650 mg by mouth every 4 (four) hours as needed for mild pain (pain score 1-3) or moderate pain (pain score 4-6).   04/09/2024 at  3:00 PM   Prenatal Vit-Fe  Phos-FA-Omega (VITAFOL  GUMMIES) 3.33-0.333-34.8 MG CHEW Chew 3 tablets by mouth daily. 90 tablet 11 04/08/2024   tamsulosin  (FLOMAX ) 0.4 MG CAPS capsule Take 1 capsule (0.4 mg total) by mouth daily. 30 capsule 2 04/09/2024   valACYclovir  (VALTREX ) 500 MG tablet Take 1 tablet (500 mg total) by mouth 2 (two) times daily. 60 tablet 4 04/09/2024   acetaminophen -caffeine  (EXCEDRIN TENSION HEADACHE) 500-65 MG TABS per tablet Take 2 tablets by mouth 4 (four) times daily as needed (For headaches). (Patient not taking: Reported on 04/07/2024) 60 tablet 0    Boric Acid 600 MG SUPP Place 600 mg vaginally 2 (two) times daily. 60 suppository 0    cyclobenzaprine  (FLEXERIL ) 10 MG tablet Take 0.5-1 tablets (5-10 mg total) by mouth 3 (three) times daily as needed for muscle spasms. (Patient not taking: Reported on 04/07/2024) 15 tablet 0    famotidine  (PEPCID ) 40 MG tablet Take 1 tablet (40 mg total) by mouth daily. (Patient not taking: Reported on  04/07/2024) 90 tablet 3    ferrous sulfate  325 (65 FE) MG tablet Take 1 tablet (325 mg total) by mouth daily. (Patient not taking: Reported on 04/07/2024) 30 tablet 0    HYDROmorphone  (DILAUDID ) 2 MG tablet Take 1 tablet (2 mg total) by mouth every 4 (four) hours as needed for severe pain (pain score 7-10). (Patient not taking: Reported on 04/07/2024) 30 tablet 0    ibuprofen  (ADVIL ) 600 MG tablet Take 1 tablet (600 mg total) by mouth every 6 (six) hours. (Patient not taking: Reported on 04/07/2024) 30 tablet 0    ondansetron  (ZOFRAN -ODT) 8 MG disintegrating tablet Take 1 tablet (8 mg total) by mouth every 8 (eight) hours as needed for nausea or vomiting. 20 tablet 0    promethazine  (PHENERGAN ) 25 MG tablet Take 1 tablet (25 mg total) by mouth every 6 (six) hours as needed for nausea or vomiting. 30 tablet 0    terconazole  (TERAZOL 7 ) 0.4 % vaginal cream Place 1 applicator vaginally at bedtime. Use for seven days (Patient not taking: Reported on 04/07/2024) 45 g 0     Review of Systems  Constitutional:  Negative for chills, fatigue and fever.  Eyes:  Negative for pain and visual disturbance.  Respiratory:  Negative for apnea, shortness of breath and wheezing.   Cardiovascular:  Negative for chest pain and palpitations.  Gastrointestinal:  Negative for abdominal pain, constipation, diarrhea, nausea and vomiting.  Genitourinary:  Positive for flank pain, hematuria and pelvic pain. Negative for difficulty urinating, dysuria, vaginal bleeding, vaginal discharge and vaginal pain.  Musculoskeletal:  Negative for back pain.  Neurological:  Negative for seizures, weakness and headaches.  Psychiatric/Behavioral:  Negative for suicidal ideas.    Physical Exam   Blood pressure 124/78, pulse 97, temperature 98.6 F (37 C), temperature source Oral, resp. rate 19, height 5' 5 (1.651 m), weight 98.8 kg, SpO2 100%, currently breastfeeding.  Physical Exam Vitals (Patinet very tearful) and nursing note  reviewed.  Constitutional:      General: She is not in acute distress.    Appearance: Normal appearance.  HENT:     Head: Normocephalic.  Cardiovascular:     Rate and Rhythm: Normal rate and regular rhythm.  Pulmonary:     Effort: Pulmonary effort is normal.  Abdominal:     Palpations: Abdomen is soft.  Musculoskeletal:     Cervical back: Normal range of motion.  Skin:    General: Skin is warm and dry.  Neurological:     Mental Status:  She is alert and oriented to person, place, and time.  Psychiatric:        Mood and Affect: Mood normal.     MAU Course  Procedures Orders Placed This Encounter  Procedures   Culture, OB Urine   Urinalysis, Routine w reflex microscopic -Urine, Clean Catch   Urinalysis, Microscopic (reflex)   Discharge patient Discharge disposition: 01-Home or Self Care; Discharge patient date: 04/09/2024   Discharge patient Discharge disposition: 01-Home or Self Care; Discharge patient date: 04/09/2024   Meds ordered this encounter  Medications   oxyCODONE  (Oxy IR/ROXICODONE ) immediate release tablet 5 mg    Refill:  0   benzocaine -Menthol  (DERMOPLAST) 20-0.5 % topical spray 1 Application   oxyCODONE -acetaminophen  (PERCOCET/ROXICET) 5-325 MG tablet    Sig: Take 1 tablet by mouth every 6 (six) hours as needed for severe pain (pain score 7-10).    Dispense:  20 tablet    Refill:  0    Supervising Provider:   PRATT, TANYA S [[id number]]   phenazopyridine  (PYRIDIUM ) 200 MG tablet    Sig: Take 1 tablet (200 mg total) by mouth 3 (three) times daily.    Dispense:  6 tablet    Refill:  0    Supervising Provider:   PRATT, TANYA S [[id number]]   benzocaine -Menthol  (DERMOPLAST) 20-0.5 % AERO    Sig: Apply 1 Application topically 4 (four) times daily as needed for irritation.    Dispense:  56 g    Refill:  1    Supervising Provider:   PRATT, TANYA S [[id number]]   benzocaine -Menthol  (DERMOPLAST) 20-0.5 % AERO    Sig: Apply 1 Application topically 4 (four) times daily as needed  for irritation.    Dispense:  56 g    Refill:  0    Supervising Provider:   PRATT, TANYA S [[id number]]    MDM - Pain consistent with the passage of kidney stone.  - UA with blood and unable to be analyzed. Reflexed to culture to rule out UTI.  - Consulted Dr. DELENA and MD agrees with pain management recommendations with follow up to Uro in AM.  - Oxycodone  given in MAU.  - Discussed Pain management options with patient and she became very tearful and reports that she just wants this to be over.  - Patient desires to go to ED.   Assessment and Plan   1. Urethral pain   2. Postpartum hemorrhage, unspecified type   3. Status post placement of ureteral stent    - Lengthy discussion on expectations for kidney stones and pain management options.  - Rx for Pyridium  and Oxycodone  sent to outpatient pharmacy to get her to surgery on Monday.  - Also offered the use of hurricane spray with urination for comfort. Discussed increasing fluid intake with water and lemonade.  - Reviewed worsening postpartum and return precautions.  - Patient discharged from MAU in stable condition and escorted to Main ED.   Alyssa CHRISTELLA Cedar, MSN CNM  04/09/2024, 8:32 PM      [1]  Social History Tobacco Use   Smoking status: Never   Smokeless tobacco: Never  Vaping Use   Vaping status: Never Used  Substance Use Topics   Alcohol use: Not Currently   Drug use: Not Currently    Types: Marijuana    Comment: last used the end of September 2022 as of 02/07/2021  [2]  Allergies Allergen Reactions   Abilify [Aripiprazole] Other (See Comments)    Seizures   Metronidazole  Hives and  Other (See Comments)    Inguinal area

## 2024-04-09 NOTE — Telephone Encounter (Signed)
 Attempted hospital discharge follow-up phone call. Message received stating, Voicemail has not been set up yet. RN unable to leave a message for patient. Allean IVAR Carton, RN, 04/09/24, [phone]

## 2024-04-09 NOTE — MAU Note (Signed)
 Alyssa Howell is a 23 y.o. at Unknown here in MAU reporting: she's having pain with urination. Reports urine has strong foul odor. States has a Hx of kidney stones and currently has a stint in left kidney.  Reports has pain in left lower back and pain worsens during urination.  States has been taking tylenol  for pain, last took @ 1500. S/P NVD 04/02/2024 LMP: NA Onset of complaint: 4 days Pain score: 10 Vitals:   04/09/24 1718  BP: 124/78  Pulse: 97  Resp: 19  Temp: 98.6 F (37 C)  SpO2: 100%     FHT: NA  Lab orders placed from triage: UA

## 2024-04-09 NOTE — ED Triage Notes (Signed)
 Pt sent from MAU, pt is 1 week pp, had a stent placed in her kidney and is now having abd pain and hematuria.

## 2024-04-10 LAB — CULTURE, OB URINE: Culture: 50000 — AB

## 2024-04-10 MED ORDER — HYDROMORPHONE HCL 1 MG/ML IJ SOLN
1.0000 mg | Freq: Once | INTRAMUSCULAR | Status: AC
  Administered 2024-04-10: 03:00:00 1 mg via INTRAVENOUS
  Filled 2024-04-10: qty 1

## 2024-04-10 MED ORDER — OXYCODONE-ACETAMINOPHEN 5-325 MG PO TABS
1.0000 | ORAL_TABLET | Freq: Once | ORAL | Status: AC
  Administered 2024-04-10: 05:00:00 1 via ORAL
  Filled 2024-04-10: qty 1

## 2024-04-10 NOTE — ED Provider Notes (Signed)
 [place] EMERGENCY DEPARTMENT AT [facility] Provider Note   CSN: [id number] Arrival date & time: 04/09/24  2215     Patient presents with: Abdominal Pain and Hematuria   Alyssa Howell is a 23 y.o. female.  Presents to the ED from MAU with dysuria for the past 5 days.  She has a history of kidney stones and currently has a stent in her left kidney this month.  Patient reports severe pain with urination as well as blood in urine.  She also has left sided lower back pain that radiates to the left lower abdominal pain. she was given Percocet at MAU and was recommended to to come to the ED for further evaluation. Patient was admitted to the hospital on March 30, 2024 for induction of labor and gave birth on April 02, 2024.  She is scheduled for Lithotripsy scheduled for 04/14/2024. Currently breastfeeding. Denies fevers, shortness of breath, chest pain, diarrhea, nausea, vomiting, vaginal discharge, or any other symptoms at this time.   She had an ultrasound renal on 03/30/2024 that showed mild left hydronephrosis secondary to a 9 mm calcified stone in the expected region of the left uterovesical junction and a non-obstructing 4 mm calcified stone within the left kidney.     Abdominal Pain Associated symptoms: dysuria and hematuria   Hematuria Associated symptoms include abdominal pain.       Prior to Admission medications  Medication Sig Start Date End Date Taking? Authorizing Provider  acetaminophen  (TYLENOL ) 325 MG tablet Take 650 mg by mouth every 4 (four) hours as needed for mild pain (pain score 1-3) or moderate pain (pain score 4-6).    [provider]  acetaminophen -caffeine  (EXCEDRIN TENSION HEADACHE) 500-65 MG TABS per tablet Take 2 tablets by mouth 4 (four) times daily as needed (For headaches). Patient not taking: Reported on 04/07/2024 01/09/24   Littie Olam LABOR, NP  benzocaine -Menthol  (DERMOPLAST) 20-0.5 % AERO Apply 1 Application topically 4 (four)  times daily as needed for irritation. 04/09/24   Emilio Delilah HERO, CNM  benzocaine -Menthol  (DERMOPLAST) 20-0.5 % AERO Apply 1 Application topically 4 (four) times daily as needed for irritation. 04/09/24   Emilio Delilah HERO, CNM  Boric Acid 600 MG SUPP Place 600 mg vaginally 2 (two) times daily. 03/27/24 04/26/24  Trudy Leeroy NOVAK, MD  cyclobenzaprine  (FLEXERIL ) 10 MG tablet Take 0.5-1 tablets (5-10 mg total) by mouth 3 (three) times daily as needed for muscle spasms. Patient not taking: Reported on 04/07/2024 02/12/24   Nicholaus Burnard HERO, MD  famotidine  (PEPCID ) 40 MG tablet Take 1 tablet (40 mg total) by mouth daily. Patient not taking: Reported on 04/07/2024 10/29/23   Cresenzo, John V, MD  ferrous sulfate  325 (65 FE) MG tablet Take 1 tablet (325 mg total) by mouth daily. Patient not taking: Reported on 04/07/2024 01/09/24   Littie Olam LABOR, NP  HYDROmorphone  (DILAUDID ) 2 MG tablet Take 1 tablet (2 mg total) by mouth every 4 (four) hours as needed for severe pain (pain score 7-10). Patient not taking: Reported on 04/07/2024 03/21/24   Vannie Cornell SAUNDERS, CNM  ibuprofen  (ADVIL ) 600 MG tablet Take 1 tablet (600 mg total) by mouth every 6 (six) hours. Patient not taking: Reported on 04/07/2024 04/04/24   Fredirick Glenys RAMAN, MD  ondansetron  (ZOFRAN -ODT) 8 MG disintegrating tablet Take 1 tablet (8 mg total) by mouth every 8 (eight) hours as needed for nausea or vomiting. 02/25/24   Wallace Joesph LABOR, PA  oxyCODONE -acetaminophen  (PERCOCET/ROXICET) 5-325 MG tablet Take 1  tablet by mouth every 6 (six) hours as needed for severe pain (pain score 7-10). 04/09/24   Emilio Delilah HERO, CNM  phenazopyridine  (PYRIDIUM ) 200 MG tablet Take 1 tablet (200 mg total) by mouth 3 (three) times daily. 04/09/24   Emilio Delilah HERO, CNM  Prenatal Vit-Fe Phos-FA-Omega (VITAFOL  GUMMIES) 3.33-0.333-34.8 MG CHEW Chew 3 tablets by mouth daily. 09/18/23   Constant, Peggy, MD  promethazine  (PHENERGAN ) 25 MG tablet Take 1 tablet  (25 mg total) by mouth every 6 (six) hours as needed for nausea or vomiting. 03/20/24   Synthia Raisin, CNM  tamsulosin  (FLOMAX ) 0.4 MG CAPS capsule Take 1 capsule (0.4 mg total) by mouth daily. 03/13/24   Wallace Joesph LABOR, PA  terconazole  (TERAZOL 7 ) 0.4 % vaginal cream Place 1 applicator vaginally at bedtime. Use for seven days Patient not taking: Reported on 04/07/2024 01/17/24   Milly Planas A, CNM  valACYclovir  (VALTREX ) 500 MG tablet Take 1 tablet (500 mg total) by mouth 2 (two) times daily. 03/26/24   Trudy Leeroy NOVAK, MD    Allergies: Abilify [aripiprazole] and Metronidazole     Review of Systems  Gastrointestinal:  Positive for abdominal pain.  Genitourinary:  Positive for dysuria, flank pain and hematuria.    Updated Vital Signs BP 124/82   Pulse 94   Temp 97.6 F (36.4 C) (Oral)   Resp 16   Ht 5' 5 (1.651 m)   Wt 98.4 kg   SpO2 99%   BMI 36.11 kg/m   Physical Exam Vitals and nursing note reviewed.  Constitutional:      General: She is not in acute distress.    Appearance: She is well-developed. She is not ill-appearing.  HENT:     Head: Normocephalic and atraumatic.  Eyes:     Conjunctiva/sclera: Conjunctivae normal.  Cardiovascular:     Rate and Rhythm: Normal rate and regular rhythm.     Heart sounds: No murmur heard. Pulmonary:     Effort: Pulmonary effort is normal. No respiratory distress.     Breath sounds: Normal breath sounds.  Abdominal:     Palpations: Abdomen is soft.     Tenderness: There is generalized abdominal tenderness. There is left CVA tenderness. There is no right CVA tenderness.  Musculoskeletal:        General: No swelling.     Cervical back: Neck supple.  Skin:    General: Skin is warm and dry.     Capillary Refill: Capillary refill takes less than 2 seconds.  Neurological:     Mental Status: She is alert.  Psychiatric:        Mood and Affect: Mood normal.     (all labs ordered are listed, but only abnormal results are  displayed) Labs Reviewed  CBC - Abnormal; Notable for the following components:      Result Value   WBC 12.2 (*)    RDW 16.4 (*)    All other components within normal limits  URINALYSIS, ROUTINE W REFLEX MICROSCOPIC - Abnormal; Notable for the following components:   APPearance CLOUDY (*)    Hgb urine dipstick LARGE (*)    Protein, ur 100 (*)    Leukocytes,Ua TRACE (*)    All other components within normal limits  BASIC METABOLIC PANEL WITH GFR    EKG: None  Radiology: No results found.   Procedures   Medications Ordered in the ED  HYDROmorphone  (DILAUDID ) injection 1 mg (1 mg Intravenous Given 04/10/24 0307)  oxyCODONE -acetaminophen  (PERCOCET/ROXICET) 5-325 MG per tablet 1  tablet (1 tablet Oral Given 04/10/24 0456)                                    Medical Decision Making Amount and/or Complexity of Data Reviewed Labs: ordered.    This patient presents to the ED for concern of dysuria hematuria, this involves an extensive number of treatment options, and is a complaint that carries with it a high risk of complications and morbidity.  The differential diagnosis includes pyelonephritis, cystitis, hydronephrosis, stent obstruction, urosepsis, renal vein thrombosis, nephrolithiasis.    Co morbidities / Chronic conditions that complicate the patient evaluation  History of kidney stones, stent placed in left kidney    Additional history obtained:  Additional history obtained from EMR External records from outside source obtained and reviewed including an ultrasound renal on 03/30/2024 that showed mild left hydronephrosis secondary to a 9 mm calcified stone in the expected region of the left uterovesical junction and a nonobstructing 4 mm calcified stone within the left kidney.  Also reviewed her CT renal stone study from 03/27/2024.  Reviewed admission for uncomplicated labor from 03/30/2024   Lab Tests:  I Ordered, and personally interpreted labs.  The pertinent  results include: Large Hgb due to recent delivery but otherwise negative    Problem List / ED Course / Critical interventions / Medication management  Patient presents with dysuria and hematuria for the past few days. She is scheduled for Lithotripsy scheduled for 04/14/2024.  Provided pain medication in the ED and patient improved.  No need for imaging as the patient obtained ultrasound and renal stone study recently.  Patient is not in distress and vital signs are stable. reviewed the negative results with the patient.  Recommended her to take the pain medication as prescribed by her doctor and undergo the Lithotripsy as scheduled next week.  I recommended the patient to closely follow-up with urology and reassured her that her workup is reassuring at this time. patient is in agreement with plan and is stable for discharge.  Patient did request pain medications prescription however I informed her that I am unable to do this and take pain medication as prescribed her her doctor.  I ordered medication including Dilaudid  and Percocet in the ED Reevaluation of the patient after these medicines showed that the patient improved after the medication in the ED      Final diagnoses:  Kidney stone    ED Discharge Orders     None          Braxton Dubois, PA-C 04/10/24 0531    Trine Raynell Moder, MD 04/10/24 [phone]

## 2024-04-10 NOTE — Discharge Instructions (Addendum)
 Close follow-up with urology and take ibuprofen  as needed.

## 2024-04-10 NOTE — Lactation Note (Signed)
 This note was copied from a baby's chart. Lactation Consultation Note  Patient Name: Alyssa Howell Unijb'd Date: 04/10/2024 Age:22 days   NICU RN Tiffany H called out for lactation because [name] got re-admitted to the ED yesterday due to kidney stones and she was given Dilaudid ; an L3 according to Medications and Mother's milk by Debby Mayor. Tahirih wanted to know if it's still OK to provide breastmilk for [name]; she only got one dose of Dilaudid  while on the ED. Let her know that she doesn't need to pump and dump while on this medication and to F/U with [facility] services if any further questions or concerns arise.   Enrico Eaddy S Yoshino Broccoli 04/10/2024, 5:00 PM

## 2024-04-11 ENCOUNTER — Inpatient Hospital Stay (HOSPITAL_COMMUNITY)
Admission: AD | Admit: 2024-04-11 | Discharge: 2024-04-11 | Disposition: A | Discharge status: Home / Self Care | Attending: Obstetrics and Gynecology | Admitting: Obstetrics and Gynecology

## 2024-04-11 ENCOUNTER — Other Ambulatory Visit: Payer: Self-pay

## 2024-04-11 DIAGNOSIS — R8271 Bacteriuria: Secondary | ICD-10-CM

## 2024-04-11 DIAGNOSIS — O9089 Other complications of the puerperium, not elsewhere classified: Secondary | ICD-10-CM | POA: Diagnosis not present

## 2024-04-11 DIAGNOSIS — N200 Calculus of kidney: Secondary | ICD-10-CM

## 2024-04-11 DIAGNOSIS — N898 Other specified noninflammatory disorders of vagina: Secondary | ICD-10-CM | POA: Diagnosis present

## 2024-04-11 LAB — URINALYSIS, ROUTINE W REFLEX MICROSCOPIC
Glucose, UA: 100 mg/dL — AB
Ketones, ur: NEGATIVE mg/dL
Nitrite: POSITIVE — AB
Protein, ur: >300 mg/dL — AB
Specific Gravity, Urine: >1.03 — ABNORMAL HIGH (ref 1.005–1.030)
pH: 5.5 (ref 5.0–8.0)

## 2024-04-11 LAB — COMPREHENSIVE METABOLIC PANEL WITH GFR
ALT: 14 U/L (ref 0–44)
AST: 17 U/L (ref 15–41)
Albumin: 3.7 g/dL (ref 3.5–5.0)
Alkaline Phosphatase: 123 U/L (ref 38–126)
Anion gap: 12 (ref 5–15)
BUN: 18 mg/dL (ref 6–20)
CO2: 21 mmol/L — ABNORMAL LOW (ref 22–32)
Calcium: 9.3 mg/dL (ref 8.9–10.3)
Chloride: 108 mmol/L (ref 98–111)
Creatinine, Ser: 0.94 mg/dL (ref 0.44–1.00)
GFR, Estimated: >60 mL/min
Glucose, Bld: 107 mg/dL — ABNORMAL HIGH (ref 70–99)
Potassium: 3.7 mmol/L (ref 3.5–5.1)
Sodium: 141 mmol/L (ref 135–145)
Total Bilirubin: <0.2 mg/dL (ref 0.0–1.2)
Total Protein: 6.8 g/dL (ref 6.5–8.1)

## 2024-04-11 LAB — CBC WITH DIFFERENTIAL/PLATELET
Abs Immature Granulocytes: 0.07 K/uL (ref 0.00–0.07)
Basophils Absolute: 0.1 K/uL (ref 0.0–0.1)
Basophils Relative: 1 %
Eosinophils Absolute: 0.6 K/uL — ABNORMAL HIGH (ref 0.0–0.5)
Eosinophils Relative: 6 %
HCT: 38 % (ref 36.0–46.0)
Hemoglobin: 12 g/dL (ref 12.0–15.0)
Immature Granulocytes: 1 %
Lymphocytes Relative: 31 %
Lymphs Abs: 2.7 K/uL (ref 0.7–4.0)
MCH: 31.1 pg (ref 26.0–34.0)
MCHC: 31.6 g/dL (ref 30.0–36.0)
MCV: 98.4 fL (ref 80.0–100.0)
Monocytes Absolute: 0.6 K/uL (ref 0.1–1.0)
Monocytes Relative: 7 %
Neutro Abs: 4.8 K/uL (ref 1.7–7.7)
Neutrophils Relative %: 54 %
Platelets: 405 K/uL — ABNORMAL HIGH (ref 150–400)
RBC: 3.86 MIL/uL — ABNORMAL LOW (ref 3.87–5.11)
RDW: 15.8 % — ABNORMAL HIGH (ref 11.5–15.5)
WBC: 8.9 K/uL (ref 4.0–10.5)
nRBC: 0 % (ref 0.0–0.2)

## 2024-04-11 LAB — WET PREP, GENITAL
Clue Cells Wet Prep HPF POC: NONE SEEN
Sperm: NONE SEEN
Trich, Wet Prep: NONE SEEN
WBC, Wet Prep HPF POC: >=10 — AB
Yeast Wet Prep HPF POC: NONE SEEN

## 2024-04-11 LAB — URINALYSIS, MICROSCOPIC (REFLEX)
RBC / HPF: >50 RBC/hpf (ref 0–5)
WBC, UA: >50 WBC/hpf (ref 0–5)

## 2024-04-11 MED ORDER — LACTATED RINGERS IV BOLUS
1000.0000 mL | Freq: Once | INTRAVENOUS | Status: AC
  Administered 2024-04-11: 1000 mL via INTRAVENOUS

## 2024-04-11 MED ORDER — HYDROMORPHONE HCL 2 MG PO TABS
2.0000 mg | ORAL_TABLET | Freq: Once | ORAL | Status: AC
  Administered 2024-04-11: 2 mg via ORAL
  Filled 2024-04-11: qty 1

## 2024-04-11 MED ORDER — CEFADROXIL 500 MG PO CAPS: 500.0000 mg | ORAL_CAPSULE | Freq: Two times a day (BID) | ORAL | 0 refills | Status: AC

## 2024-04-11 MED ORDER — HYDROMORPHONE HCL 1 MG/ML IJ SOLN
0.5000 mg | Freq: Once | INTRAMUSCULAR | Status: AC
  Administered 2024-04-11: 0.5 mg via INTRAVENOUS
  Filled 2024-04-11: qty 1
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
## 2024-04-11 NOTE — MAU Note (Signed)
 Alyssa Howell is a 23 y.o. at Unknown here in MAU reporting: she's having brown/green vaginal discharge that began today.  Reports discharge has a strong odor but not like fish.  Reports has pain at umbilicus that began last night.  Reports the pain is stabbing @ umbilicus but cramping in lower abdomen  LMP: NA Onset of complaint: today Pain score: 8 Vitals:   04/11/24 1552  BP: 122/75  Pulse: (!) 111  Resp: 20  Temp: 98.6 F (37 C)  SpO2: 98%     FHT: NA  Lab orders placed from triage: None

## 2024-04-14 ENCOUNTER — Encounter (HOSPITAL_COMMUNITY): Payer: Self-pay | Admitting: Urology

## 2024-04-14 ENCOUNTER — Encounter (HOSPITAL_COMMUNITY): Payer: Self-pay | Admitting: Medical

## 2024-04-14 ENCOUNTER — Encounter: Admission: RE | Disposition: A | Payer: Self-pay | Attending: Urology

## 2024-04-14 ENCOUNTER — Ambulatory Visit (HOSPITAL_COMMUNITY)

## 2024-04-14 ENCOUNTER — Ambulatory Visit (HOSPITAL_COMMUNITY)
Admission: RE | Admit: 2024-04-14 | Discharge: 2024-04-14 | Disposition: A | Discharge status: Home / Self Care | Source: Ambulatory Visit | Attending: Urology | Admitting: Urology

## 2024-04-14 ENCOUNTER — Ambulatory Visit (HOSPITAL_COMMUNITY): Payer: Self-pay

## 2024-04-14 DIAGNOSIS — N201 Calculus of ureter: Secondary | ICD-10-CM | POA: Diagnosis present

## 2024-04-14 DIAGNOSIS — N132 Hydronephrosis with renal and ureteral calculous obstruction: Secondary | ICD-10-CM | POA: Insufficient documentation

## 2024-04-14 DIAGNOSIS — N202 Calculus of kidney with calculus of ureter: Secondary | ICD-10-CM

## 2024-04-14 HISTORY — PX: CYSTOSCOPY/URETEROSCOPY/HOLMIUM LASER/STENT PLACEMENT: SHX6546

## 2024-04-14 HISTORY — PX: CYSTOSCOPY W/ RETROGRADES: SHX1426

## 2024-04-14 LAB — GC/CHLAMYDIA PROBE AMP (~~LOC~~) NOT AT ARMC
Chlamydia: NEGATIVE
Comment: NEGATIVE
Comment: NORMAL
Neisseria Gonorrhea: NEGATIVE

## 2024-04-14 LAB — POCT PREGNANCY, URINE: Preg Test, Ur: NEGATIVE

## 2024-04-14 SURGERY — CYSTOSCOPY/URETEROSCOPY/HOLMIUM LASER/STENT PLACEMENT
Anesthesia: General | Laterality: Left

## 2024-04-14 MED ORDER — PROPOFOL 10 MG/ML IV BOLUS
INTRAVENOUS | Status: AC
  Filled 2024-04-14: qty 20

## 2024-04-14 MED ORDER — SODIUM CHLORIDE 0.9 % IR SOLN
Status: DC | PRN
  Administered 2024-04-14: 3000 mL via INTRAVESICAL

## 2024-04-14 MED ORDER — OXYCODONE HCL 5 MG PO TABS
5.0000 mg | ORAL_TABLET | Freq: Once | ORAL | Status: AC | PRN
  Administered 2024-04-14: 5 mg via ORAL

## 2024-04-14 MED ORDER — PHENYLEPHRINE 80 MCG/ML (10ML) SYRINGE FOR IV PUSH (FOR BLOOD PRESSURE SUPPORT)
PREFILLED_SYRINGE | INTRAVENOUS | Status: DC | PRN
  Administered 2024-04-14: 100 ug via INTRAVENOUS
  Administered 2024-04-14 (×3): 80 ug via INTRAVENOUS
  Administered 2024-04-14: 100 ug via INTRAVENOUS

## 2024-04-14 MED ORDER — ACETAMINOPHEN 10 MG/ML IV SOLN
INTRAVENOUS | Status: AC
  Filled 2024-04-14: qty 100

## 2024-04-14 MED ORDER — IOHEXOL 300 MG/ML  SOLN
INTRAMUSCULAR | Status: DC | PRN
  Administered 2024-04-14: 7 mL

## 2024-04-14 MED ORDER — DEXAMETHASONE SOD PHOSPHATE PF 10 MG/ML IJ SOLN
INTRAMUSCULAR | Status: DC | PRN
  Administered 2024-04-14: 5 mg via INTRAVENOUS

## 2024-04-14 MED ORDER — OXYCODONE HCL 5 MG/5ML PO SOLN: 5.0000 mg | Freq: Once | ORAL | Status: AC | PRN

## 2024-04-14 MED ORDER — ACETAMINOPHEN 10 MG/ML IV SOLN
INTRAVENOUS | Status: DC | PRN
  Administered 2024-04-14: 1000 mg via INTRAVENOUS

## 2024-04-14 MED ORDER — FENTANYL CITRATE (PF) 50 MCG/ML IJ SOSY
PREFILLED_SYRINGE | INTRAMUSCULAR | Status: AC
  Filled 2024-04-14: qty 1

## 2024-04-14 MED ORDER — OXYCODONE HCL 5 MG PO TABS
ORAL_TABLET | ORAL | Status: AC
  Filled 2024-04-14: qty 1

## 2024-04-14 MED ORDER — LIDOCAINE HCL (CARDIAC) PF 100 MG/5ML IV SOSY
PREFILLED_SYRINGE | INTRAVENOUS | Status: DC | PRN
  Administered 2024-04-14: 100 mg via INTRAVENOUS

## 2024-04-14 MED ORDER — ORAL CARE MOUTH RINSE: 15.0000 mL | Freq: Once | OROMUCOSAL | Status: AC

## 2024-04-14 MED ORDER — ONDANSETRON HCL 4 MG/2ML IJ SOLN
INTRAMUSCULAR | Status: AC
  Filled 2024-04-14: qty 2

## 2024-04-14 MED ORDER — OXYCODONE HCL 5 MG PO TABS
5.0000 mg | ORAL_TABLET | Freq: Once | ORAL | Status: AC
  Administered 2024-04-14: 5 mg via ORAL

## 2024-04-14 MED ORDER — PROPOFOL 10 MG/ML IV BOLUS
INTRAVENOUS | Status: DC | PRN
  Administered 2024-04-14: 200 mg via INTRAVENOUS

## 2024-04-14 MED ORDER — PHENYLEPHRINE 80 MCG/ML (10ML) SYRINGE FOR IV PUSH (FOR BLOOD PRESSURE SUPPORT)
PREFILLED_SYRINGE | INTRAVENOUS | Status: AC
  Filled 2024-04-14: qty 10

## 2024-04-14 MED ORDER — FENTANYL CITRATE (PF) 100 MCG/2ML IJ SOLN
INTRAMUSCULAR | Status: AC
  Filled 2024-04-14: qty 2

## 2024-04-14 MED ORDER — FENTANYL CITRATE (PF) 100 MCG/2ML IJ SOLN
INTRAMUSCULAR | Status: DC | PRN
  Administered 2024-04-14: 50 ug via INTRAVENOUS
  Administered 2024-04-14 (×2): 25 ug via INTRAVENOUS

## 2024-04-14 MED ORDER — ACETAMINOPHEN 10 MG/ML IV SOLN: 1000.0000 mg | Freq: Once | INTRAVENOUS | Status: DC | PRN

## 2024-04-14 MED ORDER — FENTANYL CITRATE (PF) 50 MCG/ML IJ SOSY
25.0000 ug | PREFILLED_SYRINGE | INTRAMUSCULAR | Status: DC | PRN
  Administered 2024-04-14 (×3): 50 ug via INTRAVENOUS

## 2024-04-14 MED ORDER — LACTATED RINGERS IV SOLN: INTRAVENOUS | Status: DC

## 2024-04-14 MED ORDER — DROPERIDOL 2.5 MG/ML IJ SOLN: 0.6250 mg | Freq: Once | INTRAMUSCULAR | Status: DC | PRN

## 2024-04-14 MED ORDER — MIDAZOLAM HCL 5 MG/5ML IJ SOLN
INTRAMUSCULAR | Status: DC | PRN
  Administered 2024-04-14: 2 mg via INTRAVENOUS

## 2024-04-14 MED ORDER — OXYCODONE-ACETAMINOPHEN 5-325 MG PO TABS: 1.0000 | ORAL_TABLET | ORAL | 0 refills | Status: AC | PRN

## 2024-04-14 MED ORDER — LIDOCAINE HCL (PF) 2 % IJ SOLN
INTRAMUSCULAR | Status: AC
  Filled 2024-04-14: qty 5

## 2024-04-14 MED ORDER — CEFAZOLIN SODIUM-DEXTROSE 2-4 GM/100ML-% IV SOLN
2.0000 g | INTRAVENOUS | Status: AC
  Administered 2024-04-14: 2 g via INTRAVENOUS
  Filled 2024-04-14: qty 100

## 2024-04-14 MED ORDER — ONDANSETRON HCL 4 MG/2ML IJ SOLN
INTRAMUSCULAR | Status: DC | PRN
  Administered 2024-04-14: 4 mg via INTRAVENOUS

## 2024-04-14 MED ORDER — MIDAZOLAM HCL 2 MG/2ML IJ SOLN
INTRAMUSCULAR | Status: AC
  Filled 2024-04-14: qty 2

## 2024-04-14 MED ORDER — 0.9 % SODIUM CHLORIDE (POUR BTL) OPTIME
TOPICAL | Status: DC | PRN
  Administered 2024-04-14: 1000 mL

## 2024-04-14 MED ORDER — CHLORHEXIDINE GLUCONATE 0.12 % MT SOLN
15.0000 mL | Freq: Once | OROMUCOSAL | Status: AC
  Administered 2024-04-14: 15 mL via OROMUCOSAL

## 2024-04-14 SURGICAL SUPPLY — 24 items
BAG URO CATCHER STRL LF (MISCELLANEOUS) ×1 IMPLANT
BASKET ZERO TIP NITINOL 2.4FR (BASKET) IMPLANT
BENZOIN TINCTURE PRP APPL 2/3 (GAUZE/BANDAGES/DRESSINGS) IMPLANT
CATH URETERAL DUAL LUMEN 10F (MISCELLANEOUS) IMPLANT
CATH URETL OPEN 5X70 (CATHETERS) IMPLANT
CATH URETL OPEN END 6FR 70 (CATHETERS) IMPLANT
CLOTH BEACON ORANGE TIMEOUT ST (SAFETY) ×1 IMPLANT
DRSG TEGADERM 2-3/8X2-3/4 SM (GAUZE/BANDAGES/DRESSINGS) IMPLANT
FIBER LASER MOSES 200 DFL (Laser) IMPLANT
GLOVE BIOGEL M 7.0 STRL (GLOVE) ×1 IMPLANT
GOWN STRL REUS W/ TWL XL LVL3 (GOWN DISPOSABLE) ×1 IMPLANT
GUIDEWIRE STR DUAL SENSOR (WIRE) ×2 IMPLANT
GUIDEWIRE ZIPWRE .038 STRAIGHT (WIRE) IMPLANT
KIT TURNOVER KIT A (KITS) ×1 IMPLANT
MANIFOLD NEPTUNE II (INSTRUMENTS) ×1 IMPLANT
NS IRRIG 1000ML POUR BTL (IV SOLUTION) IMPLANT
PACK CYSTO (CUSTOM PROCEDURE TRAY) ×1 IMPLANT
PAD PREP 24X48 CUFFED NSTRL (MISCELLANEOUS) ×1 IMPLANT
SHEATH DILATOR SET 8/10 (MISCELLANEOUS) IMPLANT
SHEATH NAVIGATOR HD 11/13X28 (SHEATH) IMPLANT
SHEATH NAVIGATOR HD 11/13X36 (SHEATH) IMPLANT
STENT URET 6FRX24 CONTOUR (STENTS) IMPLANT
TUBING CONNECTING 10 (TUBING) ×1 IMPLANT
TUBING UROLOGY SET (TUBING) ×1 IMPLANT

## 2024-04-14 NOTE — Anesthesia Postprocedure Evaluation (Signed)
"   Anesthesia Post Note  Patient: Alyssa Howell  Procedure(s) Performed: CYSTOSCOPY/URETEROSCOPY/HOLMIUM LASER/STENT PLACEMENT (Left) CYSTOSCOPY, WITH RETROGRADE PYELOGRAM (Left)     Patient location during evaluation: PACU Anesthesia Type: General Level of consciousness: awake and alert Pain management: pain level controlled Vital Signs Assessment: post-procedure vital signs reviewed and stable Respiratory status: spontaneous breathing, nonlabored ventilation, respiratory function stable and patient connected to nasal cannula oxygen Cardiovascular status: blood pressure returned to baseline and stable Postop Assessment: no apparent nausea or vomiting Anesthetic complications: no   No notable events documented.  Last Vitals:  Vitals:   04/14/24 1115 04/14/24 1130  BP: 126/75 120/77  Pulse: (!) 59 77  Resp: 12 19  Temp:    SpO2: 98% 99%    Last Pain:  Vitals:   04/14/24 1130  TempSrc:   PainSc: Asleep                 Rome Ade      "
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
## 2024-04-14 NOTE — Discharge Instructions (Signed)
 [facility] [phone] Post Ureteroscopy With or Without Stent Instructions  Definitions:  Ureter: The duct that transports urine from the kidney to the bladder. Stent:   A plastic hollow tube that is placed into the ureter, from the kidney to the bladder to prevent the ureter from swelling shut.  GENERAL INSTRUCTIONS:  Despite the fact that no skin incisions were used, the area around the ureter and bladder is raw and irritated. The stent is a foreign body which will further irritate the bladder wall. This irritation is manifested by increased frequency of urination, both day and night, and by an increase in the urge to urinate. In some, the urge to urinate is present almost always. Sometimes the urge is strong enough that you may not be able to stop yourself from urinating. The only real cure is to remove the stent and then give time for the bladder wall to heal which can't be done until the danger of the ureter swelling shut has passed, which varies.  You may see some blood in your urine while the stent is in place and a few days afterwards. Do not be alarmed, even if the urine was clear for a while. Get off your feet and drink lots of fluids until clearing occurs. If you start to pass clots or don't improve, call us .  DIET: You may return to your normal diet immediately. Because of the raw surface of your bladder, alcohol, spicy foods, acid type foods and drinks with caffeine  may cause irritation or frequency and should be used in moderation. To keep your urine flowing freely and to avoid constipation, drink plenty of fluids during the day ( 8-10 glasses ). Tip: Avoid cranberry juice because it is very acidic.  ACTIVITY: Your physical activity doesn't need to be restricted. However, if you are very active, you may see some blood in your urine. We suggest that you reduce your activity under these circumstances until the bleeding has stopped.  BOWELS: It is important to  keep your bowels regular during the postoperative period. Straining with bowel movements can cause bleeding. A bowel movement every other day is reasonable. Use a mild laxative if needed, such as Milk of Magnesia 2-3 tablespoons, or 2 Dulcolax tablets. Call if you continue to have problems. If you have been taking narcotics for pain, before, during or after your surgery, you may be constipated. Take a laxative if necessary.   MEDICATION: You should resume your pre-surgery medications unless told not to. In addition you will often be given an antibiotic to prevent infection. These should be taken as prescribed until the bottles are finished unless you are having an unusual reaction to one of the drugs.  PROBLEMS YOU SHOULD REPORT TO US : Fevers over 100.5 Fahrenheit. Heavy bleeding, or clots ( See above notes about blood in urine ). Inability to urinate. Drug reactions ( hives, rash, nausea, vomiting, diarrhea ). Severe burning or pain with urination that is not improving.  FOLLOW-UP: You will need a follow-up appointment to monitor your progress. Call for this appointment at the number listed above. Usually the first appointment will be about three to fourteen days after your surgery.  You have a stent draining your kidney and this may be removed in 3 to 4 days by pulling string taped to thigh.

## 2024-04-14 NOTE — Op Note (Signed)
 Operative Note  Preoperative diagnosis:  1.  Left ureteral and renal stones  Postoperative diagnosis: 1.  Left ureteral and renal stones  Procedure(s): 1.  Cystoscopy 2.  Left ureteroscopy with laser lithotripsy and basket extraction of stones 3.  Left retrograde pyelogram 4.  Left ureteral stent exchange 5. Fluoroscopy with intraoperative interpretation  Surgeon: Donnice Siad, MD  Assistants:  None  Anesthesia:  General  Complications:  None  EBL:  Minimal  Specimens: 1. Stones for stone analysis (to be done at [facility])  Drains/Catheters: 1.  Left 6Fr x 24cm ureteral stent with tether string  Intraoperative findings:   Cystoscopy demonstrated no suspicious bladder lesions. Left retrograde pyelogram revealed moderate left hydronephrosis. Left ureteroscopy demonstrated distal 8 m ureteral stone as well as 3 separate left renal stones about 2 to 3 mm each removed. Successful stent placement.  Indication:  Alyssa Howell is a 23 y.o. female with history of distal left ureteral stone and left renal stones here for definitive treatment of stones.  Description of procedure: After informed consent was obtained from the patient, the patient was identified and taken to the operating room and placed in the supine position.  General anesthesia was administered as well as perioperative IV antibiotics.  At the beginning of the case, a time-out was performed to properly identify the patient, the surgery to be performed, and the surgical site.  Sequential compression devices were applied to the lower extremities at the beginning of the case for DVT prophylaxis.  The patient was then placed in the dorsal lithotomy supine position, prepped and draped in sterile fashion.  We then passed the 21-French rigid cystoscope through the urethra and into the bladder under vision without any difficulty, noting a normal urethra without strictures.  A systematic evaluation of the bladder revealed  no evidence of any suspicious bladder lesions.  Ureteral orifices were in normal position.    The distal aspect of the ureteral stent was seen protruding from the left ureteral orifice.  We then used the alligator-tooth forceps and grasped the distal end of the ureteral stent and brought it out the urethral meatus while watching the proximal coil straighten out nicely on fluoroscopy. Through the ureteral stent, we then passed a 0.038 sensor wire up to the level of the renal pelvis.  The ureteral stent was then removed, leaving the sensor wire up the left ureter.    Under cystoscopic and flouroscopic guidance, we cannulated the left ureteral orifice with a 5-French open-ended ureteral catheter and a gentle retrograde pyelogram was performed, revealing a normal caliber ureter without any filling defects. There was moderate hydronephrosis of the collecting system. A 0.038 sensor wire was then passed up to the level of the renal pelvis and secured to the drape as a safety wire. The ureteral catheter and cystoscope were removed, leaving the safety wire in place.   A semi-rigid ureteroscope was passed alongside the wire up the distal ureter which appeared normal.  I encountered a distal left 8 mm stone.  Using a 200 m holmium laser fiber, the stone was fragmented completely and ZeroTip basket was used to remove all fragments.  I then advanced the scope in the proximal left ureter encountering no further stones.  I passed a separate 0.038 sensor wire in the kidney over this wire, passed a single-lumen flexible ureteroscope.  I surveyed the kidney and encountered 3 separate small stones that were each basket extracted.  With the ureteroscope in the kidney, a gentle pyelogram was performed  to delineate the calyceal system and we evaluated the calyces systematically.  We encountered no further stones.  We then withdrew the ureteroscope back down the ureter noting no evidence of any stones along the course of the  ureter.  Prior to removing the ureteroscope, we did pass the Glidewire back up to the ureter to the renal pelvis.  Once the ureteroscope was removed, we then used the Glidewire under fluoroscopic guidance and passed up a 6-French x 24 cm double-pigtail ureteral stent up the ureter, making sure that the proximal and distal ends coiled within the kidney and bladder respectively.  Note that we left a long tether string attached to the distal end of the ureteral stent and it exited the urethral meatus and was secured to the penile shaft/ inner thigh with a tegaderm adhesive.  The cystoscope was then advanced back into the bladder under vision.  We were able to see the distal stent coiling nicely within the bladder.  The bladder was then emptied with irrigation solution.  The cystoscope was then removed.    The patient tolerated the procedure well and there was no complication. Patient was awoken from anesthesia and taken to the recovery room in stable condition. I was present and scrubbed for the entirety of the case.  Plan:  Patient will be discharged home.  She will follow-up in 1 month with renal ultrasound prior.   Matt R. Kory Panjwani MD [facility]  Pager: [phone]

## 2024-04-14 NOTE — Anesthesia Procedure Notes (Signed)
 Procedure Name: LMA Insertion Date/Time: 04/14/2024 9:57 AM  Performed by: Belvie Valri NOVAK, CRNAPre-anesthesia Checklist: Patient identified, Emergency Drugs available, Suction available and Patient being monitored Patient Re-evaluated:Patient Re-evaluated prior to induction Oxygen Delivery Method: Circle System Utilized Preoxygenation: Pre-oxygenation with 100% oxygen Induction Type: IV induction LMA: LMA inserted LMA Size: 4.0 Number of attempts: 1 Placement Confirmation: positive ETCO2 Tube secured with: Tape Dental Injury: Teeth and Oropharynx as per pre-operative assessment

## 2024-04-14 NOTE — Transfer of Care (Signed)
 Immediate Anesthesia Transfer of Care Note  Patient: Alyssa Howell  Procedure(s) Performed: CYSTOSCOPY/URETEROSCOPY/HOLMIUM LASER/STENT PLACEMENT (Left) CYSTOSCOPY, WITH RETROGRADE PYELOGRAM (Left)  Patient Location: PACU  Anesthesia Type:General  Level of Consciousness: awake  Airway & Oxygen Therapy: Patient Spontanous Breathing  Post-op Assessment: Report given to RN and Post -op Vital signs reviewed and stable  Post vital signs: Reviewed and stable  Last Vitals:  Vitals Value Taken Time  BP 115/74 04/14/24 10:41  Temp    Pulse    Resp 14 04/14/24 10:42  SpO2    Vitals shown include unfiled device data.  Last Pain:  Vitals:   04/14/24 0833  TempSrc: Oral  PainSc: 5       Patients Stated Pain Goal: 0 (04/14/24 9166)  Complications: No notable events documented.

## 2024-04-15 ENCOUNTER — Inpatient Hospital Stay (HOSPITAL_COMMUNITY): Admission: AD | Admit: 2024-04-15 | Payer: Self-pay | Source: Home / Self Care

## 2024-04-15 ENCOUNTER — Encounter (HOSPITAL_COMMUNITY): Payer: Self-pay | Admitting: Urology

## 2024-04-15 ENCOUNTER — Inpatient Hospital Stay (HOSPITAL_COMMUNITY)

## 2024-04-21 ENCOUNTER — Other Ambulatory Visit: Payer: Self-pay

## 2024-04-21 MED ORDER — VALACYCLOVIR HCL 1 G PO TABS: 1000.0000 mg | ORAL_TABLET | Freq: Every day | ORAL | 0 refills | Status: AC

## 2024-05-13 ENCOUNTER — Ambulatory Visit: Admitting: Advanced Practice Midwife
